# Patient Record
Sex: Female | Born: 1950
Health system: Southern US, Community
[De-identification: ages and names within clinical notes are randomized; demographics above are authoritative.]

## PROBLEM LIST (undated history)

## (undated) DIAGNOSIS — Z87891 Personal history of nicotine dependence: Secondary | ICD-10-CM

## (undated) DIAGNOSIS — H9193 Unspecified hearing loss, bilateral: Secondary | ICD-10-CM

## (undated) DIAGNOSIS — J432 Centrilobular emphysema: Secondary | ICD-10-CM

## (undated) DIAGNOSIS — M8580 Other specified disorders of bone density and structure, unspecified site: Secondary | ICD-10-CM

## (undated) DIAGNOSIS — F32A Depression, unspecified: Secondary | ICD-10-CM

## (undated) DIAGNOSIS — C44300 Unspecified malignant neoplasm of skin of unspecified part of face: Secondary | ICD-10-CM

## (undated) DIAGNOSIS — N182 Chronic kidney disease, stage 2 (mild): Principal | ICD-10-CM

## (undated) DIAGNOSIS — G4700 Insomnia, unspecified: Secondary | ICD-10-CM

## (undated) DIAGNOSIS — K5900 Constipation, unspecified: Secondary | ICD-10-CM

## (undated) DIAGNOSIS — N189 Chronic kidney disease, unspecified: Secondary | ICD-10-CM

## (undated) DIAGNOSIS — K219 Gastro-esophageal reflux disease without esophagitis: Secondary | ICD-10-CM

## (undated) DIAGNOSIS — I700 Atherosclerosis of aorta: Secondary | ICD-10-CM

## (undated) DIAGNOSIS — F3341 Major depressive disorder, recurrent, in partial remission: Secondary | ICD-10-CM

## (undated) DIAGNOSIS — E785 Hyperlipidemia, unspecified: Secondary | ICD-10-CM

## (undated) DIAGNOSIS — F329 Major depressive disorder, single episode, unspecified: Secondary | ICD-10-CM

## (undated) HISTORY — DX: Unspecified malignant neoplasm of skin of unspecified part of face: C44.300

## (undated) HISTORY — DX: Centrilobular emphysema: J43.2

## (undated) HISTORY — DX: Atherosclerosis of aorta: I70.0

## (undated) HISTORY — DX: Major depressive disorder, recurrent, in partial remission: F33.41

## (undated) HISTORY — DX: Other specified disorders of bone density and structure, unspecified site: M85.80

## (undated) HISTORY — DX: Chronic kidney disease, stage 2 (mild): N18.2

## (undated) HISTORY — DX: Personal history of nicotine dependence: Z87.891

## (undated) HISTORY — DX: Unspecified hearing loss, bilateral: H91.93

## (undated) HISTORY — DX: Insomnia, unspecified: G47.00

---

## 1976-08-07 HISTORY — PX: BACK SURGERY: SHX140

## 1978-08-07 HISTORY — PX: ABDOMINAL HYSTERECTOMY: SHX81

## 2011-08-08 DIAGNOSIS — C44300 Unspecified malignant neoplasm of skin of unspecified part of face: Secondary | ICD-10-CM

## 2011-08-08 HISTORY — DX: Unspecified malignant neoplasm of skin of unspecified part of face: C44.300

## 2011-08-08 HISTORY — PX: TONSILLECTOMY: SUR1361

## 2011-08-08 HISTORY — PX: INNER EAR SURGERY: SHX679

## 2012-06-12 ENCOUNTER — Ambulatory Visit: Payer: Self-pay | Admitting: Family

## 2012-06-17 ENCOUNTER — Ambulatory Visit: Payer: Self-pay | Admitting: Otolaryngology

## 2012-09-13 ENCOUNTER — Ambulatory Visit: Payer: Self-pay | Admitting: Family

## 2012-11-01 ENCOUNTER — Ambulatory Visit: Payer: Self-pay | Admitting: Otolaryngology

## 2012-11-14 ENCOUNTER — Ambulatory Visit: Payer: Self-pay | Admitting: Otolaryngology

## 2013-03-20 ENCOUNTER — Ambulatory Visit: Payer: Self-pay | Admitting: Otolaryngology

## 2013-04-02 ENCOUNTER — Ambulatory Visit: Payer: Self-pay | Admitting: Otolaryngology

## 2013-04-03 LAB — PATHOLOGY REPORT

## 2013-06-13 ENCOUNTER — Ambulatory Visit: Payer: Self-pay | Admitting: Internal Medicine

## 2013-07-07 ENCOUNTER — Ambulatory Visit: Payer: Self-pay | Admitting: Family

## 2013-10-16 ENCOUNTER — Ambulatory Visit: Payer: Self-pay | Admitting: Otolaryngology

## 2014-06-16 ENCOUNTER — Ambulatory Visit: Payer: Self-pay | Admitting: Internal Medicine

## 2014-07-21 ENCOUNTER — Ambulatory Visit: Payer: Self-pay | Admitting: Family Medicine

## 2014-10-19 ENCOUNTER — Ambulatory Visit: Payer: Self-pay | Admitting: Otolaryngology

## 2014-11-27 NOTE — Op Note (Signed)
PATIENT NAME:  Robin Jones, Robin Jones MR#:  [id number] DATE OF BIRTH:  [birth year]  DATE OF PROCEDURE:  04/02/2013  PREOPERATIVE DIAGNOSIS: Left cholesteatoma with chronic suppurative otitis.   POSTOPERATIVE DIAGNOSIS: Left cholesteatoma with chronic suppurative otitis.   PROCEDURE: Left tympanoplasty with ossicular chain reconstruction and removal of cholesteatoma.   SURGEON: Malon Kindle, MD.   ANESTHESIA: General endotracheal.   INDICATIONS: A 64 year old female with a history of left cholesteatoma and persistently draining ear.   FINDINGS: The patient had a small retraction pocket posteriorly and superiorly. There appeared to be a small atticotomy defect where she had previous surgery potentially. The malleus was either eroded or had been partially removed although the head of the malleus remained. All of the cholesteatoma was noted anteriorly in the middle ear anterior to the head of the malleus and just anterior to the stapes as well as in the region of the protympanum. This required removal of the incus and malleus for complete removal of the cholesteatoma itself. The ossicles were reconstructed with an incus transposition graft.   COMPLICATIONS: None.   DESCRIPTION OF PROCEDURE: After obtaining informed consent, the patient was taken to the operating room and placed in the supine position. After induction of general endotracheal anesthesia, the patient was turned 90 degrees. The facial nerve monitor electrodes were placed in the usual fashion and proper functioning confirmed. Lidocaine 1% with epinephrine 1:100,000 was injected into the canal skin as well as in the postauricular crease region. She was then prepped and draped in the usual sterile fashion. The left ear was evaluated under the operating microscope. Canal incisions were made at 12 and 6 o'clock vertically followed by a horizontal canal incision about 4 mm behind the annulus. The posterior canal flap was elevated down to the annulus. A 15  blade was used to incise the skin posteriorly carrying the incision down to the mastoid cortex. Temporalis fascia was harvested and set aside to be pressed and dried for later use as a graft. The periosteum was then elevated off the mastoid with a Lempert elevator and dissection proceeded down to the canal using a Soil scientist until the previously made canal cuts were encountered. The ear was then retracted forward using a Penrose drain and a self retainer. A Lempert  was then used to further elevate the posterior canal flap and the annulus. A Rosen needle was also used to aid in elevating the annulus posteriorly. A Whirlybird was then used to elevate the tympanic membrane remnant out of the middle ear carefully dissecting it away from the incus and stapes complex to which it was scarred as well as the chorda tympani nerve. Just anterior to the incus stapes complex there was cholesteatoma. The long process of the malleus was either eroded or had been previously removed. Dissection proceeded more anteriorly into the protympanum and mesotympanum where there was severe  retraction of the tympanic membrane which had settled into nooks and crannies in these regions. It was clear that there was disease anterior to the malleus and incus complex so the decision was made to remove the incus and the head of the malleus. The incudostapedial joint was divided with a joint knife and the incus separated from the head of the malleus with a right angle pick. The incus was then removed to be preserved for use as a graft. The malleus head was separated from the tensor tympani tendon and removed. Further cholesteatoma, was then dissected out of the anterior aspect of the epitympanum. Meticulous efforts were  made to remove all skin from the middle ear particularly anteriorly and in the region of the eustachian tube where the tympanic membrane was retracted severely. There was no usable tympanic membrane remnant anteriorly. The  decision was made to elevate the anterior canal skin making a horizontal incision anteriorly in the skin just before the curvature of the canal and elevating the skin down to the annulus. A canaloplasty was then performed to provide better visualization of the anterior canal angle using a #3 Diamond bur. Care was taken not to enter the temporomandibular joint. With the anterior canal angle better visualize, the annulus was raised anteriorly to allow a graft to be pulled up under the annulus. The temporalis fascia graft was then prepared and placed in position, sliding it up anterior to the annulus along the anterior canal wall so that the annulus would hold it in position anteriorly. A piece of concha cartilage was then harvested and used to bolster anteriorly so that there would not be further development of retraction pockets in this area. The incus was reshaped removing much of the long process of the incus and creating a small socket for the head of the stapes. The incus was then placed over the head of the stapes to provide good contact between the stapes and we reconstructed tympanic membrane. A small amount of cartilage was also placed in the attic defect to prevent further retraction and there. The middle ear was then carefully packed with Floxin moistened Gelfoam making sure that the incus transposition graft remained in good position. The tympanic membrane remnant and grafts were then placed into anatomic position and packing placed lateral to the tympanic membrane. A small piece of Gelfilm was placed first to try to reduce issues with blunting at the anterior canal angle. The anterior canal skin was placed back into anatomic position as was the posterior canal skin and the canal completely packed with Gelfoam followed by bacitracin ointment. The posterior auricular incision was closed with 4-0 Vicryl suture for the deep closure and 50 fast absorbing gut suture in a running lock stitch for the skin. Next,  a Glasscock dressing was placed. The patient was then returned to the anesthesiologist for awakening. She was awakened and taken to the recovery room in good condition postoperatively. Blood loss was minimal.    ____________________________ Sammuel Hines. Richardson Landry, MD psb:dp D: 04/02/2013 11:17:50 ET T: 04/02/2013 11:59:45 ET JOB#: [id number]  cc: Sammuel Hines. Richardson Landry, MD, <Dictator> Riley Nearing MD ELECTRONICALLY SIGNED 04/09/2013 17:35

## 2014-12-22 ENCOUNTER — Ambulatory Visit: Payer: Medicare Other | Admitting: Anesthesiology

## 2014-12-22 ENCOUNTER — Ambulatory Visit
Admission: RE | Admit: 2014-12-22 | Discharge: 2014-12-22 | Disposition: A | Discharge status: Home / Self Care | Payer: Medicare Other | Source: Ambulatory Visit | Attending: Gastroenterology | Admitting: Gastroenterology

## 2014-12-22 ENCOUNTER — Encounter
Admission: RE | Disposition: A | Discharge status: Home / Self Care | Payer: Self-pay | Source: Ambulatory Visit | Attending: Gastroenterology

## 2014-12-22 ENCOUNTER — Encounter: Payer: Self-pay | Admitting: *Deleted

## 2014-12-22 DIAGNOSIS — K5730 Diverticulosis of large intestine without perforation or abscess without bleeding: Secondary | ICD-10-CM | POA: Diagnosis not present

## 2014-12-22 DIAGNOSIS — I129 Hypertensive chronic kidney disease with stage 1 through stage 4 chronic kidney disease, or unspecified chronic kidney disease: Secondary | ICD-10-CM | POA: Diagnosis not present

## 2014-12-22 DIAGNOSIS — K640 First degree hemorrhoids: Secondary | ICD-10-CM | POA: Insufficient documentation

## 2014-12-22 DIAGNOSIS — F329 Major depressive disorder, single episode, unspecified: Secondary | ICD-10-CM | POA: Diagnosis not present

## 2014-12-22 DIAGNOSIS — D124 Benign neoplasm of descending colon: Secondary | ICD-10-CM | POA: Diagnosis not present

## 2014-12-22 DIAGNOSIS — Z1211 Encounter for screening for malignant neoplasm of colon: Secondary | ICD-10-CM | POA: Diagnosis present

## 2014-12-22 DIAGNOSIS — J449 Chronic obstructive pulmonary disease, unspecified: Secondary | ICD-10-CM | POA: Diagnosis not present

## 2014-12-22 DIAGNOSIS — K219 Gastro-esophageal reflux disease without esophagitis: Secondary | ICD-10-CM | POA: Insufficient documentation

## 2014-12-22 DIAGNOSIS — K5900 Constipation, unspecified: Secondary | ICD-10-CM | POA: Diagnosis not present

## 2014-12-22 DIAGNOSIS — F17210 Nicotine dependence, cigarettes, uncomplicated: Secondary | ICD-10-CM | POA: Diagnosis not present

## 2014-12-22 DIAGNOSIS — E785 Hyperlipidemia, unspecified: Secondary | ICD-10-CM | POA: Diagnosis not present

## 2014-12-22 DIAGNOSIS — D122 Benign neoplasm of ascending colon: Secondary | ICD-10-CM | POA: Diagnosis not present

## 2014-12-22 HISTORY — DX: Depression, unspecified: F32.A

## 2014-12-22 HISTORY — DX: Gastro-esophageal reflux disease without esophagitis: K21.9

## 2014-12-22 HISTORY — DX: Chronic kidney disease, unspecified: N18.9

## 2014-12-22 HISTORY — PX: COLONOSCOPY: SHX5424

## 2014-12-22 HISTORY — DX: Constipation, unspecified: K59.00

## 2014-12-22 HISTORY — DX: Hyperlipidemia, unspecified: E78.5

## 2014-12-22 HISTORY — DX: Major depressive disorder, single episode, unspecified: F32.9

## 2014-12-22 SURGERY — COLONOSCOPY
Anesthesia: General

## 2014-12-22 MED ORDER — SODIUM CHLORIDE 0.9 % IV SOLN
INTRAVENOUS | Status: DC
  Administered 2014-12-22: 09:00:00 via INTRAVENOUS

## 2014-12-22 MED ORDER — FENTANYL CITRATE (PF) 100 MCG/2ML IJ SOLN: 25.0000 ug | INTRAMUSCULAR | Status: DC | PRN

## 2014-12-22 MED ORDER — PROPOFOL 10 MG/ML IV BOLUS
INTRAVENOUS | Status: DC | PRN
  Administered 2014-12-22: 50 mg via INTRAVENOUS
  Administered 2014-12-22: 150 mg via INTRAVENOUS

## 2014-12-22 MED ORDER — ONDANSETRON HCL 4 MG/2ML IJ SOLN
4.0000 mg | Freq: Once | INTRAMUSCULAR | Status: AC
  Administered 2014-12-22: 4 mg via INTRAVENOUS

## 2014-12-22 MED ORDER — SUCCINYLCHOLINE CHLORIDE 20 MG/ML IJ SOLN
INTRAMUSCULAR | Status: DC | PRN
  Administered 2014-12-22: 80 mg via INTRAVENOUS

## 2014-12-22 MED ORDER — MIDAZOLAM HCL 2 MG/2ML IJ SOLN
INTRAMUSCULAR | Status: DC | PRN
  Administered 2014-12-22: 1 mg via INTRAVENOUS

## 2014-12-22 MED ORDER — FENTANYL CITRATE (PF) 100 MCG/2ML IJ SOLN
INTRAMUSCULAR | Status: DC | PRN
  Administered 2014-12-22: 100 ug via INTRAVENOUS

## 2014-12-22 MED ORDER — ONDANSETRON HCL 4 MG/2ML IJ SOLN: 4.0000 mg | Freq: Once | INTRAMUSCULAR | Status: DC | PRN

## 2014-12-22 MED ORDER — ONDANSETRON HCL 4 MG/2ML IJ SOLN
INTRAMUSCULAR | Status: DC | PRN
  Administered 2014-12-22: 4 mg via INTRAVENOUS

## 2014-12-22 MED ORDER — SODIUM CHLORIDE 0.9 % IV SOLN: INTRAVENOUS | Status: DC

## 2014-12-22 NOTE — Anesthesia Postprocedure Evaluation (Signed)
  Anesthesia Post-op Note  Patient: Robin Jones  Procedure(s) Performed: Procedure(s): COLONOSCOPY (N/A)  Anesthesia type:General  Patient location: PACU  Post pain: Pain level controlled  Post assessment: Post-op Vital signs reviewed, Patient's Cardiovascular Status Stable, Respiratory Function Stable, Patent Airway and No signs of Nausea or vomiting  Post vital signs: Reviewed and stable  Last Vitals:  Filed Vitals:   12/22/14 1048  BP: 116/61  Pulse: 75  Temp:   Resp: 27    Level of consciousness: awake, alert  and patient cooperative  Complications: No apparent anesthesia complications

## 2014-12-22 NOTE — Anesthesia Procedure Notes (Signed)
Procedure Name: Intubation Performed by: Jonna Clark Pre-anesthesia Checklist: Patient identified, Suction available, Emergency Drugs available, Patient being monitored and Timeout performed Patient Re-evaluated:Patient Re-evaluated prior to inductionOxygen Delivery Method: Circle system utilized Preoxygenation: Pre-oxygenation with 100% oxygen Intubation Type: IV induction Ventilation: Mask ventilation without difficulty Laryngoscope Size: Mac and 3 Grade View: Grade I Tube size: 7.0 mm Number of attempts: 1 Placement Confirmation: ETT inserted through vocal cords under direct vision,  positive ETCO2 and breath sounds checked- equal and bilateral Secured at: 21 cm Tube secured with: Tape Dental Injury: Teeth and Oropharynx as per pre-operative assessment

## 2014-12-22 NOTE — H&P (Deleted)
  [facility]  [address] [place], [place] [address] Phone: [phone] Fax : [phone]  Primary Care Physician:  Bobetta Lime, MD Primary Gastroenterologist:  Dr. Allen Norris  Pre-Procedure History & Physical: HPI:  Robin Jones is a 64 y.o. female is here for an endoscopy and colonoscopy.   Past Medical History  Diagnosis Date  . Hypertension   . Depression   . GERD (gastroesophageal reflux disease)   . Chronic kidney disease   . Constipation   . Hyperlipidemia     Past Surgical History  Procedure Laterality Date  . Abdominal hysterectomy    . Back surgery    . Tonsillectomy      Prior to Admission medications   Medication Sig Start Date End Date Taking? Authorizing Provider  acetaminophen (TYLENOL) 325 MG tablet Take 650 mg by mouth every 6 (six) hours as needed for moderate pain or headache.   Yes Historical Provider, MD  fexofenadine (ALLEGRA) 180 MG tablet Take 180 mg by mouth daily.   Yes Historical Provider, MD  fluticasone (FLONASE) 50 MCG/ACT nasal spray Place 2 sprays into both nostrils daily.   Yes Historical Provider, MD  LORazepam (ATIVAN) 0.5 MG tablet Take 0.5 mg by mouth daily.   Yes Historical Provider, MD  lovastatin (MEVACOR) 40 MG tablet Take 40 mg by mouth at bedtime.   Yes Historical Provider, MD  omeprazole (PRILOSEC) 40 MG capsule Take 40 mg by mouth daily.   Yes Historical Provider, MD  sertraline (ZOLOFT) 50 MG tablet Take 50 mg by mouth daily.   Yes Historical Provider, MD    Allergies as of 11/18/2014  . (Not on File)    History reviewed. No pertinent family history.  History   Social History  . Marital Status: Divorced    Spouse Name: N/A  . Number of Children: N/A  . Years of Education: N/A   Occupational History  . Not on file.   Social History Main Topics  . Smoking status: Current Every Day Smoker -- 1.00 packs/day  . Smokeless tobacco: Never Used  . Alcohol Use: No  . Drug Use: No  . Sexual  Activity: Not on file   Other Topics Concern  . Not on file   Social History Narrative  . No narrative on file    Review of Systems: See HPI, otherwise negative ROS  Physical Exam: BP 157/84 mmHg  Pulse 85  Temp(Src) 97.5 F (36.4 C) (Tympanic)  Resp 14  Ht 5\' 4"  (1.626 m)  Wt 122 lb (55.339 kg)  BMI 20.93 kg/m2  SpO2 99% General:   Alert,  pleasant and cooperative in NAD Head:  Normocephalic and atraumatic. Neck:  Supple; no masses or thyromegaly. Lungs:  Clear throughout to auscultation.    Heart:  Regular rate and rhythm. Abdomen:  Soft, nontender and nondistended. Normal bowel sounds, without guarding, and without rebound.   Neurologic:  Alert and  oriented x4;  grossly normal neurologically.  Impression/Plan: Robin Jones is here for an endoscopy and colonoscopy to be performed for Weight loss and nausea  Risks, benefits, limitations, and alternatives regarding  endoscopy and colonoscopy have been reviewed with the patient.  Questions have been answered.  All parties agreeable.   [facility], MD  12/22/2014, 9:24 AM

## 2014-12-22 NOTE — H&P (Signed)
  [facility]  [address] [place], [place] [address] Phone: [phone] Fax : [phone]  Primary Care Physician:  Bobetta Lime, MD Primary Gastroenterologist:  Dr. Allen Norris  Pre-Procedure History & Physical: HPI:  Robin Jones is a 64 y.o. female is here for a screening colonoscopy.   Past Medical History  Diagnosis Date  . Hypertension   . Depression   . GERD (gastroesophageal reflux disease)   . Chronic kidney disease   . Constipation   . Hyperlipidemia     Past Surgical History  Procedure Laterality Date  . Abdominal hysterectomy    . Back surgery    . Tonsillectomy      Prior to Admission medications   Medication Sig Start Date End Date Taking? Authorizing Provider  acetaminophen (TYLENOL) 325 MG tablet Take 650 mg by mouth every 6 (six) hours as needed for moderate pain or headache.   Yes Historical Provider, MD  fexofenadine (ALLEGRA) 180 MG tablet Take 180 mg by mouth daily.   Yes Historical Provider, MD  fluticasone (FLONASE) 50 MCG/ACT nasal spray Place 2 sprays into both nostrils daily.   Yes Historical Provider, MD  LORazepam (ATIVAN) 0.5 MG tablet Take 0.5 mg by mouth daily.   Yes Historical Provider, MD  lovastatin (MEVACOR) 40 MG tablet Take 40 mg by mouth at bedtime.   Yes Historical Provider, MD  omeprazole (PRILOSEC) 40 MG capsule Take 40 mg by mouth daily.   Yes Historical Provider, MD  sertraline (ZOLOFT) 50 MG tablet Take 50 mg by mouth daily.   Yes Historical Provider, MD    Allergies as of 11/18/2014  . (Not on File)    History reviewed. No pertinent family history.  History   Social History  . Marital Status: Divorced    Spouse Name: N/A  . Number of Children: N/A  . Years of Education: N/A   Occupational History  . Not on file.   Social History Main Topics  . Smoking status: Current Every Day Smoker -- 1.00 packs/day  . Smokeless tobacco: Never Used  . Alcohol Use: No  . Drug Use: No  . Sexual Activity:  Not on file   Other Topics Concern  . Not on file   Social History Narrative  . No narrative on file    Review of Systems: See HPI, otherwise negative ROS  Physical Exam: BP 157/84 mmHg  Pulse 85  Temp(Src) 97.5 F (36.4 C) (Tympanic)  Resp 14  Ht 5\' 4"  (1.626 m)  Wt 122 lb (55.339 kg)  BMI 20.93 kg/m2  SpO2 99% General:   Alert,  pleasant and cooperative in NAD Head:  Normocephalic and atraumatic. Neck:  Supple; no masses or thyromegaly. Lungs:  Clear throughout to auscultation.    Heart:  Regular rate and rhythm. Abdomen:  Soft, nontender and nondistended. Normal bowel sounds, without guarding, and without rebound.   Neurologic:  Alert and  oriented x4;  grossly normal neurologically.  Impression/Plan: Robin Jones is now here to undergo a screening colonoscopy.  Risks, benefits, and alternatives regarding colonoscopy have been reviewed with the patient.  Questions have been answered.  All parties agreeable.

## 2014-12-22 NOTE — Op Note (Signed)
[facility] Patient Name: Robin Jones Procedure Date: 12/22/2014 9:23 AM MRN: [id number] Account #: [id number] Date of Birth: 1950/08/30 Admit Type: Outpatient Age: 64 Room: [facility] ENDO ROOM 4 Gender: Female Note Status: Finalized Procedure:         Colonoscopy Indications:       Screening for colorectal malignant neoplasm Providers:         Lucilla Lame, MD Referring MD:      Bobetta Lime (Referring MD) Medicines:         General Anesthesia Complications:     No immediate complications. Procedure:         Pre-Anesthesia Assessment:                    - Prior to the procedure, a History and Physical was                     performed, and patient medications and allergies were                     reviewed. The patient's tolerance of previous anesthesia                     was also reviewed. The risks and benefits of the procedure                     and the sedation options and risks were discussed with the                     patient. All questions were answered, and informed consent                     was obtained. Prior Anticoagulants: The patient has taken                     no previous anticoagulant or antiplatelet agents. ASA                     Grade Assessment: III - A patient with severe systemic                     disease. After reviewing the risks and benefits, the                     patient was deemed in satisfactory condition to undergo                     the procedure.                    After obtaining informed consent, the colonoscope was                     passed under direct vision. Throughout the procedure, the                     patient's blood pressure, pulse, and oxygen saturations                     were monitored continuously. The Colonoscope was                     introduced through the anus and advanced to the the cecum,  identified by appendiceal orifice and ileocecal valve. The        colonoscopy was performed without difficulty. The patient                     tolerated the procedure well. The quality of the bowel                     preparation was excellent. Findings:      The perianal and digital rectal examinations were normal.      Multiple small-mouthed diverticula were found in the sigmoid colon.      Four sessile polyps were found in the ascending colon. The polyps were 3       to 6 mm in size. These polyps were removed with a cold biopsy forceps.       Resection and retrieval were complete.      A 8 mm polyp was found in the descending colon. The polyp was sessile.       The polyp was removed with a cold snare. Resection and retrieval were       complete.      Non-bleeding internal hemorrhoids were found during retroflexion. The       hemorrhoids were Grade I (internal hemorrhoids that do not prolapse). Impression:        - Diverticulosis in the sigmoid colon.                    - Four 3 to 6 mm polyps in the ascending colon. Resected                     and retrieved.                    - One 8 mm polyp in the descending colon. Resected and                     retrieved.                    - Non-bleeding internal hemorrhoids. Recommendation:    - Repeat colonoscopy in 5 years if polyp adenoma and 10                     years if hyperplastic Procedure Code(s): --- Professional ---                    [phone], Colonoscopy, flexible; with removal of tumor(s),                     polyp(s), or other lesion(s) by snare technique                    62130, 82, Colonoscopy, flexible; with biopsy, single or                     multiple Diagnosis Code(s): --- Professional ---                    Z12.11, Encounter for screening for malignant neoplasm of                     colon                    D12.2, Benign neoplasm of ascending colon  D12.4, Benign neoplasm of descending colon CPT copyright 2014 [employer]. All rights  reserved. The codes documented in this report are preliminary and upon coder review may  be revised to meet current compliance requirements. Lucilla Lame, MD 12/22/2014 10:06:57 AM This report has been signed electronically. Number of Addenda: 0 Note Initiated On: 12/22/2014 9:23 AM Scope Withdrawal Time: 0 hours 6 minutes 48 seconds  Total Procedure Duration: 0 hours 17 minutes 22 seconds       [facility]

## 2014-12-22 NOTE — Transfer of Care (Signed)
Immediate Anesthesia Transfer of Care Note  Patient: Robin Jones  Procedure(s) Performed: Procedure(s): COLONOSCOPY (N/A)  Patient Location: PACU  Anesthesia Type:General  Level of Consciousness: sedated and pateint uncooperative  Airway & Oxygen Therapy: Patient Spontanous Breathing and Patient connected to face mask oxygen  Post-op Assessment: Report given to RN and Post -op Vital signs reviewed and stable  Post vital signs: Reviewed and stable  Last Vitals:  Filed Vitals:   12/22/14 1018  BP: 117/63  Pulse:   Temp: 37 C  Resp: 12    Complications: No apparent anesthesia complications

## 2014-12-22 NOTE — Anesthesia Preprocedure Evaluation (Signed)
Anesthesia Evaluation  Patient identified by MRN, date of birth, ID band Patient awake    Reviewed: Allergy & Precautions, NPO status , Patient's Chart, lab work & pertinent test results  Airway Mallampati: II  TM Distance: >3 FB Neck ROM: Full    Dental  (+) Upper Dentures, Lower Dentures   Pulmonary COPD COPD inhaler, Current Smoker (1ppd),          Cardiovascular hypertension (off meds now),     Neuro/Psych Depression    GI/Hepatic GERD-  Medicated and Controlled,  Endo/Other    Renal/GU      Musculoskeletal   Abdominal   Peds  Hematology   Anesthesia Other Findings   Reproductive/Obstetrics                             Anesthesia Physical Anesthesia Plan  ASA: III  Anesthesia Plan: General   Post-op Pain Management:    Induction: Intravenous  Airway Management Planned: Oral ETT  Additional Equipment:   Intra-op Plan:   Post-operative Plan:   Informed Consent: I have reviewed the patients History and Physical, chart, labs and discussed the procedure including the risks, benefits and alternatives for the proposed anesthesia with the patient or authorized representative who has indicated his/her understanding and acceptance.     Plan Discussed with:   Anesthesia Plan Comments:         Anesthesia Quick Evaluation

## 2014-12-23 LAB — SURGICAL PATHOLOGY

## 2014-12-29 ENCOUNTER — Encounter: Payer: Self-pay | Admitting: Gastroenterology

## 2015-03-03 ENCOUNTER — Encounter: Payer: Self-pay | Admitting: Family Medicine

## 2015-03-03 ENCOUNTER — Ambulatory Visit (INDEPENDENT_AMBULATORY_CARE_PROVIDER_SITE_OTHER): Payer: Medicare Other | Admitting: Family Medicine

## 2015-03-03 VITALS — BP 118/70 | HR 77 | Temp 98.0°F | Resp 16 | Ht 65.0 in | Wt 116.9 lb

## 2015-03-03 DIAGNOSIS — N182 Chronic kidney disease, stage 2 (mild): Secondary | ICD-10-CM

## 2015-03-03 DIAGNOSIS — M159 Polyosteoarthritis, unspecified: Secondary | ICD-10-CM | POA: Insufficient documentation

## 2015-03-03 DIAGNOSIS — E0789 Other specified disorders of thyroid: Secondary | ICD-10-CM | POA: Insufficient documentation

## 2015-03-03 DIAGNOSIS — Z9181 History of falling: Secondary | ICD-10-CM

## 2015-03-03 DIAGNOSIS — Z0000 Encounter for general adult medical examination without abnormal findings: Secondary | ICD-10-CM | POA: Diagnosis not present

## 2015-03-03 DIAGNOSIS — Z720 Tobacco use: Secondary | ICD-10-CM | POA: Insufficient documentation

## 2015-03-03 DIAGNOSIS — L989 Disorder of the skin and subcutaneous tissue, unspecified: Secondary | ICD-10-CM | POA: Insufficient documentation

## 2015-03-03 DIAGNOSIS — D126 Benign neoplasm of colon, unspecified: Secondary | ICD-10-CM | POA: Diagnosis not present

## 2015-03-03 DIAGNOSIS — Z23 Encounter for immunization: Secondary | ICD-10-CM | POA: Insufficient documentation

## 2015-03-03 DIAGNOSIS — F17210 Nicotine dependence, cigarettes, uncomplicated: Secondary | ICD-10-CM

## 2015-03-03 DIAGNOSIS — F3341 Major depressive disorder, recurrent, in partial remission: Secondary | ICD-10-CM

## 2015-03-03 DIAGNOSIS — E441 Mild protein-calorie malnutrition: Secondary | ICD-10-CM | POA: Diagnosis not present

## 2015-03-03 DIAGNOSIS — Z13820 Encounter for screening for osteoporosis: Secondary | ICD-10-CM | POA: Insufficient documentation

## 2015-03-03 DIAGNOSIS — K219 Gastro-esophageal reflux disease without esophagitis: Secondary | ICD-10-CM | POA: Insufficient documentation

## 2015-03-03 DIAGNOSIS — E785 Hyperlipidemia, unspecified: Secondary | ICD-10-CM | POA: Insufficient documentation

## 2015-03-03 DIAGNOSIS — M150 Primary generalized (osteo)arthritis: Secondary | ICD-10-CM | POA: Diagnosis not present

## 2015-03-03 DIAGNOSIS — K5909 Other constipation: Secondary | ICD-10-CM | POA: Insufficient documentation

## 2015-03-03 DIAGNOSIS — J449 Chronic obstructive pulmonary disease, unspecified: Secondary | ICD-10-CM | POA: Insufficient documentation

## 2015-03-03 HISTORY — DX: Major depressive disorder, recurrent, in partial remission: F33.41

## 2015-03-03 HISTORY — DX: Chronic kidney disease, stage 2 (mild): N18.2

## 2015-03-03 NOTE — Progress Notes (Signed)
Name: Robin Jones   MRN: [id number]    DOB: [birth year]   Date:03/03/2015       Progress Note  Subjective  Chief Complaint  Chief Complaint  Patient presents with  . Annual Exam    HPI  Patient is here today for a Female Medicare Wellness Visit:  She has had an accidental fall recently and feels that her right leg is weak or gives way sometimes. Interested in PT and any other preventative measures recommended. The patient has been in otherwise good general health and voices no acute concerns today.   Past Medical History  Diagnosis Date  . Hypertension   . Depression   . GERD (gastroesophageal reflux disease)   . Chronic kidney disease   . Constipation   . Hyperlipidemia     Past Surgical History  Procedure Laterality Date  . Abdominal hysterectomy    . Back surgery    . Tonsillectomy    . Colonoscopy N/A 12/22/2014    Procedure: COLONOSCOPY;  Surgeon: Lucilla Lame, MD;  Location: [facility] ENDOSCOPY;  Service: Endoscopy;  Laterality: N/A;  . Inner ear surgery Left     Family History  Problem Relation Age of Onset  . Heart disease Mother   . Thyroid disease Father   . Hepatitis C Sister   . Thyroid disease Sister   . Cancer Brother   . Heart disease Brother     History   Social History  . Marital Status: Divorced    Spouse Name: N/A  . Number of Children: N/A  . Years of Education: N/A   Occupational History  . Not on file.   Social History Main Topics  . Smoking status: Current Every Day Smoker -- 1.00 packs/day  . Smokeless tobacco: Never Used  . Alcohol Use: No  . Drug Use: No  . Sexual Activity: No   Other Topics Concern  . Not on file   Social History Narrative     Current outpatient prescriptions:  .  acetaminophen (TYLENOL) 325 MG tablet, Take 650 mg by mouth every 6 (six) hours as needed for moderate pain or headache., Disp: , Rfl:  .  ADVAIR DISKUS 250-50 MCG/DOSE AEPB, , Disp: , Rfl: 0 .  fexofenadine (ALLEGRA) 180 MG tablet, Take 180 mg  by mouth daily., Disp: , Rfl:  .  fluticasone (FLONASE) 50 MCG/ACT nasal spray, Place 2 sprays into both nostrils daily., Disp: , Rfl:  .  GAVILYTE-N WITH FLAVOR PACK 420 G solution, , Disp: , Rfl: 0 .  LORazepam (ATIVAN) 0.5 MG tablet, Take 0.5 mg by mouth daily., Disp: , Rfl:  .  lovastatin (MEVACOR) 40 MG tablet, Take 40 mg by mouth at bedtime., Disp: , Rfl:  .  omeprazole (PRILOSEC) 40 MG capsule, Take 40 mg by mouth daily., Disp: , Rfl:  .  PROAIR HFA 108 (90 BASE) MCG/ACT inhaler, , Disp: , Rfl: 0 .  sertraline (ZOLOFT) 100 MG tablet, , Disp: , Rfl: 0 .  sertraline (ZOLOFT) 50 MG tablet, Take 50 mg by mouth daily., Disp: , Rfl:   No Known Allergies  Fall Risk: Fall Risk  03/03/2015  Falls in the past year? Yes  Number falls in past yr: 1  Injury with Fall? Yes    Depression screen PHQ 2/9 03/03/2015  Decreased Interest 0  Down, Depressed, Hopeless 0  PHQ - 2 Score 0   Functional Status Survey: Is the patient deaf or have difficulty hearing?: Yes Does the patient have difficulty seeing,  even when wearing glasses/contacts?: No Does the patient have difficulty concentrating, remembering, or making decisions?: No Does the patient have difficulty walking or climbing stairs?: No Does the patient have difficulty dressing or bathing?: No Does the patient have difficulty doing errands alone such as visiting a doctor's office or shopping?: No   ROS  CONSTITUTIONAL: No significant weight changes, fever, chills, weakness or fatigue.  HEENT:  - Eyes: No visual changes.  - Ears: No auditory changes. No pain.  - Nose: No sneezing, congestion, runny nose. - Throat: No sore throat. No changes in swallowing. SKIN: No rash or itching.  CARDIOVASCULAR: No chest pain, chest pressure or chest discomfort. No palpitations or edema.  RESPIRATORY: No shortness of breath, cough or sputum.  GASTROINTESTINAL: No anorexia, nausea, vomiting. No changes in bowel habits. No abdominal pain or blood.   GENITOURINARY: No dysuria. No frequency. No discharge.  NEUROLOGICAL: No headache, dizziness, syncope, paralysis, ataxia, numbness or tingling in the extremities. No memory changes. No change in bowel or bladder control.  MUSCULOSKELETAL: Yes joint pain. No muscle pain. HEMATOLOGIC: No anemia, bleeding or bruising.  LYMPHATICS: No enlarged lymph nodes.  PSYCHIATRIC: No change in mood. No change in sleep pattern.  ENDOCRINOLOGIC: No reports of sweating, cold or heat intolerance. No polyuria or polydipsia.   Objective  Filed Vitals:   03/03/15 1142  BP: 118/70  Pulse: 77  Temp: 98 F (36.7 C)  TempSrc: Oral  Resp: 16  Height: 5\' 5"  (1.651 m)  Weight: 116 lb 14.4 oz (53.025 kg)  SpO2: 97%   Body mass index is 19.45 kg/(m^2).  Physical Exam  Constitutional: Patient is thin. In no distress.  HEENT:  - Head: Normocephalic and atraumatic.  - Ears: Bilateral TMs gray, no erythema or effusion - Nose: Nasal mucosa moist - Mouth/Throat: Oropharynx is clear and moist. No tonsillar hypertrophy or erythema. No post nasal drainage.  - Eyes: Conjunctivae clear, EOM movements normal. PERRLA. No scleral icterus.  Neck: Normal range of motion. Neck supple. No JVD present. No thyromegaly present.  Cardiovascular: Normal rate, regular rhythm and normal heart sounds.  No murmur heard.  Pulmonary/Chest: Effort normal and breath sounds normal. No respiratory distress. Breast exam bilateral normal with no masses, skin dimpling or nipple discharge.  Musculoskeletal: Normal range of motion bilateral UE and LE, no joint effusions. Muscle wasting generalized with weak quadriceps.  Peripheral vascular: Bilateral LE no edema. Neurological: CN II-XII grossly intact with no focal deficits. Alert and oriented to person, place, and time. Coordination, balance, strength, speech and gait are normal.  Skin: Skin is warm and dry. No rash noted. No erythema.  Psychiatric: Patient has a normal mood and affect.  Behavior is normal in office today. Judgment and thought content normal in office today.   Assessment & Plan  1. Medicare annual wellness visit, subsequent Functional ability/safety issues: Addressed, will refer to PT for strength, gait, balance training. Hearing issues: Addressed  Activities of daily living: Discussed Home safety issues: No Issues  End Of Life Planning: Offered verbal information regarding advanced directives, healthcare power of attorney.  Preventative care, Health maintenance, Preventative health measures discussed.  Preventative screenings discussed today: lab work, colonoscopy, PAP, mammogram, DEXA.  Low Dose CT Chest recommended if Age 44-80 years, 30 pack-year currently smoking OR have quit w/in 15years.   Lifestyle risk factor issued reviewed: Diet, exercise, weight management, advised patient smoking is not healthy, nutrition/diet.  Preventative health measures discussed (5-10 year plan).  Reviewed and recommended vaccinations: - Pneumovax  -  Prevnar: Given today - Annual Influenza - Zostavax: Patient can not afford it - Tdap: Given today  Depression screening: Done Fall risk screening: Done Discuss ADLs/IADLs: Done  Current medical providers: See HPI  Other health risk factors identified this visit: No other issues Cognitive impairment issues: None identified  All above discussed with patient. Appropriate education, counseling and referral will be made based upon the above.     2. Primary osteoarthritis involving multiple joints  - DG Bone Density; Future - Ambulatory referral to Physical Therapy  3. At risk for falls  - Ambulatory referral to Physical Therapy  4. Need for immunization with diphtheria, tetanus, and poliovirus vaccine  - Tdap vaccine greater than or equal to 7yo IM  5. Need for pneumococcal vaccination  - Pneumococcal conjugate vaccine 13-valent  6. Heavy cigarette smoker (20-39 per day) The patient has been  counseled on smoking cessation benefits, goals, strategies and available over the counter and prescription medications that may help them in their efforts.  Options discussed include Nicoderm patches, Wellbutrin and Chantix.  The patient voices understanding their increased risk of cardiovascular and pulmonary diseases with continued use of tobacco products.  - CT CHEST LUNG CA SCREEN LOW DOSE W/O CM; Future  7. Screening for osteoporosis  - DG Bone Density; Future  8. Polyp of colon, adenomatous Needs repeat C-scope 5 years  12. Protein-calorie malnutrition, mild Instructed patient on increasing caloric intake, Boost or Ensure shakes.  - Ambulatory referral to Physical Therapy

## 2015-03-09 ENCOUNTER — Encounter: Payer: Self-pay | Admitting: Physical Therapy

## 2015-03-09 ENCOUNTER — Ambulatory Visit: Payer: Medicare Other | Attending: Family Medicine | Admitting: Physical Therapy

## 2015-03-09 DIAGNOSIS — R262 Difficulty in walking, not elsewhere classified: Secondary | ICD-10-CM | POA: Diagnosis present

## 2015-03-09 DIAGNOSIS — R29898 Other symptoms and signs involving the musculoskeletal system: Secondary | ICD-10-CM | POA: Insufficient documentation

## 2015-03-09 DIAGNOSIS — M25561 Pain in right knee: Secondary | ICD-10-CM | POA: Diagnosis not present

## 2015-03-09 NOTE — Patient Instructions (Signed)
FLEXION: Sitting - Exercise Ball: Resistance Band (Active)   Sit, both feet flat. Against yellow resistance band, lift right knee toward ceiling. Complete _2__ sets of _10__ repetitions. Perform __2_ sessions per day.  http://gtsc.exer.us/23   Copyright  VHI. All rights reserved.  Hip Flexion / Knee Extension: Straight-Leg Raise (Eccentric)   Lie on back. Lift leg with knee straight. Slowly lower leg for 3-5 seconds. _10__ reps per set, _2__ sets per day, __5_ days per week. Lower like elevator, stopping at each floor.  Copyright  VHI. All rights reserved.  Hip Flexor Stretch - Standing   Right leg behind, foot facing forward, left knee bent . Place hands on wall. Bend right knee directly under hip. Squeeze buttock muscles and push hips forward. Hold position for 20 seconds Repeat _3__ times. Repeat with other leg. Do __2_ times per day.  Copyright  VHI. All rights reserved.

## 2015-03-10 DIAGNOSIS — M25561 Pain in right knee: Secondary | ICD-10-CM | POA: Diagnosis not present
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
## 2015-03-11 ENCOUNTER — Encounter: Payer: Medicare Other | Admitting: Physical Therapy

## 2015-03-15 ENCOUNTER — Encounter: Payer: Medicare Other | Admitting: Physical Therapy

## 2015-03-17 ENCOUNTER — Ambulatory Visit: Payer: Medicare Other | Admitting: Physical Therapy

## 2015-03-18 ENCOUNTER — Telehealth: Payer: Self-pay | Admitting: Family Medicine

## 2015-03-18 ENCOUNTER — Encounter: Payer: Medicare Other | Admitting: Physical Therapy

## 2015-03-18 NOTE — Telephone Encounter (Signed)
Patient did schedule an appointment with you for tomorrow, however, she is having severe right side pain and is asking if you are able to work her in possibly today. The other doctors had nothing available.

## 2015-03-18 NOTE — Telephone Encounter (Signed)
Please disreguard the previous message. Patient has decided to keep appointment for tomorrow due to having another appointment this afternoon.

## 2015-03-19 ENCOUNTER — Ambulatory Visit
Admission: RE | Admit: 2015-03-19 | Discharge: 2015-03-19 | Disposition: A | Discharge status: Home / Self Care | Payer: Medicare Other | Source: Ambulatory Visit | Attending: Family Medicine | Admitting: Family Medicine

## 2015-03-19 ENCOUNTER — Encounter: Payer: Self-pay | Admitting: Family Medicine

## 2015-03-19 ENCOUNTER — Ambulatory Visit (INDEPENDENT_AMBULATORY_CARE_PROVIDER_SITE_OTHER): Payer: Medicare Other | Admitting: Family Medicine

## 2015-03-19 VITALS — BP 118/76 | HR 105 | Temp 98.1°F | Resp 18 | Wt 113.5 lb

## 2015-03-19 DIAGNOSIS — R109 Unspecified abdominal pain: Secondary | ICD-10-CM | POA: Diagnosis not present

## 2015-03-19 DIAGNOSIS — F3340 Major depressive disorder, recurrent, in remission, unspecified: Secondary | ICD-10-CM | POA: Diagnosis not present

## 2015-03-19 DIAGNOSIS — K5900 Constipation, unspecified: Secondary | ICD-10-CM | POA: Diagnosis not present

## 2015-03-19 DIAGNOSIS — N3090 Cystitis, unspecified without hematuria: Secondary | ICD-10-CM | POA: Diagnosis not present

## 2015-03-19 LAB — POCT URINALYSIS DIPSTICK
Bilirubin, UA: NEGATIVE
Ketones, UA: NEGATIVE
Leukocytes, UA: NEGATIVE
Nitrite, UA: NEGATIVE
SPEC GRAV UA: 1.015
UROBILINOGEN UA: 1
pH, UA: 6.5

## 2015-03-19 MED ORDER — TIZANIDINE HCL 4 MG PO TABS: 4.0000 mg | ORAL_TABLET | Freq: Three times a day (TID) | ORAL | Status: DC | PRN

## 2015-03-19 MED ORDER — CEPHALEXIN 500 MG PO CAPS: 500.0000 mg | ORAL_CAPSULE | Freq: Two times a day (BID) | ORAL | Status: DC

## 2015-03-19 NOTE — Progress Notes (Signed)
Name: Robin Jones   MRN: [id number]    DOB: [birth year]   Date:03/19/2015       Progress Note  Subjective  Chief Complaint  Chief Complaint  Patient presents with  . Flank Pain    patient stated that she has been having right sided pain since sunday. It hurts mostly at night and when she sits or moves. She has taken some OTC Tylenol but ipain persists.     HPI  Flank Pain: Patient is here today with concerns regarding the following symptoms right sided flank pain for almost 1 week now starting about Sunday. She notes that it is tight and worse with any truncal movements. Not associated with dysuria, frequency, change in mentation, change in urinary color, nausea, vomiting, fevers.  She has had kidney stones many years ago. She recently started working with PT but has cut back due to this pain. Has tried Tylenol and NSAID therapy with minimal relief.   Anxiety: Patient complains of anxiety disorder.  She has the following symptoms: difficulty concentrating, fatigue, feelings of losing control, insomnia, racing thoughts. Onset of symptoms was approximately several years ago, stable since that time. She denies current suicidal and homicidal ideation. Family history significant for anxiety and depression.Possible organic causes contributing are: none. Risk factors: previous episode of depression Previous treatment includes SSRI, Ativan and medication.  She complains of the following side effects from the treatment: none.  She also wants to state that she has been needing her lorazepam 1-2 times a day now but she is not running out early.    Past Medical History  Diagnosis Date  . Hypertension   . Depression   . GERD (gastroesophageal reflux disease)   . Chronic kidney disease   . Constipation   . Hyperlipidemia     Social History  Substance Use Topics  . Smoking status: Current Every Day Smoker -- 1.00 packs/day  . Smokeless tobacco: Never Used  . Alcohol Use: No     Current  outpatient prescriptions:  .  acetaminophen (TYLENOL) 325 MG tablet, Take 650 mg by mouth every 6 (six) hours as needed for moderate pain or headache., Disp: , Rfl:  .  ADVAIR DISKUS 250-50 MCG/DOSE AEPB, , Disp: , Rfl: 0 .  fexofenadine (ALLEGRA) 180 MG tablet, Take 180 mg by mouth daily., Disp: , Rfl:  .  fluticasone (FLONASE) 50 MCG/ACT nasal spray, Place 2 sprays into both nostrils daily., Disp: , Rfl:  .  GAVILYTE-N WITH FLAVOR PACK 420 G solution, , Disp: , Rfl: 0 .  LORazepam (ATIVAN) 0.5 MG tablet, Take 0.5 mg by mouth daily., Disp: , Rfl:  .  lovastatin (MEVACOR) 40 MG tablet, Take 40 mg by mouth at bedtime., Disp: , Rfl:  .  omeprazole (PRILOSEC) 40 MG capsule, Take 40 mg by mouth daily., Disp: , Rfl:  .  PROAIR HFA 108 (90 BASE) MCG/ACT inhaler, , Disp: , Rfl: 0 .  sertraline (ZOLOFT) 100 MG tablet, , Disp: , Rfl: 0 .  sertraline (ZOLOFT) 50 MG tablet, Take 50 mg by mouth daily., Disp: , Rfl:   No Known Allergies  ROS  CONSTITUTIONAL: No significant weight changes, fever, chills, weakness or fatigue.  HEENT:  - Eyes: No visual changes.  - Ears: No auditory changes. No pain.  - Nose: No sneezing, congestion, runny nose. - Throat: No sore throat. No changes in swallowing. SKIN: No rash or itching.  CARDIOVASCULAR: No chest pain, chest pressure or chest discomfort. No palpitations or edema.  RESPIRATORY: No shortness of breath, cough or sputum.  GASTROINTESTINAL: No anorexia, nausea, vomiting. No changes in bowel habits. GENITOURINARY: No dysuria. No frequency. No discharge. NEUROLOGICAL: No headache, dizziness, syncope, paralysis, ataxia, numbness or tingling in the extremities. No memory changes. No change in bowel or bladder control.  MUSCULOSKELETAL: No joint pain. No muscle pain. HEMATOLOGIC: No anemia, bleeding or bruising.  LYMPHATICS: No enlarged lymph nodes.  PSYCHIATRIC: No change in mood. No change in sleep pattern.  ENDOCRINOLOGIC: No reports of sweating, cold or  heat intolerance. No polyuria or polydipsia.      Objective  Filed Vitals:   03/19/15 1025  BP: 118/76  Pulse: 105  Temp: 98.1 F (36.7 C)  TempSrc: Oral  Resp: 18  Weight: 113 lb 8 oz (51.483 kg)  SpO2: 98%   Body mass index is 18.89 kg/(m^2).   Results for orders placed or performed in visit on 03/19/15 (from the past 24 hour(s))  POCT urinalysis dipstick     Status: Abnormal   Collection Time: 03/19/15 10:35 AM  Result Value Ref Range   Color, UA AMBER    Clarity, UA DARK    Glucose, UA MEGATIVE    Bilirubin, UA NEGATIVE    Ketones, UA NEGATIVE    Spec Grav, UA 1.015    Blood, UA LARGE    pH, UA 6.5    Protein, UA TRACE    Urobilinogen, UA 1.0    Nitrite, UA NEGATIVE    Leukocytes, UA Negative Negative     Physical Exam  Constitutional: Patient is thin. In no acute distress. Cardiovascular: Normal rate, regular rhythm and normal heart sounds.  No murmur heard.  Pulmonary/Chest: Effort normal and breath sounds normal. No respiratory distress. Abdomen: Soft with normal bowel sounds, no tenderness on deep palpation over suprapubic area, right reproducible flank tenderness over muscle wall.  Genitourinary: Exam deferred. Peripheral vascular: Bilateral LE no edema. Neurological: CN II-XII grossly intact with no focal deficits. Alert and oriented to person, place, and time. Coordination, balance, strength, speech and gait are normal.  Skin: Skin is warm and dry. No rash noted. No erythema.  Psychiatric: Patient has a stable mood and affect. Behavior is normal in office today. Judgment and thought content normal in office today.   Assessment & Plan 1. Right flank pain Rule out kidney stone. Likely muscle tension due to working with PT. Instructed on home care and exercises.  - POCT urinalysis dipstick - CULTURE, URINE COMPREHENSIVE - tiZANidine (ZANAFLEX) 4 MG tablet; Take 1 tablet (4 mg total) by mouth every 8 (eight) hours as needed for muscle spasms.   Dispense: 30 tablet; Refill: 2 - cephALEXin (KEFLEX) 500 MG capsule; Take 1 capsule (500 mg total) by mouth 2 (two) times daily.  Dispense: 14 capsule; Refill: 0 - DG Abd 2 Views; Future  2. Cystitis Prophylactic coverage until urine culture returns.   - cephALEXin (KEFLEX) 500 MG capsule; Take 1 capsule (500 mg total) by mouth 2 (two) times daily.  Dispense: 14 capsule; Refill: 0  3. Depression, major, recurrent, in remission Stable.

## 2015-03-19 NOTE — Patient Instructions (Signed)
Flank Pain °Flank pain refers to pain that is located on the side of the body between the upper abdomen and the back. The pain may occur over a short period of time (acute) or may be long-term or reoccurring (chronic). It may be mild or severe. Flank pain can be caused by many things. °CAUSES  °Some of the more common causes of flank pain include: °· Muscle strains.   °· Muscle spasms.   °· A disease of your spine (vertebral disk disease).   °· A lung infection (pneumonia).   °· Fluid around your lungs (pulmonary edema).   °· A kidney infection.   °· Kidney stones.   °· A very painful skin rash caused by the chickenpox virus (shingles).   °· Gallbladder disease.   °HOME CARE INSTRUCTIONS  °Home care will depend on the cause of your pain. In general, °· Rest as directed by your caregiver. °· Drink enough fluids to keep your urine clear or pale yellow. °· Only take over-the-counter or prescription medicines as directed by your caregiver. Some medicines may help relieve the pain. °· Tell your caregiver about any changes in your pain. °· Follow up with your caregiver as directed. °SEEK IMMEDIATE MEDICAL CARE IF:  °· Your pain is not controlled with medicine.   °· You have new or worsening symptoms. °· Your pain increases.   °· You have abdominal pain.   °· You have shortness of breath.   °· You have persistent nausea or vomiting.   °· You have swelling in your abdomen.   °· You feel faint or pass out.   °· You have blood in your urine. °· You have a fever or persistent symptoms for more than 2-3 days. °· You have a fever and your symptoms suddenly get worse. °MAKE SURE YOU:  °· Understand these instructions. °· Will watch your condition. °· Will get help right away if you are not doing well or get worse. °Document Released: 09/14/2005 Document Revised: 04/17/2012 Document Reviewed: 03/07/2012 °ExitCare® Patient Information ©2015 [employer]. This information is not intended to replace advice given to you by your  health care provider. Make sure you discuss any questions you have with your health care provider. ° °

## 2015-03-23 ENCOUNTER — Encounter: Payer: Self-pay | Admitting: Family Medicine

## 2015-03-23 ENCOUNTER — Ambulatory Visit: Payer: Medicare Other | Admitting: Physical Therapy

## 2015-03-23 ENCOUNTER — Other Ambulatory Visit: Payer: Self-pay | Admitting: Family Medicine

## 2015-03-23 DIAGNOSIS — Z87891 Personal history of nicotine dependence: Secondary | ICD-10-CM

## 2015-03-23 HISTORY — DX: Personal history of nicotine dependence: Z87.891

## 2015-03-23 LAB — CULTURE, URINE COMPREHENSIVE

## 2015-03-25 ENCOUNTER — Encounter: Payer: Medicare Other | Admitting: Physical Therapy

## 2015-03-26 ENCOUNTER — Ambulatory Visit
Admission: RE | Admit: 2015-03-26 | Discharge: 2015-03-26 | Disposition: A | Discharge status: Home / Self Care | Payer: Medicare Other | Source: Ambulatory Visit | Attending: Family Medicine | Admitting: Family Medicine

## 2015-03-26 ENCOUNTER — Encounter: Payer: Self-pay | Admitting: Family Medicine

## 2015-03-26 ENCOUNTER — Inpatient Hospital Stay: Payer: Medicare Other | Attending: Family Medicine | Admitting: Family Medicine

## 2015-03-26 DIAGNOSIS — Z122 Encounter for screening for malignant neoplasm of respiratory organs: Secondary | ICD-10-CM

## 2015-03-26 DIAGNOSIS — Z87891 Personal history of nicotine dependence: Secondary | ICD-10-CM | POA: Diagnosis not present

## 2015-03-26 DIAGNOSIS — I2510 Atherosclerotic heart disease of native coronary artery without angina pectoris: Secondary | ICD-10-CM | POA: Insufficient documentation

## 2015-03-26 DIAGNOSIS — R911 Solitary pulmonary nodule: Secondary | ICD-10-CM | POA: Diagnosis not present

## 2015-03-26 DIAGNOSIS — J439 Emphysema, unspecified: Secondary | ICD-10-CM | POA: Insufficient documentation

## 2015-03-26 DIAGNOSIS — R918 Other nonspecific abnormal finding of lung field: Secondary | ICD-10-CM | POA: Insufficient documentation

## 2015-03-26 NOTE — Progress Notes (Signed)
In accordance with CMS guidelines, patient has meet eligibility criteria including age, absence of signs or symptoms of lung cancer, the specific calculation of cigarette smoking pack-years was 45 years and is a current smoker.   A shared decision-making session was conducted prior to the performance of CT scan. This includes one or more decision aids, includes benefits and harms of screening, follow-up diagnostic testing, over-diagnosis, false positive rate, and total radiation exposure.  Counseling on the importance of adherence to annual lung cancer LDCT screening, impact of co-morbidities, and ability or willingness to undergo diagnosis and treatment is imperative for compliance of the program.  Counseling on the importance of continued smoking cessation for former smokers; the importance of smoking cessation for current smokers and information about tobacco cessation interventions have been given to patient including the [place] at [facility], [address] quit [place], as well as Cancer Center specific smoking cessation programs.  Written order for lung cancer screening with LDCT has been given to the patient and any and all questions have been answered to the best of my abilities.   Yearly follow up will be scheduled by Shawn Perkins, Thoracic Navigator.   

## 2015-03-29 ENCOUNTER — Other Ambulatory Visit: Payer: Self-pay | Admitting: Family Medicine

## 2015-03-29 DIAGNOSIS — R911 Solitary pulmonary nodule: Secondary | ICD-10-CM | POA: Insufficient documentation

## 2015-03-29 DIAGNOSIS — J449 Chronic obstructive pulmonary disease, unspecified: Secondary | ICD-10-CM

## 2015-03-30 ENCOUNTER — Telehealth: Payer: Self-pay | Admitting: *Deleted

## 2015-03-30 ENCOUNTER — Encounter: Payer: Medicare Other | Admitting: Physical Therapy

## 2015-03-30 ENCOUNTER — Telehealth: Payer: Self-pay | Admitting: Family Medicine

## 2015-03-30 NOTE — Telephone Encounter (Signed)
Please call Burgess Estelle at Radiology and let him know I am agreeable to having [facility] consult with Dr. Nestor Lewandowsky. Do I need to place a referral? She has been referred to [facility] however we can delay or discontinue this. Please advise.

## 2015-03-30 NOTE — Telephone Encounter (Signed)
  Oncology Nurse Navigator Documentation    Navigator Encounter Type: Telephone;Screening (03/30/15 1600)               Notified patient of LDCT lung cancer screening results of Lung Rads 4B finding with recommendations and incidental findings as described below.  Patient verbalizes understanding. Patient reports that she has been notified of findings from primary care office and is to expect an appointment from pulmonology.  1. Centrilobular and paraseptal emphysema. 1.6 cm spiculated right upper lobe pulmonary nodule. Given its location at the inferior aspect of pleural-parenchymal scarring, this could represent nodular scarring or a concurrent primary bronchogenic carcinoma. Lung-RADS Category 4B, suspicious. Additional imaging evaluation or consultation with pulmonary medicine or thoracic surgery recommended. 2. Age advanced coronary artery atherosclerosis. Recommend assessment of coronary risk factors and consideration of medical therapy. 3. Extensive old granulomatous disease. These results will be called to the ordering clinician or representative by the Radiologist Assistant, and communication documented in the PACS or zVision Dashboard.

## 2015-03-30 NOTE — Telephone Encounter (Signed)
-----   Message from Lieutenant Diego, RN sent at 03/30/2015  4:07 PM EDT ----- Regarding: CT screening finding Dr. Nadine Counts, Please see the Ct results for Ms. Medaglia. If you are agreeable I would like to obtain an appointment with Nestor Lewandowsky, thoracic surgery this Friday. Thanks, [employer]

## 2015-03-31 ENCOUNTER — Telehealth: Payer: Self-pay | Admitting: *Deleted

## 2015-03-31 NOTE — Telephone Encounter (Signed)
Robin Jones please have [name] cancel her [name] Pulmonology appointment for now, I got the following note from Shawn:  [name] don't have to do anything to get the appt. With Dr. Genevive Bi. I see the message from Dr. Nadine Counts and will arrange for consultation as well as notifying the patient. Please let me know if I can help in any other way. Thanks.

## 2015-03-31 NOTE — Telephone Encounter (Signed)
Notified of appointment with Dr. Genevive Bi at the cancer center this Friday. Appointment requested by Dr. Nadine Counts. Patient verbalizes understanding.

## 2015-03-31 NOTE — Telephone Encounter (Signed)
Robin Jones please have [name] cancel her [facility] Pulmonology appointment for now, I got the following note from Shawn:  [name] don't have to do anything to get the appt. With Dr. Genevive Bi. I see the message from Dr. Nadine Counts and will arrange for consultation as well as notifying the patient. Please let me know if I can help in any other way. Thanks.

## 2015-03-31 NOTE — Telephone Encounter (Signed)
Closing encounter, Dr. Nadine Counts has verbally explained information below to [facility] Surveyor, minerals).

## 2015-04-01 ENCOUNTER — Encounter: Payer: Medicare Other | Admitting: Physical Therapy

## 2015-04-02 ENCOUNTER — Inpatient Hospital Stay (HOSPITAL_BASED_OUTPATIENT_CLINIC_OR_DEPARTMENT_OTHER): Payer: Medicare Other | Admitting: Cardiothoracic Surgery

## 2015-04-02 ENCOUNTER — Encounter: Payer: Self-pay | Admitting: *Deleted

## 2015-04-02 ENCOUNTER — Encounter: Payer: Self-pay | Admitting: Cardiothoracic Surgery

## 2015-04-02 VITALS — BP 129/70 | HR 79 | Temp 98.3°F | Resp 18 | Ht 65.0 in | Wt 114.0 lb

## 2015-04-02 DIAGNOSIS — R918 Other nonspecific abnormal finding of lung field: Secondary | ICD-10-CM

## 2015-04-02 NOTE — Progress Notes (Signed)
Patient ID: Robin Jones, female   DOB: 10-31-1950, 64 y.o.   MRN: [id number]  Chief Complaint  Patient presents with  . New Evaluation    lung nodule seen on ct screening    HPI Robin Jones is a 64 y.o. female.  This patient is referred to me by Dr. Iva Lento for a right upper lobe nodule with other pulmonary nodules as well. She underwent a CT screening procedure and a right upper lobe 2 cm nodule is identified.  This patient is a 64 year old woman with a long-standing smoking history who continues to smoke at least one pack cigarettes per day who underwent a CT screening procedure. The patient states that she has not been short of breath. She is able to walk up and down a flight of stairs. She also has had a 10 pound weight loss. This has been over the last several months. Because of these symptoms she had a CT screening study done. That revealed bilateral pulmonary nodules as well as a left more dominant right upper lobe nodule which was consistent with either tumor or scarring. The patient is sent here for consideration of ongoing diagnostic and therapeutic procedures. She does use Advair and Flonase which help. She does not complain of any cough or hemoptysis. She has had a 10 pound weight loss which has been unexplained.   Past Medical History  Diagnosis Date  . Hypertension   . Depression   . GERD (gastroesophageal reflux disease)   . Chronic kidney disease   . Constipation   . Hyperlipidemia   . Personal history of tobacco use, presenting hazards to health 03/23/2015  . Hearing loss of both ears   . Skin cancer of face     Past Surgical History  Procedure Laterality Date  . Abdominal hysterectomy  1980    partial  . Back surgery  1978  . Tonsillectomy  11  . Colonoscopy N/A 12/22/2014    Procedure: COLONOSCOPY;  Surgeon: Lucilla Lame, MD;  Location: [facility] ENDOSCOPY;  Service: Endoscopy;  Laterality: N/A;  . Inner ear surgery Left 2013    Family History  Problem Relation  Age of Onset  . Heart disease Mother   . Thyroid disease Father   . Hepatitis C Sister   . Thyroid disease Sister   . Cancer Brother 55    in muscle of leg  . Heart disease Brother   . Liver cancer Sister 47  . COPD Sister   . Diabetes Maternal Grandmother     Social History Social History  Substance Use Topics  . Smoking status: Current Every Day Smoker -- 1.00 packs/day for 45 years    Types: Cigarettes  . Smokeless tobacco: Never Used  . Alcohol Use: No    No Known Allergies  Current Outpatient Prescriptions  Medication Sig Dispense Refill  . acetaminophen (TYLENOL) 325 MG tablet Take 650 mg by mouth every 6 (six) hours as needed for moderate pain or headache.    . ADVAIR DISKUS 250-50 MCG/DOSE AEPB Inhale 1 puff into the lungs daily as needed. For shortness of breath  0  . fexofenadine (ALLEGRA) 180 MG tablet Take 180 mg by mouth daily.    . fluticasone (FLONASE) 50 MCG/ACT nasal spray Place 2 sprays into both nostrils daily.    Marland Kitchen LORazepam (ATIVAN) 0.5 MG tablet Take 0.5 mg by mouth daily.    Marland Kitchen lovastatin (MEVACOR) 40 MG tablet Take 40 mg by mouth at bedtime.    Marland Kitchen omeprazole (PRILOSEC)  40 MG capsule Take 40 mg by mouth daily.    Marland Kitchen PROAIR HFA 108 (90 BASE) MCG/ACT inhaler Inhale 2 puffs into the lungs every 6 (six) hours as needed for wheezing or shortness of breath.   0  . sertraline (ZOLOFT) 100 MG tablet   0   No current facility-administered medications for this visit.    Location, Quality, Duration, Severity, Timing, Context, Modifying Factors, Associated Signs and Symptoms.  Review of Systems A complete review of systems was asked and was negative except for the following positive findings weight loss, loss of appetite, difficulty with her vision, difficulty with hearing, palpitations, wheezing, trouble swallowing, joint pain, depression, anxiety, easy bruising.  Blood pressure 129/70, pulse 79, temperature 98.3 F (36.8 C), temperature source Oral, resp. rate  18, height 5\' 5"  (1.651 m), weight 114 lb (51.71 kg), SpO2 99 %.  Physical Exam CONSTITUTIONAL:  Pleasant, well-developed, well-nourished, and in no acute distress. EYES: Pupils equal and reactive to light, Sclera non-icteric EARS, NOSE, MOUTH AND THROAT:  The oropharynx was clear.  Dentition is good repair.  Oral mucosa pink and moist. LYMPH NODES:  Lymph nodes in the neck and axillae were normal RESPIRATORY:  Lungs were clear.  Normal respiratory effort without pathologic use of accessory muscles of respiration CARDIOVASCULAR: Heart was regular without murmurs.  There were no carotid bruits. GI: The abdomen was soft, nontender, and nondistended. There were no palpable masses. There was no hepatosplenomegaly. There were normal bowel sounds in all quadrants. GU:   MUSCULOSKELETAL:  Normal muscle strength and tone.  No cyanosis.  Mild clubbing SKIN:  There were no pathologic skin lesions.  There were no nodules on palpation. NEUROLOGIC:  Sensation is normal.  Cranial nerves are grossly intact. PSYCH:  Oriented to person, place and time.  Mood and affect are normal.  Data Reviewed I have independently reviewed the patient's CT scan  I have personally reviewed the patient's imaging and medical records.    Assessment    I have independently reviewed the patient's CT scanning confirm the presence of a irregularly shaped right upper lobe mass. This is approximately 2 cm in size. Although it is in the location of some calcified scar this may represent a malignancy as well. There are other scattered calcified pulmonary joules as well as calcified lymph nodes within the mediastinum and hilum. I do not appreciate any obvious metastatic disease.    Plan    I would like to obtain a PET scan and complete pulmonary function tests. I spent a long time today counseling the patient regarding smoking cessation and its many benefits. She will also meet with our nurse coordinator to discuss this as well. I  will see her back again in one week after the CT PET and the pulmonary function studies are complete.       Nestor Lewandowsky, MD 04/02/2015, 11:42 AM

## 2015-04-02 NOTE — Progress Notes (Signed)
Patient states that our "office should only share information to my sisters: Micheline Maze or Antony Madura. If my son calls, I will talk to him personally. Do not share any information with anyone else."

## 2015-04-02 NOTE — Progress Notes (Signed)
  Oncology Nurse Navigator Documentation    Navigator Encounter Type: Clinic/MDC (04/02/15 1200) Patient Visit Type: Surgery;Initial (04/02/15 1200) Treatment Phase: Abnormal Scans (04/02/15 1200)     Interventions: Coordination of Care (04/02/15 1200)            Time Spent with Patient: 45 (04/02/15 1200)   Met with patient at initial thoracic surgery appointment. Introduced Programmer, multimedia and reviewed plan of care. Will follow and assist with coordination of care to include PET and Pft's prior to followup with Dr. Genevive Bi next week.

## 2015-04-05 ENCOUNTER — Other Ambulatory Visit: Payer: Self-pay | Admitting: *Deleted

## 2015-04-05 DIAGNOSIS — R918 Other nonspecific abnormal finding of lung field: Secondary | ICD-10-CM

## 2015-04-06 ENCOUNTER — Encounter: Payer: Medicare Other | Admitting: Physical Therapy

## 2015-04-06 ENCOUNTER — Other Ambulatory Visit: Payer: Self-pay | Admitting: Family Medicine

## 2015-04-06 ENCOUNTER — Ambulatory Visit: Payer: Medicare Other | Attending: Cardiothoracic Surgery

## 2015-04-06 DIAGNOSIS — R05 Cough: Secondary | ICD-10-CM | POA: Insufficient documentation

## 2015-04-06 DIAGNOSIS — R059 Cough, unspecified: Secondary | ICD-10-CM

## 2015-04-06 DIAGNOSIS — R918 Other nonspecific abnormal finding of lung field: Secondary | ICD-10-CM

## 2015-04-06 DIAGNOSIS — J441 Chronic obstructive pulmonary disease with (acute) exacerbation: Secondary | ICD-10-CM | POA: Insufficient documentation

## 2015-04-06 MED ORDER — ALBUTEROL SULFATE (2.5 MG/3ML) 0.083% IN NEBU
2.5000 mg | INHALATION_SOLUTION | Freq: Once | RESPIRATORY_TRACT | Status: AC
  Administered 2015-04-06: 2.5 mg via RESPIRATORY_TRACT
  Filled 2015-04-06: qty 3

## 2015-04-07 ENCOUNTER — Ambulatory Visit: Payer: Medicare Other

## 2015-04-08 ENCOUNTER — Inpatient Hospital Stay: Payer: Medicare Other | Admitting: Cardiothoracic Surgery

## 2015-04-08 ENCOUNTER — Encounter: Payer: Medicare Other | Admitting: Physical Therapy

## 2015-04-09 ENCOUNTER — Inpatient Hospital Stay: Payer: Medicare Other | Attending: Cardiothoracic Surgery | Admitting: Cardiothoracic Surgery

## 2015-04-09 ENCOUNTER — Ambulatory Visit
Admission: RE | Admit: 2015-04-09 | Discharge: 2015-04-09 | Disposition: A | Discharge status: Home / Self Care | Payer: Medicare Other | Source: Ambulatory Visit | Attending: Oncology | Admitting: Oncology

## 2015-04-09 VITALS — BP 127/68 | HR 60 | Temp 95.5°F | Resp 18 | Ht 64.0 in | Wt 115.0 lb

## 2015-04-09 DIAGNOSIS — K5900 Constipation, unspecified: Secondary | ICD-10-CM | POA: Insufficient documentation

## 2015-04-09 DIAGNOSIS — K219 Gastro-esophageal reflux disease without esophagitis: Secondary | ICD-10-CM | POA: Insufficient documentation

## 2015-04-09 DIAGNOSIS — Z79899 Other long term (current) drug therapy: Secondary | ICD-10-CM | POA: Insufficient documentation

## 2015-04-09 DIAGNOSIS — E785 Hyperlipidemia, unspecified: Secondary | ICD-10-CM | POA: Insufficient documentation

## 2015-04-09 DIAGNOSIS — F17210 Nicotine dependence, cigarettes, uncomplicated: Secondary | ICD-10-CM | POA: Insufficient documentation

## 2015-04-09 DIAGNOSIS — R590 Localized enlarged lymph nodes: Secondary | ICD-10-CM | POA: Insufficient documentation

## 2015-04-09 DIAGNOSIS — R942 Abnormal results of pulmonary function studies: Secondary | ICD-10-CM | POA: Diagnosis not present

## 2015-04-09 DIAGNOSIS — N189 Chronic kidney disease, unspecified: Secondary | ICD-10-CM | POA: Insufficient documentation

## 2015-04-09 DIAGNOSIS — E041 Nontoxic single thyroid nodule: Secondary | ICD-10-CM | POA: Insufficient documentation

## 2015-04-09 DIAGNOSIS — J449 Chronic obstructive pulmonary disease, unspecified: Secondary | ICD-10-CM | POA: Diagnosis not present

## 2015-04-09 DIAGNOSIS — Z9181 History of falling: Secondary | ICD-10-CM | POA: Insufficient documentation

## 2015-04-09 DIAGNOSIS — R911 Solitary pulmonary nodule: Secondary | ICD-10-CM | POA: Insufficient documentation

## 2015-04-09 DIAGNOSIS — R918 Other nonspecific abnormal finding of lung field: Secondary | ICD-10-CM

## 2015-04-09 DIAGNOSIS — N183 Chronic kidney disease, stage 3 (moderate): Secondary | ICD-10-CM | POA: Diagnosis not present

## 2015-04-09 LAB — GLUCOSE, CAPILLARY: Glucose-Capillary: 81 mg/dL (ref 65–99)

## 2015-04-09 MED ORDER — FLUDEOXYGLUCOSE F - 18 (FDG) INJECTION
12.8900 | Freq: Once | INTRAVENOUS | Status: DC | PRN
  Administered 2015-04-09: 12.89 via INTRAVENOUS
  Filled 2015-04-09: qty 12.89

## 2015-04-09 NOTE — Progress Notes (Signed)
[facility] Inpatient Post-Op Note  Patient ID: Robin Jones, female   DOB: [birth year], 64 y.o.   MRN: [id number]  HISTORY: This 64 year old female presents today after undergoing a PET scan and pulmonary function studies. Her pulmonary function studies are excellent. Her PET scan today reveals uptake in the left lobe of her thyroid as well as in the right upper lobe mass and a 9 mm pretracheal lymph node. Other borderline uptake present in the mediastinal lymph nodes is related to prior granulomatous disease. However I am concerned about the pretracheal lymph node and the thyroid as well. The patient has a history of a thyroid nodule has been followed by Dr. Richardson Landry in ENT. With this additional information we will get him to see the patient as well. She states that she has had no further cough. She's not short of breath. She is down to 4 cigarettes per day. She does not complain of any pain.   Filed Vitals:   04/09/15 0859  BP: 127/68  Pulse: 60  Temp: 95.5 F (35.3 C)  Resp: 18     EXAM: Resp: Lungs are clear bilaterally.  No respiratory distress, normal effort. Heart:  Regular without murmurs Abd:  Abdomen is soft, non distended and non tender. No masses are palpable.  There is no rebound and no guarding.  Neurological: Alert and oriented to person, place, and time. Coordination normal.  Skin: Skin is warm and dry. No rash noted. No diaphoretic. No erythema. No pallor.  Psychiatric: Normal mood and affect. Normal behavior. Judgment and thought content normal.   No palpable thyroid nodule ASSESSMENT: I believe this patient has a right upper lobe malignancy with possible and to mediastinal lymphadenopathy. In addition I'm concerned about her thyroid.   PLAN:   I explained to the patient the options at this point. I believe that a endobronchial ultrasound and navigational bronchoscopy for biopsy of the right upper lobe mass and staging the mediastinum would be helpful. I will ask our  pulmonary medicine colleagues to evaluate. I will also set patient up to see Dr. Richardson Landry. She will come back to see me once that's complete.    Nestor Lewandowsky, MD

## 2015-04-13 ENCOUNTER — Encounter: Payer: Medicare Other | Admitting: Physical Therapy

## 2015-04-13 ENCOUNTER — Telehealth: Payer: Self-pay | Admitting: *Deleted

## 2015-04-13 NOTE — Telephone Encounter (Signed)
  Oncology Nurse Navigator Documentation    Navigator Encounter Type: Telephone (04/13/15 1000)   Treatment Phase: Abnormal Scans (04/13/15 1000)     Interventions: Coordination of Care (04/13/15 1000)            Time Spent with Patient: 15 (04/13/15 1000)   Notified patient of pre-op appointment scheduled for 9/7 12 noon in [facility], 2nd floor, to bring medications. Also notified of procedure scheduled for 9/8 11am arrival [place] arrival. Reminded to be NPO after midnight, continue to avoid anticoagulant medications, and have a driver and support person to be with patient at home after the procedure. Patient verbalizes understanding and reads back appointments.

## 2015-04-14 ENCOUNTER — Encounter
Admission: RE | Admit: 2015-04-14 | Discharge: 2015-04-14 | Disposition: A | Discharge status: Home / Self Care | Payer: Medicare Other | Source: Ambulatory Visit | Attending: Internal Medicine | Admitting: Internal Medicine

## 2015-04-14 ENCOUNTER — Ambulatory Visit: Payer: Medicare Other

## 2015-04-14 DIAGNOSIS — R918 Other nonspecific abnormal finding of lung field: Secondary | ICD-10-CM | POA: Diagnosis present

## 2015-04-14 DIAGNOSIS — F17210 Nicotine dependence, cigarettes, uncomplicated: Secondary | ICD-10-CM | POA: Diagnosis not present

## 2015-04-14 DIAGNOSIS — E041 Nontoxic single thyroid nodule: Secondary | ICD-10-CM | POA: Diagnosis not present

## 2015-04-14 NOTE — Patient Instructions (Signed)
  Your procedure is scheduled on: 04/15/15 11 AM Report to Day Surgery. [facility]LO[facility] To find out your arrival time please call [phone] between 1PM - 3PM on   Remember: Instructions that are not followed completely may result in serious medical risk, up to and including death, or upon the discretion of your surgeon and anesthesiologist your surgery may need to be rescheduled.    __X__ 1. Do not eat food or drink liquids after midnight. No gum chewing or hard candies.     __X__ 2. No Alcohol for 24 hours before or after surgery.   ____ 3. Bring all medications with you on the day of surgery if instructed.    ___X_ 4. Notify your doctor if there is any change in your medical condition     (cold, fever, infections).     Do not wear jewelry, make-up, hairpins, clips or nail polish.  Do not wear lotions, powders, or perfumes. You may wear deodorant.  Do not shave 48 hours prior to surgery. Men may shave face and neck.  Do not bring valuables to the hospital.    [facility] is not responsible for any belongings or valuables.               Contacts, dentures or bridgework may not be worn into surgery.  Leave your suitcase in the car. After surgery it may be brought to your room.  For patients admitted to the hospital, discharge time is determined by your                treatment team.   Patients discharged the day of surgery will not be allowed to drive home.   Please read over the following fact sheets that you were given:   Surgical Site Infection Prevention   ____ Take these medicines the morning of surgery with A SIP OF WATER:    1. ATIVAN  2. OMEPRAZOLE  3.   4.  5.  6.  ____ Fleet Enema (as directed)   ____ Use CHG Soap as directed  _X___ Use inhalers on the day of surgery  ____ Stop metformin 2 days prior to surgery    ____ Take 1/2 of usual insulin dose the night before surgery and none on the morning of surgery.   ____ Stop Coumadin/Plavix/aspirin  on   ____ Stop Anti-inflammatories on    ____ Stop supplements until after surgery.    ____ Bring C-Pap to the hospital.

## 2015-04-15 ENCOUNTER — Ambulatory Visit
Admission: RE | Admit: 2015-04-15 | Discharge: 2015-04-15 | Disposition: A | Discharge status: Home / Self Care | Payer: Medicare Other | Source: Ambulatory Visit | Attending: Internal Medicine | Admitting: Internal Medicine

## 2015-04-15 ENCOUNTER — Ambulatory Visit: Payer: Medicare Other | Admitting: Anesthesiology

## 2015-04-15 ENCOUNTER — Ambulatory Visit: Payer: Medicare Other | Admitting: Cardiothoracic Surgery

## 2015-04-15 ENCOUNTER — Encounter
Admission: RE | Disposition: A | Discharge status: Home / Self Care | Payer: Self-pay | Source: Ambulatory Visit | Attending: Internal Medicine

## 2015-04-15 ENCOUNTER — Encounter: Payer: Medicare Other | Admitting: Physical Therapy

## 2015-04-15 ENCOUNTER — Encounter: Payer: Self-pay | Admitting: *Deleted

## 2015-04-15 ENCOUNTER — Ambulatory Visit: Payer: Medicare Other

## 2015-04-15 DIAGNOSIS — R918 Other nonspecific abnormal finding of lung field: Secondary | ICD-10-CM | POA: Diagnosis not present

## 2015-04-15 DIAGNOSIS — F17210 Nicotine dependence, cigarettes, uncomplicated: Secondary | ICD-10-CM | POA: Insufficient documentation

## 2015-04-15 DIAGNOSIS — E041 Nontoxic single thyroid nodule: Secondary | ICD-10-CM | POA: Insufficient documentation

## 2015-04-15 HISTORY — PX: ENDOBRONCHIAL ULTRASOUND: SHX5096

## 2015-04-15 HISTORY — PX: ELECTROMAGNETIC NAVIGATION BROCHOSCOPY: SHX5369

## 2015-04-15 LAB — GLUCOSE, CAPILLARY: Glucose-Capillary: 91 mg/dL (ref 65–99)

## 2015-04-15 SURGERY — ELECTROMAGNETIC NAVIGATION BRONCHOSCOPY
Anesthesia: General

## 2015-04-15 MED ORDER — PHENYLEPHRINE HCL 0.25 % NA SOLN
1.0000 | Freq: Four times a day (QID) | NASAL | Status: DC | PRN
  Filled 2015-04-15: qty 15

## 2015-04-15 MED ORDER — ONDANSETRON HCL 4 MG/2ML IJ SOLN: 4.0000 mg | Freq: Once | INTRAMUSCULAR | Status: DC | PRN

## 2015-04-15 MED ORDER — FENTANYL CITRATE (PF) 100 MCG/2ML IJ SOLN
INTRAMUSCULAR | Status: DC | PRN
Start: 2015-04-15 — End: 2015-04-15
  Administered 2015-04-15: 100 ug via INTRAVENOUS

## 2015-04-15 MED ORDER — EPHEDRINE SULFATE 50 MG/ML IJ SOLN
INTRAMUSCULAR | Status: DC | PRN
  Administered 2015-04-15: 10 mg via INTRAVENOUS

## 2015-04-15 MED ORDER — LIDOCAINE HCL (CARDIAC) 20 MG/ML IV SOLN
INTRAVENOUS | Status: DC | PRN
  Administered 2015-04-15: 60 mg via INTRAVENOUS

## 2015-04-15 MED ORDER — GLYCOPYRROLATE 0.2 MG/ML IJ SOLN
INTRAMUSCULAR | Status: DC | PRN
  Administered 2015-04-15: 0.2 mg via INTRAVENOUS

## 2015-04-15 MED ORDER — ONDANSETRON HCL 4 MG/2ML IJ SOLN
INTRAMUSCULAR | Status: DC | PRN
  Administered 2015-04-15: 4 mg via INTRAVENOUS

## 2015-04-15 MED ORDER — PROPOFOL 10 MG/ML IV BOLUS
INTRAVENOUS | Status: DC | PRN
  Administered 2015-04-15: 150 mg via INTRAVENOUS
  Administered 2015-04-15: 50 mg via INTRAVENOUS

## 2015-04-15 MED ORDER — NEOSTIGMINE METHYLSULFATE 10 MG/10ML IV SOLN
INTRAVENOUS | Status: DC | PRN
  Administered 2015-04-15: 1 mg via INTRAVENOUS

## 2015-04-15 MED ORDER — SUCCINYLCHOLINE CHLORIDE 20 MG/ML IJ SOLN
INTRAMUSCULAR | Status: DC | PRN
  Administered 2015-04-15: 100 mg via INTRAVENOUS

## 2015-04-15 MED ORDER — LIDOCAINE HCL 2 % EX GEL: 1.0000 "application " | Freq: Once | CUTANEOUS | Status: DC

## 2015-04-15 MED ORDER — MIDAZOLAM HCL 5 MG/5ML IJ SOLN
INTRAMUSCULAR | Status: DC | PRN
  Administered 2015-04-15: 2 mg via INTRAVENOUS

## 2015-04-15 MED ORDER — LACTATED RINGERS IV SOLN
INTRAVENOUS | Status: DC
  Administered 2015-04-15: 11:00:00 via INTRAVENOUS

## 2015-04-15 MED ORDER — FENTANYL CITRATE (PF) 100 MCG/2ML IJ SOLN: 25.0000 ug | INTRAMUSCULAR | Status: DC | PRN

## 2015-04-15 MED ORDER — ROCURONIUM BROMIDE 100 MG/10ML IV SOLN
INTRAVENOUS | Status: DC | PRN
  Administered 2015-04-15: 5 mg via INTRAVENOUS

## 2015-04-15 MED ORDER — DEXAMETHASONE SODIUM PHOSPHATE 10 MG/ML IJ SOLN
INTRAMUSCULAR | Status: DC | PRN
  Administered 2015-04-15: 10 mg via INTRAVENOUS

## 2015-04-15 MED ORDER — PHENYLEPHRINE HCL 10 MG/ML IJ SOLN
INTRAMUSCULAR | Status: DC | PRN
  Administered 2015-04-15 (×2): 50 ug via INTRAVENOUS
  Administered 2015-04-15 (×3): 100 ug via INTRAVENOUS

## 2015-04-15 NOTE — Transfer of Care (Signed)
Immediate Anesthesia Transfer of Care Note  Patient: Robin Jones  Procedure(s) Performed: Procedure(s): ELECTROMAGNETIC NAVIGATION BRONCHOSCOPY (N/A) ENDOBRONCHIAL ULTRASOUND (N/A)  Patient Location: PACU  Anesthesia Type:General  Level of Consciousness: awake and patient cooperative  Airway & Oxygen Therapy: Patient Spontanous Breathing and Patient connected to face mask oxygen  Post-op Assessment: Report given to RN  Post vital signs: Reviewed and stable  Last Vitals:  Filed Vitals:   04/15/15 1338  BP: 129/63  Pulse: 81  Temp: 36.8 C  Resp: 21    Complications: No apparent anesthesia complications

## 2015-04-15 NOTE — Discharge Instructions (Signed)
°

## 2015-04-15 NOTE — Interval H&P Note (Signed)
History and Physical Interval Note:  04/15/2015 11:19 AM  Robin Jones  has presented today for surgery, with the diagnosis of pet positive lung mass with pet positive nodes  The various methods of treatment have been discussed with the patient and family. After consideration of risks, benefits and other options for treatment, the patient has consented to  Procedure(s): ELECTROMAGNETIC NAVIGATION BRONCHOSCOPY (N/A) ENDOBRONCHIAL ULTRASOUND (N/A) as a surgical intervention .  The patient's history has been reviewed, patient examined, no change in status, stable for surgery.  I have reviewed the patient's chart and labs.  Questions were answered to the patient's satisfaction.     Flora Lipps

## 2015-04-15 NOTE — H&P (View-Only) (Signed)
Robin Jones Inpatient Post-Op Note  Patient ID: Robin Jones, female   DOB: May 07, 1951, 64 y.o.   MRN: [id number]  HISTORY: This 64 year old female presents today after undergoing a PET scan and pulmonary function studies. Her pulmonary function studies are excellent. Her PET scan today reveals uptake in the left lobe of her thyroid as well as in the right upper lobe mass and a 9 mm pretracheal lymph node. Other borderline uptake present in the mediastinal lymph nodes is related to prior granulomatous disease. However I am concerned about the pretracheal lymph node and the thyroid as well. The patient has a history of a thyroid nodule has been followed by Dr. Richardson Landry in ENT. With this additional information we will get him to see the patient as well. She states that she has had no further cough. She's not short of breath. She is down to 4 cigarettes per day. She does not complain of any pain.   Filed Vitals:   04/09/15 0859  BP: 127/68  Pulse: 60  Temp: 95.5 F (35.3 C)  Resp: 18     EXAM: Resp: Lungs are clear bilaterally.  No respiratory distress, normal effort. Heart:  Regular without murmurs Abd:  Abdomen is soft, non distended and non tender. No masses are palpable.  There is no rebound and no guarding.  Neurological: Alert and oriented to person, place, and time. Coordination normal.  Skin: Skin is warm and dry. No rash noted. No diaphoretic. No erythema. No pallor.  Psychiatric: Normal mood and affect. Normal behavior. Judgment and thought content normal.   No palpable thyroid nodule ASSESSMENT: I believe this patient has a right upper lobe malignancy with possible and to mediastinal lymphadenopathy. In addition I'm concerned about her thyroid.   PLAN:   I explained to the patient the options at this point. I believe that a endobronchial ultrasound and navigational bronchoscopy for biopsy of the right upper lobe mass and staging the mediastinum would be helpful. I will ask our  pulmonary medicine colleagues to evaluate. I will also set patient up to see Dr. Richardson Landry. She will come back to see me once that's complete.    Nestor Lewandowsky, MD

## 2015-04-15 NOTE — Op Note (Signed)
Electromagnetic Navigation Bronchoscopy: Indication: RUL lung mass  Preoperative Diagnosis:mass Post Procedure Diagnosis: lung mass Consent: written Risks and benefits explained in detail including risk of infection, bleeding, respiratory failure and death.   Hand washing performed prior to starting the procedure.   Type of Anesthesia: see Anesthesiology records .   Procedure Performed:  Virtual Bronchoscopy with Multi-planar Image analysis, 3-D reconstruction of coronal, sagittal and multi-planar images for the purposes of planning real-time bronchoscopy using the [employer] Electromagnetic Navigation Bronchoscopy System (superDimension)..   Description of Procedure: After obtaining informed consent from the patient, the above sedative and anesthetic measures were carried out, flexible fiberoptic bronchoscope was inserted via an oral bite block. Posterior pharynx was clear. The 2 vocal cords were easily traversed after application of local anesthetic.  The virtual camera was then placed into the central portion of the trachea. The trachea itself was inspected.  The main carina, right and left midstem bronchus and all the segmental and subsegmental airways by virtual bronchoscopy were brieftly inspected.  The camera was directed to standard registration points at the following centers: main carina, right upper lobe bronchus, right lower lobe bronchus, right middle lobe bronchus, left upper lobe bronchus, and the left lower lobe bronchus. This data was transferred to the i-[employer] ENB system for real-time bronchoscopy. The scope was navigated to RUL mass.    Specimans Obtained:FROM RUL MASS  TRANSBRONCHIAL Fine Needle Aspirations 21G times:7  ENDOBRONCHIAL Forceps Biopsy times:10  TRANSBRONCHIAL Triple Needle Brush:2    Fluoroscopy:  Fluoroscopy was utilized during the course of this procedure to assure that biopsies were taken in a safe manner under fluoroscopic guidance with no spot films  required.   Complications:none   Estimated Blood Loss: none  Monitoring:  The patient was monitored with continuous oximetry and received supplemental nasal cannula oxygen throughout the procedure. In addition, serial blood pressure measurements and continuous electrocardiography showed these physiologic parameters to remain tolerable throughout the procedure.   Assessment and Plan/Additional Comments:follow up pathology results    Corrin Parker, M.D.  [facility]  Medical Director [facility] Cardio-Pulmonary Department

## 2015-04-15 NOTE — Anesthesia Preprocedure Evaluation (Addendum)
Anesthesia Evaluation  Patient identified by MRN, date of birth, ID band Patient awake    Reviewed: Allergy & Precautions, NPO status , Patient's Chart, lab work & pertinent test results  Airway Mallampati: III  TM Distance: >3 FB     Dental  (+) Upper Dentures, Lower Dentures   Pulmonary COPD,  COPD inhaler, Current Smoker,    Pulmonary exam normal breath sounds clear to auscultation       Cardiovascular negative cardio ROS Normal cardiovascular exam     Neuro/Psych Depression    GI/Hepatic GERD  Medicated and Controlled,  Endo/Other  negative endocrine ROSThyroid mass  Renal/GU Renal InsufficiencyRenal disease  negative genitourinary   Musculoskeletal  (+) Arthritis , Osteoarthritis,    Abdominal Normal abdominal exam  (+)   Peds negative pediatric ROS (+)  Hematology negative hematology ROS (+)   Anesthesia Other Findings   Reproductive/Obstetrics                            Anesthesia Physical Anesthesia Plan  ASA: III  Anesthesia Plan: General   Post-op Pain Management:    Induction: Intravenous  Airway Management Planned: Oral ETT  Additional Equipment:   Intra-op Plan:   Post-operative Plan: Extubation in OR  Informed Consent: I have reviewed the patients History and Physical, chart, labs and discussed the procedure including the risks, benefits and alternatives for the proposed anesthesia with the patient or authorized representative who has indicated his/her understanding and acceptance.   Dental advisory given  Plan Discussed with: CRNA and Surgeon  Anesthesia Plan Comments:         Anesthesia Quick Evaluation

## 2015-04-15 NOTE — Anesthesia Postprocedure Evaluation (Signed)
  Anesthesia Post-op Note  Patient: Robin Jones  Procedure(s) Performed: Procedure(s): ELECTROMAGNETIC NAVIGATION BRONCHOSCOPY (N/A) ENDOBRONCHIAL ULTRASOUND (N/A)  Anesthesia type:General  Patient location: PACU  Post pain: Pain level controlled  Post assessment: Post-op Vital signs reviewed, Patient's Cardiovascular Status Stable, Respiratory Function Stable, Patent Airway and No signs of Nausea or vomiting  Post vital signs: Reviewed and stable  Last Vitals:  Filed Vitals:   04/15/15 1511  BP: 130/72  Pulse: 77  Temp: 36.1 C  Resp: 18    Level of consciousness: awake, alert  and patient cooperative  Complications: No apparent anesthesia complications

## 2015-04-15 NOTE — Anesthesia Procedure Notes (Signed)
Procedure Name: Intubation Date/Time: 04/15/2015 12:16 PM Performed by: Dionne Bucy Pre-anesthesia Checklist: Patient identified, Patient being monitored, Timeout performed, Emergency Drugs available and Suction available Patient Re-evaluated:Patient Re-evaluated prior to inductionOxygen Delivery Method: Circle system utilized Preoxygenation: Pre-oxygenation with 100% oxygen Intubation Type: IV induction Ventilation: Mask ventilation without difficulty Laryngoscope Size: Miller and 2 Grade View: Grade I Tube type: Oral Tube size: 8.5 mm Number of attempts: 1 Airway Equipment and Method: Stylet Placement Confirmation: ETT inserted through vocal cords under direct vision,  positive ETCO2 and breath sounds checked- equal and bilateral Secured at: 21 cm Tube secured with: Tape Dental Injury: Teeth and Oropharynx as per pre-operative assessment

## 2015-04-16 ENCOUNTER — Encounter: Payer: Self-pay | Admitting: Internal Medicine

## 2015-04-16 LAB — SURGICAL PATHOLOGY

## 2015-04-19 LAB — CYTOLOGY - NON PAP

## 2015-04-20 ENCOUNTER — Encounter: Payer: Medicare Other | Admitting: Physical Therapy

## 2015-04-20 ENCOUNTER — Other Ambulatory Visit: Payer: Self-pay | Admitting: Otolaryngology

## 2015-04-20 DIAGNOSIS — E041 Nontoxic single thyroid nodule: Secondary | ICD-10-CM

## 2015-04-22 ENCOUNTER — Encounter: Payer: Medicare Other | Admitting: Physical Therapy

## 2015-04-23 ENCOUNTER — Telehealth: Payer: Self-pay

## 2015-04-23 ENCOUNTER — Other Ambulatory Visit: Payer: Self-pay | Admitting: Family Medicine

## 2015-04-23 NOTE — Progress Notes (Signed)
  Oncology Nurse Navigator Documentation    Navigator Encounter Type: Other (04/23/15 1400)                      Time Spent with Patient: 15 (04/23/15 1400)   Pt called and wanted to get results of recent PET scan. Dr. Oliva Bustard and Genevive Bi are out of the office. Pt made an appt to see Dr Oliva Bustard for results Monday 9-19 per Magda Paganini NP

## 2015-04-26 ENCOUNTER — Encounter: Payer: Self-pay | Admitting: Oncology

## 2015-04-26 ENCOUNTER — Ambulatory Visit: Payer: Medicare Other

## 2015-04-26 ENCOUNTER — Inpatient Hospital Stay (HOSPITAL_BASED_OUTPATIENT_CLINIC_OR_DEPARTMENT_OTHER): Payer: Medicare Other | Admitting: Oncology

## 2015-04-26 VITALS — BP 139/77 | HR 67 | Temp 95.9°F | Wt 116.1 lb

## 2015-04-26 DIAGNOSIS — R942 Abnormal results of pulmonary function studies: Secondary | ICD-10-CM | POA: Diagnosis not present

## 2015-04-26 DIAGNOSIS — E0789 Other specified disorders of thyroid: Secondary | ICD-10-CM

## 2015-04-26 DIAGNOSIS — R590 Localized enlarged lymph nodes: Secondary | ICD-10-CM | POA: Diagnosis not present

## 2015-04-26 DIAGNOSIS — F17210 Nicotine dependence, cigarettes, uncomplicated: Secondary | ICD-10-CM

## 2015-04-26 DIAGNOSIS — Z79899 Other long term (current) drug therapy: Secondary | ICD-10-CM

## 2015-04-26 DIAGNOSIS — R911 Solitary pulmonary nodule: Secondary | ICD-10-CM

## 2015-04-26 LAB — TSH: TSH: 1.157 u[IU]/mL (ref 0.350–4.500)

## 2015-04-26 NOTE — Progress Notes (Signed)
[place] @ [facility] Telephone:(336) [phone]  Fax:(336) [place] OB: 1950/10/28  MR#: [id number]  XTG#:[id number]  Patient Care Team: Bobetta Lime, MD as PCP - General  CHIEF COMPLAINT:  Chief Complaint  Patient presents with  . Follow-up  1.  Patient had abnormal screening CT scan followed by abnormal PET scan with right upper lobe lung nodule and the right hilar and right paratracheal lymph node which were FDG avid  Patient underwent navigational bronchoscopy and multiple biopsies and cytology report when negative for any malignant cells (September, 2016) 3.  Patient had thyroid nodule which is being followed by ENT surgeon very closely with 2 negative biopsies  VISIT DIAGNOSIS:     ICD-9-CM ICD-10-CM   1. Thyroid mass of unclear etiology 239.7 D49.7 T4     TSH     Thyroglobulin Level      No history exists.    Oncology Flowsheet 12/22/2014 12/22/2014 04/15/2015  dexamethasone (DECADRON) IJ - - -  ondansetron (ZOFRAN) IV 4 mg - -    INTERVAL HISTORY: 64 year old lady was a chronic smoker trying to quit smoking.  No cough no hemoptysis no chest pain no weight loss.  Had a screening CT scan which was abnormal followed by a PET scan which was abnormal with right upper lobe nodule and right hilar lymphadenopathy which where hypermetabolic on a PET scan Patient underwent navigational bronchoscopy without any diagnosis.  Patient is here for further follow-up.  Extremely apprehensive but not any acute distress Patient is being seen at the request of nurse Navigator REVIEW OF SYSTEMS:   GENERAL:  Feels good.  Active.  No fevers, sweats or weight loss. PERFORMANCE STATUS (ECOG): 01 HEENT:  No visual changes, runny nose, sore throat, mouth sores or tenderness. Lungs: No shortness of breath or cough.  No hemoptysis. Cardiac:  No chest pain, palpitations, orthopnea, or PND. GI:  No nausea, vomiting, diarrhea, constipation, melena or hematochezia. GU:   No urgency, frequency, dysuria, or hematuria. Musculoskeletal:  No back pain.  No joint pain.  No muscle tenderness. Extremities:  No pain or swelling. Skin:  No rashes or skin changes. Neuro:  No headache, numbness or weakness, balance or coordination issues. Endocrine:  No diabetes, thyroid issues, hot flashes or night sweats. Psych:  No mood changes, depression or anxiety. Pain:  No focal pain. Review of systems:  All other systems reviewed and found to be negative.  As per HPI. Otherwise, a complete review of systems is negatve.  PAST MEDICAL HISTORY: Past Medical History  Diagnosis Date  . Depression   . GERD (gastroesophageal reflux disease)   . Chronic kidney disease   . Constipation   . Hyperlipidemia   . Personal history of tobacco use, presenting hazards to health 03/23/2015  . Hearing loss of both ears   . Skin cancer of face     PAST SURGICAL HISTORY: Past Surgical History  Procedure Laterality Date  . Abdominal hysterectomy  1980    partial  . Back surgery  1978  . Tonsillectomy  13  . Colonoscopy N/A 12/22/2014    Procedure: COLONOSCOPY;  Surgeon: Lucilla Lame, MD;  Location: [facility] END[facility]COPY;  Service: Endoscopy;  Laterality: N/A;  . Inner ear surgery Left 2013  . Electromagnetic navigation brochoscopy N/A 04/15/2015    Procedure: ELECTROMAGNETIC NAVIGATION BRONCHOSCOPY;  Surgeon: Flora Lipps, MD;  Location: [facility] ORS;  Service: Cardiopulmonary;  Laterality: N/A;  . Endobronchial ultrasound N/A 04/15/2015    Procedure: ENDOBRONCHIAL ULTRASOUND;  Surgeon: Flora Lipps, MD;  Location: [facility] ORS;  Service: Cardiopulmonary;  Laterality: N/A;    FAMILY HISTORY Family History  Problem Relation Age of Onset  . Heart disease Mother   . Thyroid disease Father   . Hepatitis C Sister   . Thyroid disease Sister   . Cancer Brother 55    in muscle of leg  . Heart disease Brother   . Liver cancer Sister 52  . COPD Sister   . Diabetes Maternal Grandmother           ADVANCED DIRECTIVES:   Patient does not have any living will or healthcare power of attorney.  Information was given .  Available resources had been discussed.  We will follow-up on subsequent appointments regarding this issue HEALTH MAINTENANCE: Social History  Substance Use Topics  . Smoking status: Current Every Day Smoker -- 1.00 packs/day for 45 years    Types: Cigarettes  . Smokeless tobacco: Never Used  . Alcohol Use: No      No Known Allergies  Current Outpatient Prescriptions  Medication Sig Dispense Refill  . acetaminophen (TYLENOL) 325 MG tablet Take 650 mg by mouth every 6 (six) hours as needed for moderate pain or headache.    . ADVAIR DISKUS 250-50 MCG/DOSE AEPB Inhale 1 puff into the lungs 2 (two) times daily. For shortness of breath  0  . docusate sodium (COLACE) 100 MG capsule Take 100 mg by mouth 2 (two) times daily.    . fexofenadine (ALLEGRA) 180 MG tablet Take 180 mg by mouth daily.    . fluticasone (FLONASE) 50 MCG/ACT nasal spray Place 2 sprays into both nostrils daily.    Marland Kitchen LORazepam (ATIVAN) 0.5 MG tablet Take 0.5 mg by mouth 2 (two) times daily.     Marland Kitchen lovastatin (MEVACOR) 40 MG tablet Take 40 mg by mouth at bedtime.    Marland Kitchen omeprazole (PRILOSEC) 40 MG capsule Take 40 mg by mouth daily.    Marland Kitchen PROAIR HFA 108 (90 BASE) MCG/ACT inhaler Inhale 2 puffs into the lungs every 6 (six) hours as needed for wheezing or shortness of breath.   0  . sertraline (ZOLOFT) 100 MG tablet   0   No current facility-administered medications for this visit.    OBJECTIVE: PHYSICAL EXAM: GENERAL:  Well developed, well nourished, sitting comfortably in the exam room in no acute distress. MENTAL STATUS:  Alert and oriented to person, place and time. . ENT:  Oropharynx clear without lesion.  Tongue normal. Mucous membranes moist.  RESPIRATORY:  Clear to auscultation without rales, wheezes or rhonchi. CARDIOVASCULAR:  Regular rate and rhythm without murmur, rub or  gallop. . ABDOMEN:  Soft, non-tender, with active bowel sounds, and no hepatosplenomegaly.  No masses. BACK:  No CVA tenderness.  No tenderness on percussion of the back or rib cage. SKIN:  No rashes, ulcers or lesions. EXTREMITIES: No edema, no skin discoloration or tenderness.  No palpable cords. LYMPH NODES: No palpable cervical, supraclavicular, axillary or inguinal adenopathy  NEUROLOGICAL: Unremarkable. PSYCH:  Appropriate.  Filed Vitals:   04/26/15 1548  BP: 139/77  Pulse: 67  Temp: 95.9 F (35.5 C)     Body mass index is 19.92 kg/(m^2).    ECOG FS:1 - Symptomatic but completely ambulatory  LAB RESULTS:  No visits with results within 5 Day(s) from this visit. Latest known visit with results is:  Admission on 04/15/2015, Discharged on 04/15/2015  Component Date Value Ref Range Status  . SURGICAL PATHOLOGY 04/15/2015  Final                   Value:Surgical Pathology CASE: [id number] PATIENT: Jerelene Redden Surgical Pathology Report     SPECIMEN SUBMITTED: A. Lung, right upper lobe, forceps biopsy  CLINICAL HISTORY: None provided  PRE-OPERATIVE DIAGNOSIS: Right upper lobe mass  POST-OPERATIVE DIAGNOSIS: None provided     DIAGNOSIS: A. LUNG, RIGHT UPPER LOBE; ENDOBRONCHIAL FORCEPS BIOPSY: - NON-DIAGNOSTIC SAMPLE CONSISTING OF A 1 MM STROMAL FRAGMENT, AND SCANT BRONCHIAL EPITHELIAL CELLS AND MACROPHAGES.  Comment: No atypical or malignant cells are identified, but the sample does not appear to be representative of the lesion. Please also see concurrent cytology reports [id number] and [id number].   GROSS DESCRIPTION: A. Labeled: patient's name and medical record number Tissue Fragment(s): multiple minute Measurement: aggregate, 0.1 x 0.1 by less than 0.1 cm Comment: pink to red tissue/material 2 Diff Quik stained touch imprints also prepared  Entirely submitted in cassette(s): 1 for cel                         l block preparation at  [employer]    Final Diagnosis performed by Bryan Lemma, MD.  Electronically signed 04/16/2015 5:02:28PM    The electronic signature indicates that the named Attending Pathologist has evaluated the specimen  Technical component performed at [facility], [address], [place], [place] [address] Lab: [phone] Dir: Darrick Penna. Evette Doffing, MD  Professional component performed at [facility], [facility], [place], [place], [place] [address] Lab: [phone] Dir: Dellia Nims. Reuel Derby, MD    . CYTOLOGY - NON GYN 04/15/2015    Final                   Value:Cytology - Non PAP CASE: [id number] PATIENT: Jerelene Redden Non-Gyn Cytology Report     SPECIMEN SUBMITTED: A. Lung, right upper lobe FNA  CLINICAL HISTORY: None provided  PRE-OPERATIVE DIAGNOSIS: Right upper lobe mass  POST-OPERATIVE DIAGNOSIS: None Provided     DIAGNOSIS: A. LUNG, RIGHT UPPER LOBE; ELECTROMAGNETIC NAVIGATION BRONCHOSCOPY FNA: - NEGATIVE FOR MALIGNANCY. - BENIGN SQUAMOUS CELLS, ALVEOLAR MACROPHAGES, RARE BRONCHIAL CELLS, AND A FEW NEUTROPHILS ARE PRESENT.  Comment: The FNA sample does not contain findings that would account for the present of a mass, so additional evaluation will be necessary. Slides reviewed: 4 Diff-Quik slides, 4 pap stained slides, 1 ThinPrep, and slides from 1 sparsely cellular cell block   GROSS DESCRIPTION: A. Site: right upper lobe Procedure: ENB FNA Cytotechnologist: Rivka Barbara and Ashlee Howze Specimen(s) collected: 4 Diff Quik stained slides 4 Pap stained slides Specimen labeled needle:      Des                         cription: pink CytoLyt solution with a small amount of wispy fragments      Submitted for:           ThinPrep           Cell block(s): 1   A forceps biopsy was obtained and will be reported in a separate ARS report.  Final Diagnosis performed by Bryan Lemma, MD.  Electronically signed 04/19/2015 3:55:56PM    The  electronic signature indicates that the named Attending Pathologist has evaluated the specimen  Technical component performed at [facility], [address], [place], [place] [address] Lab: [phone] Dir: Darrick Penna. Evette Doffing, MD  Professional component performed at [facility], [facility], [address]  [place], [place], [place] [address] Lab: [phone] Dir: Dellia Nims. Reuel Derby, MD    . CYTOLOGY - NON GYN 04/15/2015    Final                   Value:Cytology - Non PAP CASE: [id number] PATIENT: Jerelene Redden Non-Gyn Cytology Report     SPECIMEN SUBMITTED: A. Right upper lobe, lung, brushing  CLINICAL HISTORY: None provided  PRE-OPERATIVE DIAGNOSIS: None Provided  POST-OPERATIVE DIAGNOSIS: None Provided     DIAGNOSIS: A. LUNG, RIGHT UPPER LOBE; ELECTROMAGNETIC NAVIGATION BRONCHOSCOPY BRUSHING: - NEGATIVE FOR MALIGNANCY. - ABUNDANT BENIGN BRONCHIAL EPITHELIAL CELLS ARE PRESENT, ADMIXED WITH MACROPHAGES.  Comment: There are no features in this specimen to account for the presence of a mass, so additional evaluation will be required. Please also refer to the concurrent FNA cytology report ([id number]), and forceps biopsy surgical pathology report ([id number]).  Slides reviewed: 2 Diff-Quik slides, 2 pap stained slides, 1 ThinPrep slide, and slides from 1 cell block.   GROSS DESCRIPTION: A. Site: right upper lobe Procedure: ENB brushing Cytotechnologist: Rivka Barbara and Ashlee Howze S                         pecimen(s) collected: 2 Diff Quik stained slides 2 Pap stained slides Specimen labeled brushing:      Description: pink CytoLyt solution with a metallic brush      Submitted for:           ThinPrep           Cell block(s): 1  A forceps biopsy was obtained and will be reported in a separate ARS report. Final Diagnosis performed by Bryan Lemma, MD.  Electronically signed 04/19/2015 4:08:10PM    The electronic signature indicates that the  named Attending Pathologist has evaluated the specimen  Technical component performed at [facility], [address], [place], [place] [address] Lab: [phone] Dir: Darrick Penna. Evette Doffing, MD  Professional component performed at [facility], [facility], [place], [place], [place] [address] Lab: [phone] Dir: Dellia Nims. Rubinas, MD    . Glucose-Capillary 04/15/2015 91  65 - 99 mg/dL Final     STUDIES: Nm Pet Image Initial (pi) Skull Base To Thigh  04/09/2015   CLINICAL DATA:  Initial Treatment strategy for lung mass of bilateral pulmonary nodules.  EXAM: NUCLEAR MEDICINE PET SKULL BASE TO THIGH  TECHNIQUE: 12.9 mCi F-18 FDG was injected intravenously. Full-ring PET imaging was performed from the skull base to thigh after the radiotracer. CT data was obtained and used for attenuation correction and anatomic localization.  FASTING BLOOD GLUCOSE:  Value: 81 mg/dl  COMPARISON:  CT 03/26/2015  FINDINGS: NECK  Hypermetabolic nodule within the LEFT lobe of thyroid gland measures 16 mm.  CHEST  Hypermetabolic RIGHT apical nodule measures 17 mm (image 61, series 4) with hypermetabolic activity (SUV max equal 5.3).  There is a hypermetabolic RIGHT hilar lymph node difficult define on this noncontrast exam with SUV max equal 4.3.  Hypermetabolic RIGHT paratracheal lymph node measures 11 mm on image 75, series 4 with SUV max 5.3.  There are bilateral calcified hilar lymph nodes and subcarinal nodes.  ABDOMEN/PELVIS  No abnormal hypermetabolic activity within the liver, pancreas, adrenal glands, or spleen. No hypermetabolic lymph nodes in the abdomen or pelvis. There are calcified granulomata within the spleen. Calcified plaque in the aorta.  SKELETON  No focal hypermetabolic activity to suggest skeletal metastasis.  IMPRESSION: 1. Hypermetabolic RIGHT apical nodule is  most concerning for primary bronchogenic carcinoma. 2. Hypermetabolic RIGHT paratracheal lymph node and RIGHT hilar consistent  with nodal metastasis. 3. Staging by FDG PET imaging T1a N2 M0 4. Evidence of prior granulomatous disease with calcified hilar lymph nodes and splenic granulomata. 5. Hypermetabolic nodule in the LEFT lobe of thyroid gland. Recommend thyroid ultrasound and potential biopsy to exclude incidental thyroid carcinoma.   Electronically Signed   By: Suzy Bouchard M.D.   On: 04/09/2015 09:11   Dg C-arm 1-60 Min-no Report  04/15/2015   CLINICAL DATA: procedure   C-ARM 1-60 MINUTES  Fluoroscopy was utilized by the requesting physician.  No radiographic  interpretation.     ASSESSMENT:  Abnormal PET scan with right apical lung nodule and hilar adenopathy which are PET positive ENB is negative Patient is chronic smoker and continues to smoke  PLAN:   I independently reviewed PET scan and CT scan. Informed patient about ENB  Results  We will discuss this case in tumor conference on Thursday meeting our option includes Continuing observation and repeat CT scan or PET scan in 3 months Possibility of needle biopsy if radiologist agreed to perform this biopsy All cardiothoracic surgeon for possibility of [place] procedure to review the lymph node and get biopsy done.  I told patient and family that we will be in touch with them my Thursday afternoon after of her case conference and discussion.  2.  Patient is chronic smoker trying to quit smoking we offered all the counseling and help including Chantix and nicotine patch.  According to patient and her last cigarette was last night at 9:00 and she is going to quit smoking.  Patient expressed understanding and was in agreement with this plan. She also understands that She can call clinic at any time with any questions, concerns, or complaints.    No matching staging information was found for the patient.  Forest Gleason, MD   04/26/2015 4:18 PM

## 2015-04-26 NOTE — Progress Notes (Signed)
Patient does not have living will.  Current every day smoker.  Patient seen by Dr. Genevive Bi for lung mass.

## 2015-04-27 ENCOUNTER — Telehealth: Payer: Self-pay | Admitting: *Deleted

## 2015-04-27 NOTE — Telephone Encounter (Signed)
  Oncology Nurse Navigator Documentation    Navigator Encounter Type: Telephone (04/27/15 1100)                          Called to follow up from medical oncology visit. Spoke at length with sister as patient was sleeping. Reviewed plan of care and will follow up after conference on Thursday the 22nd.

## 2015-04-29 ENCOUNTER — Telehealth: Payer: Self-pay | Admitting: *Deleted

## 2015-04-29 NOTE — Telephone Encounter (Signed)
  Oncology Nurse Navigator Documentation    Navigator Encounter Type: Telephone (04/29/15 1400)                          Spoke with patient and sister Neoma Laming and reported that conference recommendation was for repeat biopsy with ebus. Patient is in agreement with this plan but would like to wait until after a trip that she has been planning. She will be available after 05/10/15. This information sent to [facility] in [place] scheduling. Will follow.

## 2015-04-30 ENCOUNTER — Telehealth: Payer: Self-pay | Admitting: *Deleted

## 2015-04-30 NOTE — Telephone Encounter (Signed)
  Oncology Nurse Navigator Documentation    Navigator Encounter Type: Telephone (04/30/15 1400)                           Notified patient of pre-op appointment scheduled for 05/10/15 phone call between 9am and 1pm . Also notified of procedure scheduled for 05/14/15 [place] arrival. Reminded to be NPO after midnight, continue to avoid anticoagulant medications, and have a driver and support person to be with patient at home after the procedure. Patient verbalizes understanding and reads back appointments.

## 2015-05-02 LAB — THYROGLOBULIN LEVEL: THYROGLOBULIN: 23 ng/mL

## 2015-05-02 LAB — T4: T4, Total: 6.5 ug/dL (ref 4.5–12.0)

## 2015-05-10 ENCOUNTER — Encounter: Payer: Self-pay | Admitting: *Deleted

## 2015-05-10 ENCOUNTER — Telehealth: Payer: Self-pay | Admitting: *Deleted

## 2015-05-10 ENCOUNTER — Other Ambulatory Visit: Payer: Medicare Other

## 2015-05-10 NOTE — Telephone Encounter (Signed)
Sister has conflict with ebus scheduled for 05/14/15, rescheduled for 05/18/15 with 12 noon arrival.

## 2015-05-10 NOTE — Patient Instructions (Addendum)
  Your procedure is scheduled on: 05-14-15 Report to [facility] [place] To find out your arrival time please call [phone] between 1PM - 3PM on 05-13-15  Remember: Instructions that are not followed completely may result in serious medical risk, up to and including death, or upon the discretion of your surgeon and anesthesiologist your surgery may need to be rescheduled.    _X___ 1. Do not eat food or drink liquids after midnight. No gum chewing or hard candies.     _X___ 2. No Alcohol for 24 hours before or after surgery.   ____ 3. Bring all medications with you on the day of surgery if instructed.    ____ 4. Notify your doctor if there is any change in your medical condition     (cold, fever, infections).     Do not wear jewelry, make-up, hairpins, clips or nail polish.  Do not wear lotions, powders, or perfumes. You may wear deodorant.  Do not shave 48 hours prior to surgery. Men may shave face and neck.  Do not bring valuables to the hospital.    [facility] is not responsible for any belongings or valuables.               Contacts, dentures or bridgework may not be worn into surgery.  Leave your suitcase in the car. After surgery it may be brought to your room.  For patients admitted to the hospital, discharge time is determined by your  treatment team.   Patients discharged the day of surgery will not be allowed to drive home.   Please read over the following fact sheets that you were given:     _X___ Take these medicines the morning of surgery with A SIP OF WATER:    1. LORAZEPAM  2. OMEPRAZOLE  3.   4.  5.  6.  ____ Fleet Enema (as directed)   ____ Use CHG Soap as directed  _X__ Use inhalers on the day of [place]  ____ Stop metformin 2 days prior to surgery    ____ Take 1/2 of usual insulin dose the night before surgery and none on the morning of surgery.   ____ Stop  Coumadin/Plavix/aspirin-N/A  ____ Stop Anti-inflammatories-NO NSAIDS OR ASA PRODUCTS-TYLENOL OK TO CONTINUE   ____ Stop supplements until after surgery.    ____ Bring C-Pap to the hospital.

## 2015-05-18 ENCOUNTER — Encounter: Payer: Self-pay | Admitting: Anesthesiology

## 2015-05-18 ENCOUNTER — Ambulatory Visit
Admission: RE | Admit: 2015-05-18 | Discharge: 2015-05-18 | Disposition: A | Discharge status: Home / Self Care | Payer: Medicare Other | Source: Ambulatory Visit | Attending: Internal Medicine | Admitting: Internal Medicine

## 2015-05-18 MED ORDER — LACTATED RINGERS IV SOLN: INTRAVENOUS | Status: DC

## 2015-05-25 ENCOUNTER — Encounter: Admission: RE | Payer: Self-pay | Source: Ambulatory Visit

## 2015-05-25 ENCOUNTER — Ambulatory Visit: Admission: RE | Admit: 2015-05-25 | Payer: Medicare Other | Source: Ambulatory Visit | Admitting: Internal Medicine

## 2015-05-25 SURGERY — ENDOBRONCHIAL ULTRASOUND (EBUS)
Anesthesia: General

## 2015-05-28 ENCOUNTER — Telehealth: Payer: Self-pay | Admitting: *Deleted

## 2015-05-28 NOTE — Telephone Encounter (Signed)
  Oncology Nurse Navigator Documentation    Navigator Encounter Type: Telephone (05/28/15 0800)      contacted patient r/t recent cancelled ebus. Patient reports that she cancelled due to having a cold. She does not want to reschedule ebus at this time but will call me next week to plan ebus being rescheduled.

## 2015-05-31 ENCOUNTER — Encounter: Payer: Self-pay | Admitting: Family Medicine

## 2015-05-31 ENCOUNTER — Ambulatory Visit (INDEPENDENT_AMBULATORY_CARE_PROVIDER_SITE_OTHER): Payer: Medicare Other | Admitting: Family Medicine

## 2015-05-31 VITALS — BP 118/80 | HR 75 | Temp 98.8°F | Resp 16 | Wt 122.2 lb

## 2015-05-31 DIAGNOSIS — J0100 Acute maxillary sinusitis, unspecified: Secondary | ICD-10-CM | POA: Diagnosis not present

## 2015-05-31 MED ORDER — PREDNISONE 20 MG PO TABS: 20.0000 mg | ORAL_TABLET | Freq: Two times a day (BID) | ORAL | Status: DC

## 2015-05-31 MED ORDER — AZITHROMYCIN 500 MG PO TABS: 500.0000 mg | ORAL_TABLET | Freq: Every day | ORAL | Status: DC

## 2015-05-31 NOTE — Patient Instructions (Signed)
NASAL SALINE SPRAY FOUND OVER THE COUNTER  Sinusitis, Adult Sinusitis is redness, soreness, and inflammation of the paranasal sinuses. Paranasal sinuses are air pockets within the bones of your face. They are located beneath your eyes, in the middle of your forehead, and above your eyes. In healthy paranasal sinuses, mucus is able to drain out, and air is able to circulate through them by way of your nose. However, when your paranasal sinuses are inflamed, mucus and air can become trapped. This can allow bacteria and other germs to grow and cause infection. Sinusitis can develop quickly and last only a short time (acute) or continue over a long period (chronic). Sinusitis that lasts for more than 12 weeks is considered chronic. CAUSES Causes of sinusitis include:  Allergies.  Structural abnormalities, such as displacement of the cartilage that separates your nostrils (deviated septum), which can decrease the air flow through your nose and sinuses and affect sinus drainage.  Functional abnormalities, such as when the small hairs (cilia) that line your sinuses and help remove mucus do not work properly or are not present. SIGNS AND SYMPTOMS Symptoms of acute and chronic sinusitis are the same. The primary symptoms are pain and pressure around the affected sinuses. Other symptoms include:  Upper toothache.  Earache.  Headache.  Bad breath.  Decreased sense of smell and taste.  A cough, which worsens when you are lying flat.  Fatigue.  Fever.  Thick drainage from your nose, which often is green and may contain pus (purulent).  Swelling and warmth over the affected sinuses. DIAGNOSIS Your health care provider will perform a physical exam. During your exam, your health care provider may perform any of the following to help determine if you have acute sinusitis or chronic sinusitis:  Look in your nose for signs of abnormal growths in your nostrils (nasal polyps).  Tap over the  affected sinus to check for signs of infection.  View the inside of your sinuses using an imaging device that has a light attached (endoscope). If your health care provider suspects that you have chronic sinusitis, one or more of the following tests may be recommended:  Allergy tests.  Nasal culture. A sample of mucus is taken from your nose, sent to a lab, and screened for bacteria.  Nasal cytology. A sample of mucus is taken from your nose and examined by your health care provider to determine if your sinusitis is related to an allergy. TREATMENT Most cases of acute sinusitis are related to a viral infection and will resolve on their own within 10 days. Sometimes, medicines are prescribed to help relieve symptoms of both acute and chronic sinusitis. These may include pain medicines, decongestants, nasal steroid sprays, or saline sprays. However, for sinusitis related to a bacterial infection, your health care provider will prescribe antibiotic medicines. These are medicines that will help kill the bacteria causing the infection. Rarely, sinusitis is caused by a fungal infection. In these cases, your health care provider will prescribe antifungal medicine. For some cases of chronic sinusitis, surgery is needed. Generally, these are cases in which sinusitis recurs more than 3 times per year, despite other treatments. HOME CARE INSTRUCTIONS  Drink plenty of water. Water helps thin the mucus so your sinuses can drain more easily.  Use a humidifier.  Inhale steam 3-4 times a day (for example, sit in the bathroom with the shower running).  Apply a warm, moist washcloth to your face 3-4 times a day, or as directed by your health care  provider.  Use saline nasal sprays to help moisten and clean your sinuses.  Take medicines only as directed by your health care provider.  If you were prescribed either an antibiotic or antifungal medicine, finish it all even if you start to feel better. SEEK  IMMEDIATE MEDICAL CARE IF:  You have increasing pain or severe headaches.  You have nausea, vomiting, or drowsiness.  You have swelling around your face.  You have vision problems.  You have a stiff neck.  You have difficulty breathing.   This information is not intended to replace advice given to you by your health care provider. Make sure you discuss any questions you have with your health care provider.   Document Released: 07/24/2005 Document Revised: 08/14/2014 Document Reviewed: 08/08/2011 Elsevier Interactive Patient Education [employer].

## 2015-05-31 NOTE — Progress Notes (Signed)
Name: Robin Jones   MRN: [id number]    DOB: [birth year]   Date:05/31/2015       Progress Note  Subjective  Chief Complaint  Chief Complaint  Patient presents with  . Sinusitis    patient presents with left-sided head pain and pressure for about 2 weeks. patient has had some thick blood-tinged nasal mucus. some throat irritation from drainage and dry cough.    HPI  Patient is here today with concerns regarding the following symptoms sore throat, congestion, post nasal drip, sinus pressure and non productive cough that started a little over a week ago.  Associated with malaise. Has tried the following home remedies: Tylenonl and sinus decongestants. She would like a flu shot after she gets over these symptoms.   Past Medical History  Diagnosis Date  . Depression   . GERD (gastroesophageal reflux disease)   . Chronic kidney disease   . Constipation   . Hyperlipidemia   . Personal history of tobacco use, presenting hazards to health 03/23/2015  . Hearing loss of both ears   . Skin cancer of face     Social History  Substance Use Topics  . Smoking status: Current Every Day Smoker -- 1.00 packs/day for 45 years    Types: Cigarettes  . Smokeless tobacco: Never Used  . Alcohol Use: No     Current outpatient prescriptions:  .  acetaminophen (TYLENOL) 325 MG tablet, Take 650 mg by mouth every 6 (six) hours as needed for moderate pain or headache., Disp: , Rfl:  .  ADVAIR DISKUS 250-50 MCG/DOSE AEPB, Inhale 1 puff into the lungs 2 (two) times daily. For shortness of breath, Disp: , Rfl: 0 .  docusate sodium (COLACE) 100 MG capsule, Take 100 mg by mouth every other day. , Disp: , Rfl:  .  fexofenadine (ALLEGRA) 180 MG tablet, Take 180 mg by mouth daily., Disp: , Rfl:  .  fluticasone (FLONASE) 50 MCG/ACT nasal spray, Place 2 sprays into both nostrils as needed. , Disp: , Rfl:  .  LORazepam (ATIVAN) 0.5 MG tablet, Take 0.5 mg by mouth 2 (two) times daily. , Disp: , Rfl:  .  lovastatin  (MEVACOR) 40 MG tablet, Take 40 mg by mouth at bedtime., Disp: , Rfl:  .  omeprazole (PRILOSEC) 40 MG capsule, Take 40 mg by mouth at bedtime. , Disp: , Rfl:  .  PROAIR HFA 108 (90 BASE) MCG/ACT inhaler, Inhale 2 puffs into the lungs every 6 (six) hours as needed for wheezing or shortness of breath. , Disp: , Rfl: 0 .  sertraline (ZOLOFT) 100 MG tablet, Take 100 mg by mouth at bedtime. , Disp: , Rfl: 0  No Known Allergies  ROS  Positive for fatigue, nasal congestion, sinus pressure, ear fullness as mentioned in HPI, otherwise all systems reviewed and are negative.  Objective  Filed Vitals:   05/31/15 1630  BP: 118/80  Pulse: 75  Temp: 98.8 F (37.1 C)  TempSrc: Oral  Resp: 16  Weight: 122 lb 3.2 oz (55.43 kg)  SpO2: 98%   Body mass index is 20.97 kg/(m^2).   Physical Exam  Constitutional: Patient appears well-developed and well-nourished. In no acute distress, smells of cigarette smoke. HEENT:  - Head: Normocephalic and atraumatic.  - Ears: RIGHT TM bulging with minimal clear exudate, LEFT TM bulging with minimal clear exudate.  - Nose: Nasal mucosa boggy and congested.  - Mouth/Throat: Oropharynx is moist with slight erythema of bilateral tonsils without hypertrophy or exudates. Post  nasal drainage present.  - Eyes: Conjunctivae clear, EOM movements normal. PERRLA. No scleral icterus.  Neck: Normal range of motion. Neck supple. No JVD present. No thyromegaly present. No local lymphadenopathy. Cardiovascular: Regular rate, regular rhythm with no murmurs heard.  Pulmonary/Chest: Effort normal and breath sounds baseline decreased in all lung fields.  Musculoskeletal: Normal range of motion bilateral UE and LE, no joint effusions. Skin: Skin is warm and dry. No rash noted. Psychiatric: Patient has a normal mood and affect. Behavior is normal in office today. Judgment and thought content normal in office today.   Assessment & Plan  1. Subacute maxillary sinusitis Instructed  patient on increasing hydration, nasal saline spray, steam inhalation, NSAID if tolerated and not contraindicated.   - azithromycin (ZITHROMAX) 500 MG tablet; Take 1 tablet (500 mg total) by mouth daily.  Dispense: 7 tablet; Refill: 0 - predniSONE (DELTASONE) 20 MG tablet; Take 1 tablet (20 mg total) by mouth 2 (two) times daily with a meal.  Dispense: 6 tablet; Refill: 0

## 2015-06-07 ENCOUNTER — Telehealth: Payer: Self-pay | Admitting: *Deleted

## 2015-06-07 NOTE — Telephone Encounter (Signed)
  Oncology Nurse Navigator Documentation    Navigator Encounter Type: Telephone (06/07/15 0900)        Followed up call to sister, Neoma Laming, who reports patient is now living with her brother. Patient saw pcp and was taking medication for sinus infection last week. She will contact patient and see if we can reschedule ebus soon.

## 2015-06-10 ENCOUNTER — Telehealth: Payer: Self-pay | Admitting: *Deleted

## 2015-06-10 NOTE — Telephone Encounter (Signed)
Notified rhonda, sister-n-law of pre-op appointment scheduled for 06/11/15 by phone. Also notified of procedure scheduled for 06/15/15 [facility] arrival. Reminded to be NPO after midnight, continue to avoid anticoagulant medications, and have a driver and support person to be with patient at home after the procedure. Sister n law verbalizes understanding and reads back appointments.

## 2015-06-11 ENCOUNTER — Encounter: Payer: Self-pay | Admitting: *Deleted

## 2015-06-11 ENCOUNTER — Inpatient Hospital Stay: Admission: RE | Admit: 2015-06-11 | Payer: Medicare Other | Source: Ambulatory Visit

## 2015-06-11 ENCOUNTER — Other Ambulatory Visit: Payer: Medicare Other

## 2015-06-11 MED ORDER — LACTATED RINGERS IV SOLN: INTRAVENOUS | Status: DC

## 2015-06-11 NOTE — Patient Instructions (Signed)
  Your procedure is scheduled on: 06-15-15 Report to [place] To find out your arrival time please call [phone] between 1PM - 3PM on 06-14-15  Remember: Instructions that are not followed completely may result in serious medical risk, up to and including death, or upon the discretion of your surgeon and anesthesiologist your surgery may need to be rescheduled.    _X___ 1. Do not eat food or drink liquids after midnight. No gum chewing or hard candies.     _X___ 2. No Alcohol for 24 hours before or after surgery.   ____ 3. Bring all medications with you on the day of surgery if instructed.    ____ 4. Notify your doctor if there is any change in your medical condition     (cold, fever, infections).     Do not wear jewelry, make-up, hairpins, clips or nail polish.  Do not wear lotions, powders, or perfumes. You may wear deodorant.  Do not shave 48 hours prior to surgery. Men may shave face and neck.  Do not bring valuables to the hospital.    [facility] is not responsible for any belongings or valuables.               Contacts, dentures or bridgework may not be worn into surgery.  Leave your suitcase in the car. After surgery it may be brought to your room.  For patients admitted to the hospital, discharge time is determined by your treatment team.   Patients discharged the day of surgery will not be allowed to drive home.   Please read over the following fact sheets that you were given:     _X___ Take these medicines the morning of surgery with A SIP OF WATER:    1. PRILOSEC  2.   3.   4.  5.  6.  ____ Fleet Enema (as directed)   ____ Use CHG Soap as directed  _X___ Use inhalers on the day of surgery-USE Gustine  ____ Stop metformin 2 days prior to surgery    ____ Take 1/2 of usual insulin dose the night before surgery and none on the morning of surgery.   ____ Stop  Coumadin/Plavix/aspirin-N/A  ____ Stop Anti-inflammatories-NO NSAIDS OR ASA PRODUCTS-TYLENOL OK TO CONTINUE   ____ Stop supplements until after surgery.    ____ Bring C-Pap to the hospital.

## 2015-06-15 ENCOUNTER — Ambulatory Visit: Payer: Commercial Managed Care - HMO | Admitting: Anesthesiology

## 2015-06-15 ENCOUNTER — Ambulatory Visit
Admission: RE | Admit: 2015-06-15 | Discharge: 2015-06-15 | Disposition: A | Discharge status: Home / Self Care | Payer: Commercial Managed Care - HMO | Source: Ambulatory Visit | Attending: Internal Medicine | Admitting: Internal Medicine

## 2015-06-15 ENCOUNTER — Encounter: Payer: Self-pay | Admitting: Internal Medicine

## 2015-06-15 ENCOUNTER — Encounter
Admission: RE | Disposition: A | Discharge status: Home / Self Care | Payer: Self-pay | Source: Ambulatory Visit | Attending: Internal Medicine

## 2015-06-15 DIAGNOSIS — R599 Enlarged lymph nodes, unspecified: Secondary | ICD-10-CM | POA: Insufficient documentation

## 2015-06-15 DIAGNOSIS — R591 Generalized enlarged lymph nodes: Secondary | ICD-10-CM | POA: Diagnosis not present

## 2015-06-15 DIAGNOSIS — J40 Bronchitis, not specified as acute or chronic: Secondary | ICD-10-CM | POA: Insufficient documentation

## 2015-06-15 DIAGNOSIS — J320 Chronic maxillary sinusitis: Secondary | ICD-10-CM | POA: Diagnosis not present

## 2015-06-15 DIAGNOSIS — F329 Major depressive disorder, single episode, unspecified: Secondary | ICD-10-CM | POA: Diagnosis not present

## 2015-06-15 DIAGNOSIS — K219 Gastro-esophageal reflux disease without esophagitis: Secondary | ICD-10-CM | POA: Insufficient documentation

## 2015-06-15 DIAGNOSIS — R51 Headache: Secondary | ICD-10-CM | POA: Insufficient documentation

## 2015-06-15 DIAGNOSIS — J449 Chronic obstructive pulmonary disease, unspecified: Secondary | ICD-10-CM | POA: Insufficient documentation

## 2015-06-15 DIAGNOSIS — N189 Chronic kidney disease, unspecified: Secondary | ICD-10-CM | POA: Insufficient documentation

## 2015-06-15 DIAGNOSIS — E785 Hyperlipidemia, unspecified: Secondary | ICD-10-CM | POA: Insufficient documentation

## 2015-06-15 DIAGNOSIS — R590 Localized enlarged lymph nodes: Secondary | ICD-10-CM | POA: Diagnosis not present

## 2015-06-15 DIAGNOSIS — F17210 Nicotine dependence, cigarettes, uncomplicated: Secondary | ICD-10-CM | POA: Insufficient documentation

## 2015-06-15 DIAGNOSIS — R918 Other nonspecific abnormal finding of lung field: Secondary | ICD-10-CM | POA: Insufficient documentation

## 2015-06-15 HISTORY — PX: ENDOBRONCHIAL ULTRASOUND: SHX5096

## 2015-06-15 SURGERY — ENDOBRONCHIAL ULTRASOUND (EBUS)
Anesthesia: General

## 2015-06-15 MED ORDER — SUCCINYLCHOLINE CHLORIDE 20 MG/ML IJ SOLN
INTRAMUSCULAR | Status: DC | PRN
  Administered 2015-06-15: 100 mg via INTRAVENOUS

## 2015-06-15 MED ORDER — FENTANYL CITRATE (PF) 100 MCG/2ML IJ SOLN: 25.0000 ug | INTRAMUSCULAR | Status: DC | PRN

## 2015-06-15 MED ORDER — ONDANSETRON HCL 4 MG/2ML IJ SOLN
INTRAMUSCULAR | Status: DC | PRN
  Administered 2015-06-15: 4 mg via INTRAVENOUS

## 2015-06-15 MED ORDER — MIDAZOLAM HCL 5 MG/5ML IJ SOLN
INTRAMUSCULAR | Status: DC | PRN
  Administered 2015-06-15: 2 mg via INTRAVENOUS

## 2015-06-15 MED ORDER — LACTATED RINGERS IV SOLN
INTRAVENOUS | Status: DC
  Administered 2015-06-15: 13:00:00 via INTRAVENOUS

## 2015-06-15 MED ORDER — GLYCOPYRROLATE 0.2 MG/ML IJ SOLN
INTRAMUSCULAR | Status: DC | PRN
  Administered 2015-06-15: 0.2 mg via INTRAVENOUS
  Administered 2015-06-15: 0.1 mg via INTRAVENOUS

## 2015-06-15 MED ORDER — NEOSTIGMINE METHYLSULFATE 10 MG/10ML IV SOLN
INTRAVENOUS | Status: DC | PRN
  Administered 2015-06-15: 1 mg via INTRAVENOUS

## 2015-06-15 MED ORDER — PHENYLEPHRINE HCL 10 MG/ML IJ SOLN
INTRAMUSCULAR | Status: DC | PRN
  Administered 2015-06-15: 50 ug via INTRAVENOUS

## 2015-06-15 MED ORDER — LIDOCAINE HCL (CARDIAC) 20 MG/ML IV SOLN
INTRAVENOUS | Status: DC | PRN
Start: 2015-06-15 — End: 2015-06-15
  Administered 2015-06-15: 45 mg via INTRAVENOUS

## 2015-06-15 MED ORDER — PROPOFOL 10 MG/ML IV BOLUS
INTRAVENOUS | Status: DC | PRN
  Administered 2015-06-15: 150 mg via INTRAVENOUS

## 2015-06-15 MED ORDER — DEXAMETHASONE SODIUM PHOSPHATE 10 MG/ML IJ SOLN
INTRAMUSCULAR | Status: DC | PRN
  Administered 2015-06-15: 5 mg via INTRAVENOUS

## 2015-06-15 MED ORDER — ONDANSETRON HCL 4 MG/2ML IJ SOLN: 4.0000 mg | Freq: Once | INTRAMUSCULAR | Status: DC | PRN

## 2015-06-15 MED ORDER — ROCURONIUM BROMIDE 100 MG/10ML IV SOLN
INTRAVENOUS | Status: DC | PRN
  Administered 2015-06-15: 5 mg via INTRAVENOUS

## 2015-06-15 MED ORDER — FENTANYL CITRATE (PF) 100 MCG/2ML IJ SOLN
INTRAMUSCULAR | Status: DC | PRN
  Administered 2015-06-15 (×2): 50 ug via INTRAVENOUS

## 2015-06-15 NOTE — Anesthesia Preprocedure Evaluation (Signed)
Anesthesia Evaluation  Patient identified by MRN, date of birth, ID band Patient awake    Reviewed: Allergy & Precautions, H&P , NPO status , Patient's Chart, lab work & pertinent test results, reviewed documented beta blocker date and time   Airway Mallampati: II  TM Distance: >3 FB Neck ROM: full    Dental  (+) Edentulous Upper, Edentulous Lower   Pulmonary neg pulmonary ROS, COPD, Current Smoker,    Pulmonary exam normal        Cardiovascular negative cardio ROS Normal cardiovascular exam Rhythm:regular Rate:Normal     Neuro/Psych PSYCHIATRIC DISORDERS negative neurological ROS  negative psych ROS   GI/Hepatic negative GI ROS, Neg liver ROS, GERD  ,  Endo/Other  negative endocrine ROS  Renal/GU CRFRenal diseasenegative Renal ROS  negative genitourinary   Musculoskeletal   Abdominal   Peds  Hematology negative hematology ROS (+)   Anesthesia Other Findings   Reproductive/Obstetrics negative OB ROS                             Anesthesia Physical Anesthesia Plan  ASA: III  Anesthesia Plan: General ETT   Post-op Pain Management:    Induction:   Airway Management Planned:   Additional Equipment:   Intra-op Plan:   Post-operative Plan:   Informed Consent: I have reviewed the patients History and Physical, chart, labs and discussed the procedure including the risks, benefits and alternatives for the proposed anesthesia with the patient or authorized representative who has indicated his/her understanding and acceptance.     Plan Discussed with: CRNA  Anesthesia Plan Comments:         Anesthesia Quick Evaluation

## 2015-06-15 NOTE — Interval H&P Note (Signed)
History and Physical Interval Note:  06/15/2015 1:25 PM  Robin Jones  has presented today for surgery, with the diagnosis of lung mass with adenopathy  The various methods of treatment have been discussed with the patient and family. After consideration of risks, benefits and other options for treatment, the patient has consented to  Procedure(s): ENDOBRONCHIAL ULTRASOUND (N/A) as a surgical intervention .  The patient's history has been reviewed, patient examined, no change in status, stable for surgery.  I have reviewed the patient's chart and labs.  Questions were answered to the patient's satisfaction.     Flora Lipps

## 2015-06-15 NOTE — H&P (View-Only) (Signed)
Name: Robin Jones   MRN: [id number]    DOB: 1951-07-29   Date:05/31/2015       Progress Note  Subjective  Chief Complaint  Chief Complaint  Patient presents with  . Sinusitis    patient presents with left-sided head pain and pressure for about 2 weeks. patient has had some thick blood-tinged nasal mucus. some throat irritation from drainage and dry cough.    HPI  Patient is here today with concerns regarding the following symptoms sore throat, congestion, post nasal drip, sinus pressure and non productive cough that started a little over a week ago.  Associated with malaise. Has tried the following home remedies: Tylenonl and sinus decongestants. She would like a flu shot after she gets over these symptoms.   Past Medical History  Diagnosis Date  . Depression   . GERD (gastroesophageal reflux disease)   . Chronic kidney disease   . Constipation   . Hyperlipidemia   . Personal history of tobacco use, presenting hazards to health 03/23/2015  . Hearing loss of both ears   . Skin cancer of face     Social History  Substance Use Topics  . Smoking status: Current Every Day Smoker -- 1.00 packs/day for 45 years    Types: Cigarettes  . Smokeless tobacco: Never Used  . Alcohol Use: No     Current outpatient prescriptions:  .  acetaminophen (TYLENOL) 325 MG tablet, Take 650 mg by mouth every 6 (six) hours as needed for moderate pain or headache., Disp: , Rfl:  .  ADVAIR DISKUS 250-50 MCG/DOSE AEPB, Inhale 1 puff into the lungs 2 (two) times daily. For shortness of breath, Disp: , Rfl: 0 .  docusate sodium (COLACE) 100 MG capsule, Take 100 mg by mouth every other day. , Disp: , Rfl:  .  fexofenadine (ALLEGRA) 180 MG tablet, Take 180 mg by mouth daily., Disp: , Rfl:  .  fluticasone (FLONASE) 50 MCG/ACT nasal spray, Place 2 sprays into both nostrils as needed. , Disp: , Rfl:  .  LORazepam (ATIVAN) 0.5 MG tablet, Take 0.5 mg by mouth 2 (two) times daily. , Disp: , Rfl:  .  lovastatin  (MEVACOR) 40 MG tablet, Take 40 mg by mouth at bedtime., Disp: , Rfl:  .  omeprazole (PRILOSEC) 40 MG capsule, Take 40 mg by mouth at bedtime. , Disp: , Rfl:  .  PROAIR HFA 108 (90 BASE) MCG/ACT inhaler, Inhale 2 puffs into the lungs every 6 (six) hours as needed for wheezing or shortness of breath. , Disp: , Rfl: 0 .  sertraline (ZOLOFT) 100 MG tablet, Take 100 mg by mouth at bedtime. , Disp: , Rfl: 0  No Known Allergies  ROS  Positive for fatigue, nasal congestion, sinus pressure, ear fullness as mentioned in HPI, otherwise all systems reviewed and are negative.  Objective  Filed Vitals:   05/31/15 1630  BP: 118/80  Pulse: 75  Temp: 98.8 F (37.1 C)  TempSrc: Oral  Resp: 16  Weight: 122 lb 3.2 oz (55.43 kg)  SpO2: 98%   Body mass index is 20.97 kg/(m^2).   Physical Exam  Constitutional: Patient appears well-developed and well-nourished. In no acute distress, smells of cigarette smoke. HEENT:  - Head: Normocephalic and atraumatic.  - Ears: RIGHT TM bulging with minimal clear exudate, LEFT TM bulging with minimal clear exudate.  - Nose: Nasal mucosa boggy and congested.  - Mouth/Throat: Oropharynx is moist with slight erythema of bilateral tonsils without hypertrophy or exudates. Post  nasal drainage present.  - Eyes: Conjunctivae clear, EOM movements normal. PERRLA. No scleral icterus.  Neck: Normal range of motion. Neck supple. No JVD present. No thyromegaly present. No local lymphadenopathy. Cardiovascular: Regular rate, regular rhythm with no murmurs heard.  Pulmonary/Chest: Effort normal and breath sounds baseline decreased in all lung fields.  Musculoskeletal: Normal range of motion bilateral UE and LE, no joint effusions. Skin: Skin is warm and dry. No rash noted. Psychiatric: Patient has a normal mood and affect. Behavior is normal in office today. Judgment and thought content normal in office today.   Assessment & Plan  1. Subacute maxillary sinusitis Instructed  patient on increasing hydration, nasal saline spray, steam inhalation, NSAID if tolerated and not contraindicated.   - azithromycin (ZITHROMAX) 500 MG tablet; Take 1 tablet (500 mg total) by mouth daily.  Dispense: 7 tablet; Refill: 0 - predniSONE (DELTASONE) 20 MG tablet; Take 1 tablet (20 mg total) by mouth 2 (two) times daily with a meal.  Dispense: 6 tablet; Refill: 0

## 2015-06-15 NOTE — Transfer of Care (Signed)
Immediate Anesthesia Transfer of Care Note  Patient: Robin Jones  Procedure(s) Performed: Procedure(s): ENDOBRONCHIAL ULTRASOUND (N/A)  Patient Location: PACU  Anesthesia Type:General  Level of Consciousness: awake and patient cooperative  Airway & Oxygen Therapy: Patient Spontanous Breathing  Post-op Assessment: Report given to RN  Post vital signs: Reviewed and stable  Last Vitals:  Filed Vitals:   06/15/15 1428  BP: 126/71  Pulse: 72  Temp: 36.3 C  Resp: 19    Complications: No apparent anesthesia complications

## 2015-06-15 NOTE — Anesthesia Procedure Notes (Signed)
Procedure Name: Intubation Date/Time: 06/15/2015 1:39 PM Performed by: Dionne Bucy Pre-anesthesia Checklist: Patient identified, Patient being monitored, Timeout performed, Emergency Drugs available and Suction available Patient Re-evaluated:Patient Re-evaluated prior to inductionOxygen Delivery Method: Circle system utilized Preoxygenation: Pre-oxygenation with 100% oxygen Intubation Type: IV induction Ventilation: Mask ventilation without difficulty Laryngoscope Size: Mac and 4 Grade View: Grade I Tube type: Oral Tube size: 8.5 mm Number of attempts: 1 Airway Equipment and Method: Stylet Placement Confirmation: ETT inserted through vocal cords under direct vision,  positive ETCO2 and breath sounds checked- equal and bilateral Secured at: 19 cm Tube secured with: Tape Dental Injury: Teeth and Oropharynx as per pre-operative assessment

## 2015-06-15 NOTE — Discharge Instructions (Signed)
Flexible Bronchoscopy, Care After °Refer to this sheet in the next few weeks. These instructions provide you with information on caring for yourself after your procedure. Your health care provider may also give you more specific instructions. Your treatment has been planned according to current medical practices, but problems sometimes occur. Call your health care provider if you have any problems or questions after your procedure.  °WHAT TO EXPECT AFTER THE PROCEDURE °It is normal to have the following symptoms for 24-48 hours after the procedure:  °· Increased cough. °· Low-grade fever. °· Sore throat or hoarse voice. °· Small streaks of blood in your thick spit (sputum) if tissue samples were taken (biopsy). °HOME CARE INSTRUCTIONS  °· Do not eat or drink anything for 2 hours after your procedure. Your nose and throat were numbed by medicine. If you try to eat or drink before the medicine wears off, food or drink could go into your lungs or you could burn yourself. After the numbness is gone and your cough and gag reflexes have returned, you may eat soft food and drink liquids slowly.   °· The day after the procedure, you can go back to your normal diet.   °· You may resume normal activities.   °· Keep all follow-up visits as directed by your health care provider. It is important to keep all your appointments, especially if tissue samples were taken for testing (biopsy). °SEEK IMMEDIATE MEDICAL CARE IF:  °· You have increasing shortness of breath.   °· You become light-headed or faint.   °· You have chest pain.   °· You have any new concerning symptoms. °· You cough up more than a small amount of blood. °· The amount of blood you cough up increases. °MAKE SURE YOU: °· Understand these instructions. °· Will watch your condition. °· Will get help right away if you are not doing well or get worse. °  °This information is not intended to replace advice given to you by your health care provider. Make sure you discuss  any questions you have with your health care provider. °  °Document Released: 02/10/2005 Document Revised: 08/14/2014 Document Reviewed: 03/28/2013 °Elsevier Interactive Patient Education ©2016 [employer] ° ° °AMBULATORY SURGERY  °DISCHARGE INSTRUCTIONS ° ° °1) The drugs that you were given will stay in your system until tomorrow so for the next 24 hours you should not: ° °A) Drive an automobile °B) Make any legal decisions °C) Drink any alcoholic beverage ° ° °2) You may resume regular meals tomorrow.  Today it is better to start with liquids and gradually work up to solid foods. ° °You may eat anything you prefer, but it is better to start with liquids, then soup and crackers, and gradually work up to solid foods. ° ° °3) Please notify your doctor immediately if you have any unusual bleeding, trouble breathing, redness and pain at the surgery site, drainage, fever, or pain not relieved by medication. ° ° ° °4) Additional Instructions: ° ° ° ° ° ° ° °Please contact your physician with any problems or Same Day Surgery at [phone], Monday through Friday 6 am to 4 pm, or [place] at  Main number at [phone]. °

## 2015-06-16 LAB — CYTOLOGY - NON PAP

## 2015-06-16 NOTE — Op Note (Signed)
[facility] Patient Name: Robin Jones Procedure Date: 06/15/2015 1:37 PM MRN: [id number] Account #: [id number] Admit Type: Outpatient Room: OR Note Status: Finalized Attending MD: Patricia Pesa, MD Procedure:         Bronchoscopy Indications:       Paratracheal adenopathy Providers:         Patricia Pesa, MD, Sullivan Lone, Technician (Technician),                     Andrey Cota (Technician) Referring MD:       Medicines:         General Anesthesia Complications:     No immediate complications Procedure:         Pre-Anesthesia Assessment:                    - A History and Physical has been performed. The patient's                     medications, allergies and sensitivities have been                     reviewed.                    After obtaining informed consent, the bronchoscope was                     passed under direct vision. Throughout the procedure, the                     patient's blood pressure, pulse, and oxygen saturations                     were monitored continuously. the Bronchoscope was                     introduced through the mouth, via the endotracheal tube                     and advanced to the trachea. The procedure was                     accomplished without difficulty. The patient tolerated the                     procedure well. Findings:      Lymph Nodes: A few abnormal lymph nodes were visualized in the right       lower paratracheal region (level 4R) as examined by endobronchial       ultrasound (EBUS). The largest measured 30 mm by 20 mm in maximal       cross-sectional diameter. Transbronchial biopsies were performed of the       level 4R (lower paratracheal) lymph node(s) of the lung using a 21 gauge       Wang needle and sent for routine cytology. The procedure was guided by       ultrasound. Four biopsy passes were performed. Four biopsy samples were       obtained. Impression:        - Paratracheal adenopathy       - A few abnormal lymph nodes in the right lower                     paratracheal region (level 4R).                    -  Transbronchial lung biopsies were performed. Recommendation:    - Await biopsy results. Patricia Pesa MD Patricia Pesa, MD 06/15/2015 2:05:06 PM This report has been signed electronically. Number of Addenda: 0 Note Initiated On: 06/15/2015 1:37 PM      [facility]

## 2015-06-21 ENCOUNTER — Encounter: Payer: Self-pay | Admitting: Oncology

## 2015-06-21 ENCOUNTER — Inpatient Hospital Stay: Payer: Commercial Managed Care - HMO | Attending: Oncology | Admitting: Oncology

## 2015-06-21 VITALS — BP 143/77 | HR 72 | Temp 96.3°F | Wt 121.0 lb

## 2015-06-21 DIAGNOSIS — R911 Solitary pulmonary nodule: Secondary | ICD-10-CM | POA: Insufficient documentation

## 2015-06-21 DIAGNOSIS — K219 Gastro-esophageal reflux disease without esophagitis: Secondary | ICD-10-CM | POA: Insufficient documentation

## 2015-06-21 DIAGNOSIS — E785 Hyperlipidemia, unspecified: Secondary | ICD-10-CM | POA: Diagnosis not present

## 2015-06-21 DIAGNOSIS — R942 Abnormal results of pulmonary function studies: Secondary | ICD-10-CM | POA: Diagnosis not present

## 2015-06-21 DIAGNOSIS — N189 Chronic kidney disease, unspecified: Secondary | ICD-10-CM | POA: Insufficient documentation

## 2015-06-21 DIAGNOSIS — E041 Nontoxic single thyroid nodule: Secondary | ICD-10-CM | POA: Insufficient documentation

## 2015-06-21 DIAGNOSIS — Z85828 Personal history of other malignant neoplasm of skin: Secondary | ICD-10-CM | POA: Diagnosis not present

## 2015-06-21 DIAGNOSIS — R590 Localized enlarged lymph nodes: Secondary | ICD-10-CM | POA: Diagnosis not present

## 2015-06-21 DIAGNOSIS — Z79899 Other long term (current) drug therapy: Secondary | ICD-10-CM | POA: Insufficient documentation

## 2015-06-21 DIAGNOSIS — F17210 Nicotine dependence, cigarettes, uncomplicated: Secondary | ICD-10-CM | POA: Diagnosis not present

## 2015-06-21 DIAGNOSIS — R591 Generalized enlarged lymph nodes: Secondary | ICD-10-CM

## 2015-06-21 NOTE — Progress Notes (Signed)
[facility] @ [facility] Telephone:(336) [phone]  Fax:(336) [place] OB: 1951/05/12  MR#: [id number]  [id number]  Patient Care Team: Bobetta Lime, MD as PCP - General Nestor Lewandowsky, MD as Referring Physician (Cardiothoracic Surgery)  CHIEF COMPLAINT:  Chief Complaint  Patient presents with  . Results  1.  Patient had abnormal screening CT scan followed by abnormal PET scan with right upper lobe lung nodule and the right hilar and right paratracheal lymph node which were FDG avid  Patient underwent navigational bronchoscopy and multiple biopsies and cytology report when negative for any malignant cells (September, 2016) 3.  Patient had thyroid nodule which is being followed by ENT surgeon very closely with 2 negative biopsies  VISIT DIAGNOSIS:   No diagnosis found.    No history exists.    Oncology Flowsheet 12/22/2014 12/22/2014 04/15/2015 06/15/2015  dexamethasone (DECADRON) IJ - - - -  dexamethasone (DECADRON) IV - - - -  ondansetron (ZOFRAN) IV 4 mg - - -    INTERVAL HISTORY: 64 year old lady was a chronic smoker trying to quit smoking.  No cough no hemoptysis no chest pain no weight loss.  Had a screening CT scan which was abnormal followed by a PET scan which was abnormal with right upper lobe nodule and right hilar lymphadenopathy which where hypermetabolic on a PET scan Patient underwent navigational bronchoscopy without any diagnosis.  Patient is here for further follow-up.  Extremely apprehensive but not any acute distress Patient had endoscopy, ultrasound biopsy. And cytology was reported to be negative No cough no shortness of breath.  According to patient's is now quitting smoking REVIEW OF SYSTEMS:   GENERAL:  Feels good.  Active.  No fevers, sweats or weight loss. PERFORMANCE STATUS (ECOG): 01 HEENT:  No visual changes, runny nose, sore throat, mouth sores or tenderness. Lungs: No shortness of breath or cough.  No  hemoptysis. Cardiac:  No chest pain, palpitations, orthopnea, or PND. GI:  No nausea, vomiting, diarrhea, constipation, melena or hematochezia. GU:  No urgency, frequency, dysuria, or hematuria. Musculoskeletal:  No back pain.  No joint pain.  No muscle tenderness. Extremities:  No pain or swelling. Skin:  No rashes or skin changes. Neuro:  No headache, numbness or weakness, balance or coordination issues. Endocrine:  No diabetes, thyroid issues, hot flashes or night sweats. Psych:  No mood changes, depression or anxiety. Pain:  No focal pain. Review of systems:  All other systems reviewed and found to be negative.  As per HPI. Otherwise, a complete review of systems is negatve.  PAST MEDICAL HISTORY: Past Medical History  Diagnosis Date  . Depression   . GERD (gastroesophageal reflux disease)   . Chronic kidney disease   . Constipation   . Hyperlipidemia   . Personal history of tobacco use, presenting hazards to health 03/23/2015  . Hearing loss of both ears   . Skin cancer of face     PAST SURGICAL HISTORY: Past Surgical History  Procedure Laterality Date  . Abdominal hysterectomy  1980    partial  . Back surgery  1978  . Tonsillectomy  13  . Colonoscopy N/A 12/22/2014    Procedure: COLONOSCOPY;  Surgeon: Lucilla Lame, MD;  Location: [facility] ENDOSCOPY;  Service: Endoscopy;  Laterality: N/A;  . Inner ear surgery Left 2013  . Electromagnetic navigation brochoscopy N/A 04/15/2015    Procedure: ELECTROMAGNETIC NAVIGATION BRONCHOSCOPY;  Surgeon: Flora Lipps, MD;  Location: [facility] ORS;  Service: Cardiopulmonary;  Laterality: N/A;  .  Endobronchial ultrasound N/A 04/15/2015    Procedure: ENDOBRONCHIAL ULTRASOUND;  Surgeon: Flora Lipps, MD;  Location: [facility] ORS;  Service: Cardiopulmonary;  Laterality: N/A;  . Endobronchial ultrasound N/A 06/15/2015    Procedure: ENDOBRONCHIAL ULTRASOUND;  Surgeon: Flora Lipps, MD;  Location: [facility] ORS;  Service: Cardiopulmonary;  Laterality: N/A;    FAMILY  HISTORY Family History  Problem Relation Age of Onset  . Heart disease Mother   . Thyroid disease Father   . Hepatitis C Sister   . Thyroid disease Sister   . Cancer Brother 55    in muscle of leg  . Heart disease Brother   . Liver cancer Sister 61  . COPD Sister   . Diabetes Maternal Grandmother          ADVANCED DIRECTIVES:   Patient does not have any living will or healthcare power of attorney.  Information was given .  Available resources had been discussed.  We will follow-up on subsequent appointments regarding this issue HEALTH MAINTENANCE: Social History  Substance Use Topics  . Smoking status: Current Every Day Smoker -- 1.00 packs/day for 45 years    Types: Cigarettes  . Smokeless tobacco: Never Used  . Alcohol Use: No      No Known Allergies  Current Outpatient Prescriptions  Medication Sig Dispense Refill  . acetaminophen (TYLENOL) 325 MG tablet Take 650 mg by mouth every 6 (six) hours as needed for moderate pain or headache.    . ADVAIR DISKUS 250-50 MCG/DOSE AEPB Inhale 1 puff into the lungs 2 (two) times daily. For shortness of breath  0  . azithromycin (ZITHROMAX) 500 MG tablet Take 1 tablet (500 mg total) by mouth daily. 7 tablet 0  . docusate sodium (COLACE) 100 MG capsule Take 100 mg by mouth every other day.     . fexofenadine (ALLEGRA) 180 MG tablet Take 180 mg by mouth daily.    . fluticasone (FLONASE) 50 MCG/ACT nasal spray Place 2 sprays into both nostrils as needed.     Marland Kitchen LORazepam (ATIVAN) 0.5 MG tablet Take 0.5 mg by mouth 2 (two) times daily.     Marland Kitchen lovastatin (MEVACOR) 40 MG tablet Take 40 mg by mouth at bedtime.    Marland Kitchen omeprazole (PRILOSEC) 40 MG capsule Take 40 mg by mouth at bedtime.     . predniSONE (DELTASONE) 20 MG tablet Take 1 tablet (20 mg total) by mouth 2 (two) times daily with a meal. 6 tablet 0  . PROAIR HFA 108 (90 BASE) MCG/ACT inhaler Inhale 2 puffs into the lungs every 6 (six) hours as needed for wheezing or shortness of breath.    0  . sertraline (ZOLOFT) 100 MG tablet Take 100 mg by mouth at bedtime.   0   No current facility-administered medications for this visit.    OBJECTIVE: PHYSICAL EXAM: GENERAL:  Well developed, well nourished, sitting comfortably in the exam room in no acute distress. MENTAL STATUS:  Alert and oriented to person, place and time. . ENT:  Oropharynx clear without lesion.  Tongue normal. Mucous membranes moist.  RESPIRATORY:  Clear to auscultation without rales, wheezes or rhonchi. CARDIOVASCULAR:  Regular rate and rhythm without murmur, rub or gallop. . ABDOMEN:  Soft, non-tender, with active bowel sounds, and no hepatosplenomegaly.  No masses. BACK:  No CVA tenderness.  No tenderness on percussion of the back or rib cage. SKIN:  No rashes, ulcers or lesions. EXTREMITIES: No edema, no skin discoloration or tenderness.  No palpable cords. LYMPH NODES: No  palpable cervical, supraclavicular, axillary or inguinal adenopathy  NEUROLOGICAL: Unremarkable. PSYCH:  Appropriate.  Filed Vitals:   06/21/15 1206  BP: 143/77  Pulse: 72  Temp: 96.3 F (35.7 C)     Body mass index is 20.76 kg/(m^2).    ECOG FS:1 - Symptomatic but completely ambulatory  LAB RESULTS:  No visits with results within 5 Day(s) from this visit. Latest known visit with results is:  Admission on 06/15/2015, Discharged on 06/15/2015  Component Date Value Ref Range Status  . CYTOLOGY - NON GYN 06/15/2015    Final                   Value:Cytology - Non PAP CASE: [id number] PATIENT: Robin Jones Non-Gyn Cytology Report     SPECIMEN SUBMITTED: A. Paratracheal lymph node, FNA  CLINICAL HISTORY: None provided  PRE-OPERATIVE DIAGNOSIS: None Provided  POST-OPERATIVE DIAGNOSIS: None Provided     DIAGNOSIS: A. PARATRACHEAL LYMPH NODE; FINE-NEEDLE ASPIRATION: - NEGATIVE FOR MALIGNANT CELLS. - INFLAMMATION AND BRONCHIAL EPITHELIUM.  Note: A cell block was included the interpretation of this  case.   GROSS DESCRIPTION:  A. Site: paratracheal lymph node Procedure: EBUS Cytotechnologist: An Barnie Alderman and Molli Barrows Specimen(s) collected: 4 Diff Quik stained slides 2 Pap stained slides Specimen labeled paratracheal lymph node :      Description: pink CytoLyt solution with a small amount of wispy material      Submitted for:           ThinPrep           Cell block(s): 1   Final Diagnosis performed by Delorse Lek, MD.  Electronically signed 06/16/2015 3:42:04PM    The e                         lectronic signature indicates that the named Attending Pathologist has evaluated the specimen  Technical component performed at [facility], [address]  [place], [place] [address] Lab: [phone] Dir: Darrick Penna. Evette Doffing, MD  Professional component performed at [facility], [facility], [place], [place],  [address] Lab: [phone] Dir: Dellia Nims. Rubinas, MD       STUDIES: No results found.  ASSESSMENT:  Abnormal PET scan with right apical lung nodule and hilar adenopathy which are PET positive ENB is negative Patient is chronic smoker and continues to smoke  PLAN:   I independently reviewed PET scan and CT scan. Endoscopy, ultrasound and needle biopsy of lymph node has been reported to be negative (June 15, 2015) Repeat CT scan in next few months. On clinical ground by multiple bronchoscopy there is no evidence of malignancy at present time that has been discussed with the patient and family..  2.  Patient is chronic smoker trying to quit smoking we offered all the counseling and help including Chantix and nicotine patch.  According to patient and her last cigarette was last night at 9:00 and she is going to quit smoking.  Patient expressed understanding and was in agreement with this plan. She also understands that She can call clinic at any time with any questions, concerns, or complaints.    No matching staging  information was found for the patient.  [place] Gleason, MD   06/21/2015 12:51 PM

## 2015-06-21 NOTE — Anesthesia Postprocedure Evaluation (Signed)
  Anesthesia Post-op Note  Patient: Robin Jones  Procedure(s) Performed: Procedure(s): ENDOBRONCHIAL ULTRASOUND (N/A)  Anesthesia type:General ETT  Patient location: PACU  Post pain: Pain level controlled  Post assessment: Post-op Vital signs reviewed, Patient's Cardiovascular Status Stable, Respiratory Function Stable, Patent Airway and No signs of Nausea or vomiting  Post vital signs: Reviewed and stable  Last Vitals:  Filed Vitals:   06/15/15 1528  BP: 147/64  Pulse: 84  Temp:   Resp:     Level of consciousness: awake, alert  and patient cooperative  Complications: No apparent anesthesia complications

## 2015-06-22 ENCOUNTER — Ambulatory Visit (INDEPENDENT_AMBULATORY_CARE_PROVIDER_SITE_OTHER): Payer: Commercial Managed Care - HMO

## 2015-06-22 DIAGNOSIS — Z23 Encounter for immunization: Secondary | ICD-10-CM

## 2015-07-19 ENCOUNTER — Other Ambulatory Visit: Payer: Self-pay

## 2015-07-19 MED ORDER — LORAZEPAM 0.5 MG PO TABS: 0.5000 mg | ORAL_TABLET | Freq: Two times a day (BID) | ORAL | Status: DC

## 2015-07-19 NOTE — Telephone Encounter (Signed)
Got a fax from [place] requesting a refill of this patient's Ativan.  Refill request was sent to Dr. Bobetta Lime for approval and submission.

## 2015-08-10 ENCOUNTER — Other Ambulatory Visit: Payer: Self-pay

## 2015-08-10 MED ORDER — ADVAIR DISKUS 250-50 MCG/DOSE IN AEPB: 1.0000 | INHALATION_SPRAY | Freq: Two times a day (BID) | RESPIRATORY_TRACT | Status: DC

## 2015-08-10 MED ORDER — SERTRALINE HCL 100 MG PO TABS: 100.0000 mg | ORAL_TABLET | Freq: Every day | ORAL | Status: DC

## 2015-08-10 MED ORDER — LOVASTATIN 40 MG PO TABS: 40.0000 mg | ORAL_TABLET | Freq: Every day | ORAL | Status: DC

## 2015-08-10 MED ORDER — OMEPRAZOLE 40 MG PO CPDR
40.0000 mg | DELAYED_RELEASE_CAPSULE | Freq: Every day | ORAL | Status: DC
Start: 2015-08-10 — End: 2015-08-12

## 2015-08-12 ENCOUNTER — Other Ambulatory Visit: Payer: Self-pay

## 2015-08-12 MED ORDER — SERTRALINE HCL 100 MG PO TABS: 100.0000 mg | ORAL_TABLET | Freq: Every day | ORAL | Status: DC

## 2015-08-12 MED ORDER — OMEPRAZOLE 40 MG PO CPDR: 40.0000 mg | DELAYED_RELEASE_CAPSULE | Freq: Every day | ORAL | Status: DC

## 2015-08-12 MED ORDER — LOVASTATIN 40 MG PO TABS: 40.0000 mg | ORAL_TABLET | Freq: Every day | ORAL | Status: DC

## 2015-08-12 MED ORDER — ADVAIR DISKUS 250-50 MCG/DOSE IN AEPB: 1.0000 | INHALATION_SPRAY | Freq: Two times a day (BID) | RESPIRATORY_TRACT | Status: DC

## 2015-08-24 ENCOUNTER — Other Ambulatory Visit: Payer: Self-pay

## 2015-08-24 MED ORDER — OMEPRAZOLE 40 MG PO CPDR: 40.0000 mg | DELAYED_RELEASE_CAPSULE | Freq: Every day | ORAL | Status: DC

## 2015-08-24 MED ORDER — ADVAIR DISKUS 250-50 MCG/DOSE IN AEPB: 1.0000 | INHALATION_SPRAY | Freq: Two times a day (BID) | RESPIRATORY_TRACT | Status: DC

## 2015-08-24 MED ORDER — LOVASTATIN 40 MG PO TABS: 40.0000 mg | ORAL_TABLET | Freq: Every day | ORAL | Status: DC

## 2015-08-24 MED ORDER — SERTRALINE HCL 100 MG PO TABS: 100.0000 mg | ORAL_TABLET | Freq: Every day | ORAL | Status: DC

## 2015-10-18 DIAGNOSIS — E042 Nontoxic multinodular goiter: Secondary | ICD-10-CM | POA: Diagnosis not present

## 2015-10-19 ENCOUNTER — Ambulatory Visit
Admission: RE | Admit: 2015-10-19 | Discharge: 2015-10-19 | Disposition: A | Discharge status: Home / Self Care | Payer: Commercial Managed Care - HMO | Source: Ambulatory Visit | Attending: Oncology | Admitting: Oncology

## 2015-10-19 ENCOUNTER — Encounter: Payer: Self-pay | Admitting: Oncology

## 2015-10-19 ENCOUNTER — Inpatient Hospital Stay: Payer: Commercial Managed Care - HMO | Attending: Oncology | Admitting: Oncology

## 2015-10-19 VITALS — BP 129/77 | HR 88 | Temp 97.2°F | Resp 18 | Wt 117.3 lb

## 2015-10-19 DIAGNOSIS — R911 Solitary pulmonary nodule: Secondary | ICD-10-CM | POA: Insufficient documentation

## 2015-10-19 DIAGNOSIS — R591 Generalized enlarged lymph nodes: Secondary | ICD-10-CM | POA: Diagnosis not present

## 2015-10-19 DIAGNOSIS — R0602 Shortness of breath: Secondary | ICD-10-CM | POA: Diagnosis not present

## 2015-10-19 DIAGNOSIS — F329 Major depressive disorder, single episode, unspecified: Secondary | ICD-10-CM | POA: Insufficient documentation

## 2015-10-19 DIAGNOSIS — R590 Localized enlarged lymph nodes: Secondary | ICD-10-CM | POA: Insufficient documentation

## 2015-10-19 DIAGNOSIS — E042 Nontoxic multinodular goiter: Secondary | ICD-10-CM | POA: Diagnosis not present

## 2015-10-19 DIAGNOSIS — F17210 Nicotine dependence, cigarettes, uncomplicated: Secondary | ICD-10-CM | POA: Insufficient documentation

## 2015-10-19 DIAGNOSIS — I2510 Atherosclerotic heart disease of native coronary artery without angina pectoris: Secondary | ICD-10-CM | POA: Diagnosis not present

## 2015-10-19 DIAGNOSIS — N189 Chronic kidney disease, unspecified: Secondary | ICD-10-CM | POA: Insufficient documentation

## 2015-10-19 DIAGNOSIS — K219 Gastro-esophageal reflux disease without esophagitis: Secondary | ICD-10-CM | POA: Diagnosis not present

## 2015-10-19 DIAGNOSIS — Z79899 Other long term (current) drug therapy: Secondary | ICD-10-CM | POA: Insufficient documentation

## 2015-10-19 DIAGNOSIS — E785 Hyperlipidemia, unspecified: Secondary | ICD-10-CM | POA: Insufficient documentation

## 2015-10-19 DIAGNOSIS — E0789 Other specified disorders of thyroid: Secondary | ICD-10-CM

## 2015-10-19 LAB — POCT I-STAT CREATININE: Creatinine, Ser: 0.9 mg/dL (ref 0.44–1.00)

## 2015-10-19 MED ORDER — IOPAMIDOL (ISOVUE-300) INJECTION 61%
75.0000 mL | Freq: Once | INTRAVENOUS | Status: AC | PRN
  Administered 2015-10-19: 75 mL via INTRAVENOUS

## 2015-10-19 NOTE — Progress Notes (Signed)
Patient here today for CT results. 

## 2015-10-19 NOTE — Progress Notes (Signed)
Robin Jones @ [facility] Telephone:(336) [phone]  Fax:(336) [place] OB: 1950-09-14  MR#: [id number]  [id number]  Patient Care Team: Bobetta Lime, MD as PCP - General Nestor Lewandowsky, MD as Referring Physician (Cardiothoracic Surgery)  CHIEF COMPLAINT:  Chief Complaint  Patient presents with  . Lung Cancer  1.  Patient had abnormal screening CT scan followed by abnormal PET scan with right upper lobe lung nodule and the right hilar and right paratracheal lymph node which were FDG avid  Patient underwent navigational bronchoscopy and multiple biopsies and cytology report when negative for any malignant cells (September, 2016) 3.  Patient had thyroid nodule which is being followed by ENT surgeon very closely with 2 negative biopsies  VISIT DIAGNOSIS:   No diagnosis found.    No history exists.    Oncology Flowsheet 12/22/2014 12/22/2014 04/15/2015 06/15/2015  dexamethasone (DECADRON) IJ - - - -  dexamethasone (DECADRON) IV - - - -  ondansetron (ZOFRAN) IV 4 mg - - -    INTERVAL HISTORY: 65 year old lady was a chronic smoker trying to quit smoking.  No cough no hemoptysis no chest pain no weight loss.  Had a screening CT scan which was abnormal followed by a PET scan which was abnormal with right upper lobe nodule and right hilar lymphadenopathy which where hypermetabolic on a PET scan Patient underwent navigational bronchoscopy without any diagnosis.  Patient is here for further follow-up.   Patient had a repeat CT scan here to discuss the results and further planning of treatment  Continues to smoke.   Denies any chest pain.  Denies any hemoptysis.   Shortness of breath on exertion.   REVIEW OF SYSTEMS:   GENERAL:  Feels good.  Active.  No fevers, sweats or weight loss. PERFORMANCE STATUS (ECOG): 01 HEENT:  No visual changes, runny nose, sore throat, mouth sores or tenderness. Lungs: No shortness of breath or cough.  No hemoptysis. Cardiac:  No  chest pain, palpitations, orthopnea, or PND. GI:  No nausea, vomiting, diarrhea, constipation, melena or hematochezia. GU:  No urgency, frequency, dysuria, or hematuria. Musculoskeletal:  No back pain.  No joint pain.  No muscle tenderness. Extremities:  No pain or swelling. Skin:  No rashes or skin changes. Neuro:  No headache, numbness or weakness, balance or coordination issues. Endocrine:  No diabetes, thyroid issues, hot flashes or night sweats. Psych:  No mood changes, depression or anxiety. Pain:  No focal pain. Review of systems:  All other systems reviewed and found to be negative.  As per HPI. Otherwise, a complete review of systems is negatve.  PAST MEDICAL HISTORY: Past Medical History  Diagnosis Date  . Depression   . GERD (gastroesophageal reflux disease)   . Chronic kidney disease   . Constipation   . Hyperlipidemia   . Personal history of tobacco use, presenting hazards to health 03/23/2015  . Hearing loss of both ears   . Skin cancer of face 2013    Left side of face resected.     PAST SURGICAL HISTORY: Past Surgical History  Procedure Laterality Date  . Abdominal hysterectomy  1980    partial  . Back surgery  1978  . Tonsillectomy  13  . Colonoscopy N/A 12/22/2014    Procedure: COLONOSCOPY;  Surgeon: Lucilla Lame, MD;  Location: [facility] ENDOSCOPY;  Service: Endoscopy;  Laterality: N/A;  . Inner ear surgery Left 2013  . Electromagnetic navigation brochoscopy N/A 04/15/2015    Procedure: ELECTROMAGNETIC NAVIGATION BRONCHOSCOPY;  Surgeon: Flora Lipps, MD;  Location: [facility] ORS;  Service: Cardiopulmonary;  Laterality: N/A;  . Endobronchial ultrasound N/A 04/15/2015    Procedure: ENDOBRONCHIAL ULTRASOUND;  Surgeon: Flora Lipps, MD;  Location: [facility] ORS;  Service: Cardiopulmonary;  Laterality: N/A;  . Endobronchial ultrasound N/A 06/15/2015    Procedure: ENDOBRONCHIAL ULTRASOUND;  Surgeon: Flora Lipps, MD;  Location: [facility] ORS;  Service: Cardiopulmonary;  Laterality: N/A;     FAMILY HISTORY Family History  Problem Relation Age of Onset  . Heart disease Mother   . Thyroid disease Father   . Hepatitis C Sister   . Thyroid disease Sister   . Cancer Brother 55    in muscle of leg  . Heart disease Brother   . Liver cancer Sister 53  . COPD Sister   . Diabetes Maternal Grandmother          ADVANCED DIRECTIVES:   Patient does not have any living will or healthcare power of attorney.  Information was given .  Available resources had been discussed.  We will follow-up on subsequent appointments regarding this issue HEALTH MAINTENANCE: Social History  Substance Use Topics  . Smoking status: Current Every Day Smoker -- 1.00 packs/day for 45 years    Types: Cigarettes  . Smokeless tobacco: Never Used  . Alcohol Use: No      No Known Allergies  Current Outpatient Prescriptions  Medication Sig Dispense Refill  . acetaminophen (TYLENOL) 325 MG tablet Take 650 mg by mouth every 6 (six) hours as needed for moderate pain or headache.    . ADVAIR DISKUS 250-50 MCG/DOSE AEPB Inhale 1 puff into the lungs 2 (two) times daily. For shortness of breath 180 each 1  . azithromycin (ZITHROMAX) 500 MG tablet Take 1 tablet (500 mg total) by mouth daily. 7 tablet 0  . docusate sodium (COLACE) 100 MG capsule Take 100 mg by mouth every other day.     . fexofenadine (ALLEGRA) 180 MG tablet Take 180 mg by mouth daily.    . fluticasone (FLONASE) 50 MCG/ACT nasal spray Place 2 sprays into both nostrils as needed.     Marland Kitchen LORazepam (ATIVAN) 0.5 MG tablet Take 1 tablet (0.5 mg total) by mouth 2 (two) times daily. 60 tablet 5  . lovastatin (MEVACOR) 40 MG tablet Take 1 tablet (40 mg total) by mouth at bedtime. 90 tablet 1  . omeprazole (PRILOSEC) 40 MG capsule Take 1 capsule (40 mg total) by mouth at bedtime. 90 capsule 1  . predniSONE (DELTASONE) 20 MG tablet Take 1 tablet (20 mg total) by mouth 2 (two) times daily with a meal. 6 tablet 0  . PROAIR HFA 108 (90 BASE) MCG/ACT  inhaler Inhale 2 puffs into the lungs every 6 (six) hours as needed for wheezing or shortness of breath.   0  . sertraline (ZOLOFT) 100 MG tablet Take 1 tablet (100 mg total) by mouth at bedtime. 90 tablet 1   No current facility-administered medications for this visit.    OBJECTIVE: PHYSICAL EXAM: GENERAL:  Well developed, well nourished, sitting comfortably in the exam room in no acute distress. MENTAL STATUS:  Alert and oriented to person, place and time. . ENT:  Oropharynx clear without lesion.  Tongue normal. Mucous membranes moist.  RESPIRATORY:  Clear to auscultation without rales, wheezes or rhonchi. CARDIOVASCULAR:  Regular rate and rhythm without murmur, rub or gallop. . ABDOMEN:  Soft, non-tender, with active bowel sounds, and no hepatosplenomegaly.  No masses. BACK:  No CVA tenderness.  No tenderness  on percussion of the back or rib cage. SKIN:  No rashes, ulcers or lesions. EXTREMITIES: No edema, no skin discoloration or tenderness.  No palpable cords. LYMPH NODES: No palpable cervical, supraclavicular, axillary or inguinal adenopathy  NEUROLOGICAL: Unremarkable. PSYCH:  Appropriate.  Filed Vitals:   10/19/15 1514  BP: 129/77  Pulse: 88  Temp: 97.2 F (36.2 C)  Resp: 18     Body mass index is 20.12 kg/(m^2).    ECOG FS:1 - Symptomatic but completely ambulatory  LAB RESULTS:  Hospital Outpatient Visit on 10/19/2015  Component Date Value Ref Range Status  . Creatinine, Ser 10/19/2015 0.90  0.44 - 1.00 mg/dL Final     STUDIES: Ct Chest W Contrast  10/19/2015  CLINICAL DATA:  Hypermetabolic right lung lesion. EXAM: CT CHEST WITH CONTRAST TECHNIQUE: Multidetector CT imaging of the chest was performed during intravenous contrast administration. CONTRAST:  70mL ISOVUE-300 IOPAMIDOL (ISOVUE-300) INJECTION 61% COMPARISON:  PET-CT 04/09/2015.  CT chest 03/26/2015. FINDINGS: Mediastinum / Lymph Nodes: Bilateral small thyroid nodules are unchanged. There is no axillary  lymphadenopathy. 11 mm short axis right paratracheal lymph node measured previously, is now 8 mm short axis (image 19 series 2). Calcified nodal tissue is seen in the right hilum and subcarinal station. Calcified lymph nodes are also visible in the left hilum. The esophagus has normal imaging features. The heart size is normal. No pericardial effusion. Coronary artery calcification is noted. Lungs / Pleura: 17 mm spiculated right apical nodule has decreased in size to 15 mm on the current study. There is left apical pleural-parenchymal scarring. Changes of emphysema noted bilaterally. Tiny calcified granuloma noted right lower lobe. 3 mm posterior right lower lobe nodule (image 44 series 3) is unchanged. Stable 3 mm posterior left lower lobe nodule image 43 series 3. No focal airspace consolidation. No pulmonary edema or pleural effusion. Upper Abdomen:  Unremarkable. MSK / Soft Tissues: Bone windows reveal no worrisome lytic or sclerotic osseous lesions. IMPRESSION: 1. Slight interval decrease in size of the spiculated right apical nodule, now measuring 15 mm. 2. 11 mm short axis right paratracheal lymph node which was hypermetabolic on previous PET-CT has decreased 8 mm. Calcified nodal tissue is seen in the right hilum compatible with old granulomatous disease, but no discrete enlarged right hilar lymph node is evident. 3. Bilateral thyroid nodules, 1 of which was seen to be hypermetabolic on previous PET imaging. Electronically Signed   By: Misty Stanley M.D.   On: 10/19/2015 10:22    ASSESSMENT:  Abnormal PET scan with right apical lung nodule and hilar adenopathy which are PET positive ENB is negative Patient is chronic smoker and continues to smoke  PLAN:   I independently reviewed PET scan and CT scan. Endoscopy, ultrasound and needle biopsy of lymph node has been reported to be negative (June 15, 2015) Repeat PET scan of October 19, 2015 has been reviewed independently Right upper lobe nodule  persisted and there is hardly any change in the size of the nodule  Patient continues to smoke Nodule does have a spiculated appearance and was PET positive Possibility of slow-growing primary bronchogenic carcinoma cannot be ruled out I would like to get a thoracic surgeon to look at this CT scan and evaluate patient one more time . I   Will discuss that with Dr. Faith Rogue. CT scan has been reviewed independently and compared with the previous CT scan of last May of 2016 as well as PET scan in between. She is agreeable to see thoracic  surgeon at present time I will make next appointment after discussing case with  Patient expressed understanding and was in agreement with this plan. She also understands that She can call clinic at any time with any questions, concerns, or complaints.    No matching staging information was found for the patient.  Robin Gleason, MD   10/19/2015 3:23 PM

## 2015-10-21 ENCOUNTER — Ambulatory Visit: Payer: Commercial Managed Care - HMO | Admitting: Oncology

## 2015-10-22 ENCOUNTER — Telehealth: Payer: Self-pay | Admitting: Family Medicine

## 2015-10-22 NOTE — Telephone Encounter (Signed)
Robin Jones from the [facility] states that patient has referral appointment with Dr Oliva Bustard, however, she has appointment with thoracic doctor Robin Jones ON 10-28-15 @ 3:30P. Diagnostic code used is R91.1

## 2015-10-22 NOTE — Telephone Encounter (Signed)
[employer] Auth obtained ICD-10: R91.1 Auth # [id number] Start - 10/22/15 Expires - 04/19/16 Dr. Nestor Lewandowsky

## 2015-10-28 ENCOUNTER — Inpatient Hospital Stay (HOSPITAL_BASED_OUTPATIENT_CLINIC_OR_DEPARTMENT_OTHER): Payer: Commercial Managed Care - HMO | Admitting: Cardiothoracic Surgery

## 2015-10-28 VITALS — BP 124/77 | HR 83 | Temp 97.0°F | Wt 116.4 lb

## 2015-10-28 DIAGNOSIS — R590 Localized enlarged lymph nodes: Secondary | ICD-10-CM | POA: Diagnosis not present

## 2015-10-28 DIAGNOSIS — E785 Hyperlipidemia, unspecified: Secondary | ICD-10-CM | POA: Diagnosis not present

## 2015-10-28 DIAGNOSIS — Z79899 Other long term (current) drug therapy: Secondary | ICD-10-CM | POA: Diagnosis not present

## 2015-10-28 DIAGNOSIS — R911 Solitary pulmonary nodule: Secondary | ICD-10-CM

## 2015-10-28 DIAGNOSIS — K219 Gastro-esophageal reflux disease without esophagitis: Secondary | ICD-10-CM | POA: Diagnosis not present

## 2015-10-28 DIAGNOSIS — F17210 Nicotine dependence, cigarettes, uncomplicated: Secondary | ICD-10-CM | POA: Diagnosis not present

## 2015-10-28 DIAGNOSIS — I2510 Atherosclerotic heart disease of native coronary artery without angina pectoris: Secondary | ICD-10-CM | POA: Diagnosis not present

## 2015-10-28 DIAGNOSIS — F329 Major depressive disorder, single episode, unspecified: Secondary | ICD-10-CM | POA: Diagnosis not present

## 2015-10-28 DIAGNOSIS — R0602 Shortness of breath: Secondary | ICD-10-CM | POA: Diagnosis not present

## 2015-10-28 NOTE — Progress Notes (Signed)
[facility] Post-[facility] Note  Patient ID: Robin Jones, female   DOB: [birth year], 65 y.o.   MRN: [id number]  HISTORY: This patient returns today in follow-up. She had a right upper lobe mass with paratracheal adenopathy. This was intensely positive on PET scanning however bronchoscopy and endobronchial ultrasound biopsy was negative. Patient does have evidence of a prior history of granulomatous disease. She continues to smoke. She did have a recent CT scan which showed a slight decrease in the size of the right upper lobe mass and right mediastinal adenopathy. She denied any recent fevers or chills. He states that she is able to get along quite well without any significant shortness of breath. She did have some pulmonary function studies done which revealed an FEV1 and a DLCO both in the 90s.   Filed Vitals:   10/28/15 1520  BP: 124/77  Pulse: 83  Temp: 97 F (36.1 C)     EXAM: Resp: Lungs are clear bilaterally.  No respiratory distress, normal effort. Heart:  Regular without murmurs Abd:  Abdomen is soft, non distended and non tender. No masses are palpable.  There is no rebound and no guarding.  Neurological: Alert and oriented to person, place, and time. Coordination normal.  Skin: Skin is warm and dry. No rash noted. No diaphoretic. No erythema. No pallor.  Psychiatric: Normal mood and affect. Normal behavior. Judgment and thought content normal.    ASSESSMENT: I believe that the lesion in the right upper lobe is most likely related to prior granulomatous disease. I'm encouraged that the lesion is slightly smaller. However I did offer her a right thoracoscopy with wedge resection of this lesion with frozen section and subsequent lobectomy if needed. An alternative would be to continue to follow this.   PLAN:   Because the lesion is slightly smaller the patient does not desire surgical intervention. She wish to pursue a repeat CT scan. This will be arranged in 6  months.    Nestor Lewandowsky, MD

## 2015-11-08 ENCOUNTER — Ambulatory Visit: Payer: Medicare Other

## 2015-11-11 ENCOUNTER — Encounter: Payer: Self-pay | Admitting: Family Medicine

## 2015-11-11 ENCOUNTER — Ambulatory Visit (INDEPENDENT_AMBULATORY_CARE_PROVIDER_SITE_OTHER): Payer: Commercial Managed Care - HMO | Admitting: Family Medicine

## 2015-11-11 VITALS — BP 140/78 | HR 85 | Temp 98.5°F | Resp 16 | Wt 117.0 lb

## 2015-11-11 DIAGNOSIS — R911 Solitary pulmonary nodule: Secondary | ICD-10-CM | POA: Diagnosis not present

## 2015-11-11 DIAGNOSIS — G4700 Insomnia, unspecified: Secondary | ICD-10-CM

## 2015-11-11 DIAGNOSIS — M159 Polyosteoarthritis, unspecified: Secondary | ICD-10-CM

## 2015-11-11 DIAGNOSIS — E785 Hyperlipidemia, unspecified: Secondary | ICD-10-CM

## 2015-11-11 DIAGNOSIS — F3341 Major depressive disorder, recurrent, in partial remission: Secondary | ICD-10-CM | POA: Diagnosis not present

## 2015-11-11 DIAGNOSIS — D497 Neoplasm of unspecified behavior of endocrine glands and other parts of nervous system: Secondary | ICD-10-CM | POA: Diagnosis not present

## 2015-11-11 DIAGNOSIS — Z5181 Encounter for therapeutic drug level monitoring: Secondary | ICD-10-CM | POA: Diagnosis not present

## 2015-11-11 DIAGNOSIS — R748 Abnormal levels of other serum enzymes: Secondary | ICD-10-CM | POA: Diagnosis not present

## 2015-11-11 DIAGNOSIS — E0789 Other specified disorders of thyroid: Secondary | ICD-10-CM

## 2015-11-11 DIAGNOSIS — K219 Gastro-esophageal reflux disease without esophagitis: Secondary | ICD-10-CM

## 2015-11-11 DIAGNOSIS — M150 Primary generalized (osteo)arthritis: Secondary | ICD-10-CM

## 2015-11-11 DIAGNOSIS — J449 Chronic obstructive pulmonary disease, unspecified: Secondary | ICD-10-CM | POA: Diagnosis not present

## 2015-11-11 MED ORDER — RANITIDINE HCL 300 MG PO TABS: 300.0000 mg | ORAL_TABLET | Freq: Every day | ORAL | Status: DC

## 2015-11-11 MED ORDER — OMEPRAZOLE 40 MG PO CPDR: DELAYED_RELEASE_CAPSULE | ORAL | Status: DC

## 2015-11-11 MED ORDER — LORAZEPAM 0.5 MG PO TABS: ORAL_TABLET | ORAL | Status: DC

## 2015-11-11 MED ORDER — SERTRALINE HCL 100 MG PO TABS: 150.0000 mg | ORAL_TABLET | Freq: Every day | ORAL | Status: DC

## 2015-11-11 NOTE — Assessment & Plan Note (Signed)
Continue advair and SABA

## 2015-11-11 NOTE — Assessment & Plan Note (Signed)
Tolerating statin; check fasting lipids; avoid saturated fats

## 2015-11-11 NOTE — Assessment & Plan Note (Addendum)
Risks of prolonged PPI use discussed with patient, including osteoporosis, anemia; will have her wean off PPI; avoid triggers; start H2 blocker

## 2015-11-11 NOTE — Assessment & Plan Note (Signed)
Try turmeric and tylenol 

## 2015-11-11 NOTE — Progress Notes (Signed)
BP 140/78 mmHg  Pulse 85  Temp(Src) 98.5 F (36.9 C) (Oral)  Resp 16  Wt 117 lb (53.071 kg)  SpO2 97%   Subjective:    Patient ID: Robin Jones, female    DOB: 1950-10-15, 65 y.o.   MRN: [id number]  HPI: Robin Jones is a 65 y.o. female  Chief Complaint  Patient presents with  . Medication Refill  . Anxiety    currently on lorazepam, states it's not helping her sleep.  Marland Kitchen Hyperlipidemia  . Gastroesophageal Reflux  . COPD    see's pulmonologist   Patient is new to me today; her previous provider left the practice  Chronic insomnia She isn't sleeping well at night She doesn't think that her medicine is strong enough She lost her sister and father in 2010, same year She does not see a counselor Trouble sleeping; plays games; turns TV off at 11 pm; might be up until 3 am  COPD; using advair and proair; does NOT see pulmonologist; not using proair very often  Smokes 1/2 ppd; she used to smoke 1 ppd; keeps trying to quit  High cholesterol; not much bacon or sausage; not real hungry  GERD; no hx of ulcers; on PPI; heartburn not bad  Back pain; surgery in 1978; still has back pain; 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 Allergies and medications reviewed and updated.  Review of Systems Per HPI unless specifically indicated above     Objective:    BP 140/78 mmHg  Pulse 85  Temp(Src) 98.5 F (36.9 C) (Oral)  Resp 16  Wt 117 lb (53.071 kg)  SpO2 97%  Wt Readings from Last 3 Encounters:  11/11/15 117 lb (53.071 kg)  10/28/15 116 lb 6.5 oz (52.8 kg)  10/19/15 117 lb 4.6 oz (53.2 kg)    Physical Exam  Constitutional: She appears well-developed and well-nourished. No distress.  Thin, elderly; appears older than stated age  HENT:  Head:  Normocephalic and atraumatic.  Eyes: EOM are normal. No scleral icterus.  Neck: No thyromegaly present.  Cardiovascular: Normal rate, regular rhythm and normal heart sounds.   No murmur heard. Pulmonary/Chest: Effort normal and breath sounds normal. No respiratory distress. She has no wheezes.  Abdominal: Soft. She exhibits no distension.  Musculoskeletal: Normal range of motion. She exhibits no edema.  Neurological: She is alert. She exhibits normal muscle tone.  Skin: Skin is warm and dry. She is not diaphoretic. No pallor.  Psychiatric: She has a normal mood and affect. Her behavior is normal. Judgment and thought content normal.      Assessment & Plan:   Problem List Items Addressed This Visit      Respiratory   COPD, mild ([place]) - Primary    Continue advair and SABA        Digestive   GERD without esophagitis    Risks of prolonged PPI use discussed with patient, including osteoporosis, anemia; will have her wean off PPI; avoid triggers; start H2 blocker      Relevant Medications   omeprazole (PRILOSEC) 40 MG capsule   ranitidine (ZANTAC) 300 MG tablet     Musculoskeletal and Integument   Osteoarthritis of multiple joints    Try turmeric and tylenol        Other   Depression,  major, recurrent, in partial remission ([place])    Increase SSRI; encouraged her to start working with grief counselor, as it sounds as though the deaths from 2010 are significantly affecting her; close f/u      Relevant Medications   LORazepam (ATIVAN) 0.5 MG tablet   sertraline (ZOLOFT) 100 MG tablet   Thyroid mass of unclear etiology    Dr. Richardson Landry (ENT) follows that      Hyperlipidemia LDL goal <100    Tolerating statin; check fasting lipids; avoid saturated fats      Relevant Orders   Lipid Panel w/o Chol/HDL Ratio (Completed)   Lung nodule seen on imaging study    Followed by Dr. Genevive Bi      Medication monitoring encounter   Relevant Orders   Comprehensive metabolic panel  (Completed)   Insomnia    suggsted melatonin 3 mg for 3 weeks at exact same time of night; good sleep hygiene encouraged         Follow up plan: Return in about 4 weeks (around 12/09/2015) for medication follow-up.  An after-visit summary was printed and given to the patient at [facility].  Please see the patient instructions which may contain other information and recommendations beyond what is mentioned above in the assessment and plan.

## 2015-11-11 NOTE — Assessment & Plan Note (Signed)
Dr. Richardson Landry (ENT) follows that

## 2015-11-11 NOTE — Assessment & Plan Note (Signed)
Followed by Dr. Oaks 

## 2015-11-11 NOTE — Patient Instructions (Addendum)
Please do consider calling Hospice Address: [address], [place], [place] [address]  Phone: [phone] They can help you with grief counseling Increase your sertraline from 100 mg daily to 150 mg daily Return to see me in 4 weeks, but call sooner or seek medical help for any dark thoughts of problems  Try turmeric as a natural anti-inflammatory (for pain and arthritis). It comes in capsules where you buy aspirin and fish oil, but also as a spice where you buy pepper and garlic powder.  Start melatonin 3 mg at the same time of night every night for 3 weeks Try to fall asleep at the same time every night  I do encourage you to quit smoking Call [phone] to sign up for smoking cessation classes You can call 1-800-QUIT-NOW to talk with a smoking cessation coach  Let's decrease your lorazepam as follows: Decrease to just HALF of a pill once a day for 3 weeks, then just stop it   Steps to Quit Smoking  Smoking tobacco can be harmful to your health and can affect almost every organ in your body. Smoking puts you, and those around you, at risk for developing many serious chronic diseases. Quitting smoking is difficult, but it is one of the best things that you can do for your health. It is never too late to quit. WHAT ARE THE BENEFITS OF QUITTING SMOKING? When you quit smoking, you lower your risk of developing serious diseases and conditions, such as:  Lung cancer or lung disease, such as COPD.  Heart disease.  Stroke.  Heart attack.  Infertility.  Osteoporosis and bone fractures. Additionally, symptoms such as coughing, wheezing, and shortness of breath may get better when you quit. You may also find that you get sick less often because your body is stronger at fighting off colds and infections. If you are pregnant, quitting smoking can help to reduce your chances of having a baby of low birth weight. HOW DO I GET READY TO QUIT? When you decide to quit smoking, create a plan to  make sure that you are successful. Before you quit:  Pick a date to quit. Set a date within the next two weeks to give you time to prepare.  Write down the reasons why you are quitting. Keep this list in places where you will see it often, such as on your bathroom mirror or in your car or wallet.  Identify the people, places, things, and activities that make you want to smoke (triggers) and avoid them. Make sure to take these actions:  Throw away all cigarettes at home, at work, and in your car.  Throw away smoking accessories, such as Scientist, research (medical).  Clean your car and make sure to empty the ashtray.  Clean your home, including curtains and carpets.  Tell your family, friends, and coworkers that you are quitting. Support from your loved ones can make quitting easier.  Talk with your health care provider about your options for quitting smoking.  Find out what treatment options are covered by your health insurance. WHAT STRATEGIES CAN I USE TO QUIT SMOKING?  Talk with your healthcare provider about different strategies to quit smoking. Some strategies include:  Quitting smoking altogether instead of gradually lessening how much you smoke over a period of time. Research shows that quitting "cold [place]" is more successful than gradually quitting.  Attending in-person counseling to help you build problem-solving skills. You are more likely to have success in quitting if you attend  several counseling sessions. Even short sessions of 10 minutes can be effective.  Finding resources and support systems that can help you to quit smoking and remain smoke-free after you quit. These resources are most helpful when you use them often. They can include:  Online chats with a Social worker.  Telephone quitlines.  Printed Furniture conservator/restorer.  Support groups or group counseling.  Text messaging programs.  Mobile phone applications.  Taking medicines to help you quit smoking. (If you are  pregnant or breastfeeding, talk with your health care provider first.) Some medicines contain nicotine and some do not. Both types of medicines help with cravings, but the medicines that include nicotine help to relieve withdrawal symptoms. Your health care provider may recommend:  Nicotine patches, gum, or lozenges.  Nicotine inhalers or sprays.  Non-nicotine medicine that is taken by mouth. Talk with your health care provider about combining strategies, such as taking medicines while you are also receiving in-person counseling. Using these two strategies together makes you more likely to succeed in quitting than if you used either strategy on its own. If you are pregnant or breastfeeding, talk with your health care provider about finding counseling or other support strategies to quit smoking. Do not take medicine to help you quit smoking unless told to do so by your health care provider. WHAT THINGS CAN I DO TO MAKE IT EASIER TO QUIT? Quitting smoking might feel overwhelming at first, but there is a lot that you can do to make it easier. Take these important actions:  Reach out to your family and friends and ask that they support and encourage you during this time. Call telephone quitlines, reach out to support groups, or work with a counselor for support.  Ask people who smoke to avoid smoking around you.  Avoid places that trigger you to smoke, such as bars, parties, or smoke-break areas at work.  Spend time around people who do not smoke.  Lessen stress in your life, because stress can be a smoking trigger for some people. To lessen stress, try:  Exercising regularly.  Deep-breathing exercises.  Yoga.  Meditating.  Performing a body scan. This involves closing your eyes, scanning your body from head to toe, and noticing which parts of your body are particularly tense. Purposefully relax the muscles in those areas.  Download or purchase mobile phone or tablet apps (applications)  that can help you stick to your quit plan by providing reminders, tips, and encouragement. There are many free apps, such as QuitGuide from the [employer] Office manager for Disease Control and Prevention). You can find other support for quitting smoking (smoking cessation) through smokefree.gov and other websites. HOW WILL I FEEL WHEN I QUIT SMOKING? Within the first 24 hours of quitting smoking, you may start to feel some withdrawal symptoms. These symptoms are usually most noticeable 2-3 days after quitting, but they usually do not last beyond 2-3 weeks. Changes or symptoms that you might experience include:  Mood swings.  Restlessness, anxiety, or irritation.  Difficulty concentrating.  Dizziness.  Strong cravings for sugary foods in addition to nicotine.  Mild weight gain.  Constipation.  Nausea.  Coughing or a sore throat.  Changes in how your medicines work in your body.  A depressed mood.  Difficulty sleeping (insomnia). After the first 2-3 weeks of quitting, you may start to notice more positive results, such as:  Improved sense of smell and taste.  Decreased coughing and sore throat.  Slower heart rate.  Lower blood pressure.  Clearer  skin.  The ability to breathe more easily.  Fewer sick days. Quitting smoking is very challenging for most people. Do not get discouraged if you are not successful the first time. Some people need to make many attempts to quit before they achieve long-term success. Do your best to stick to your quit plan, and talk with your health care provider if you have any questions or concerns.   This information is not intended to replace advice given to you by your health care provider. Make sure you discuss any questions you have with your health care provider.   Document Released: 07/18/2001 Document Revised: 12/08/2014 Document Reviewed: 12/08/2014 Elsevier Interactive Patient Education [employer].

## 2015-11-12 ENCOUNTER — Ambulatory Visit
Admission: RE | Admit: 2015-11-12 | Discharge: 2015-11-12 | Disposition: A | Discharge status: Home / Self Care | Payer: Commercial Managed Care - HMO | Source: Ambulatory Visit | Attending: Otolaryngology | Admitting: Otolaryngology

## 2015-11-12 ENCOUNTER — Other Ambulatory Visit: Payer: Self-pay | Admitting: Family Medicine

## 2015-11-12 DIAGNOSIS — E042 Nontoxic multinodular goiter: Secondary | ICD-10-CM | POA: Diagnosis not present

## 2015-11-12 DIAGNOSIS — E041 Nontoxic single thyroid nodule: Secondary | ICD-10-CM

## 2015-11-12 DIAGNOSIS — R748 Abnormal levels of other serum enzymes: Secondary | ICD-10-CM | POA: Insufficient documentation

## 2015-11-12 LAB — LIPID PANEL W/O CHOL/HDL RATIO
CHOLESTEROL TOTAL: 167 mg/dL (ref 100–199)
HDL: 52 mg/dL (ref >39)
LDL CALC: 85 mg/dL (ref 0–99)
Triglycerides: 148 mg/dL (ref 0–149)
VLDL CHOLESTEROL CAL: 30 mg/dL (ref 5–40)

## 2015-11-12 LAB — COMPREHENSIVE METABOLIC PANEL
ALBUMIN: 4.5 g/dL (ref 3.6–4.8)
ALK PHOS: 132 IU/L — AB (ref 39–117)
ALT: 5 IU/L (ref 0–32)
AST: 11 IU/L (ref 0–40)
Albumin/Globulin Ratio: 2 (ref 1.2–2.2)
BILIRUBIN TOTAL: 0.4 mg/dL (ref 0.0–1.2)
BUN / CREAT RATIO: 11 — AB (ref 12–28)
BUN: 9 mg/dL (ref 8–27)
CALCIUM: 9.5 mg/dL (ref 8.7–10.3)
CHLORIDE: 99 mmol/L (ref 96–106)
CO2: 26 mmol/L (ref 18–29)
Creatinine, Ser: 0.83 mg/dL (ref 0.57–1.00)
GFR calc Af Amer: 84 mL/min/{1.73_m2} (ref >59)
GFR calc non Af Amer: 73 mL/min/{1.73_m2} (ref >59)
Globulin, Total: 2.2 g/dL (ref 1.5–4.5)
Glucose: 87 mg/dL (ref 65–99)
Potassium: 3.7 mmol/L (ref 3.5–5.2)
SODIUM: 142 mmol/L (ref 134–144)
TOTAL PROTEIN: 6.7 g/dL (ref 6.0–8.5)

## 2015-11-16 ENCOUNTER — Telehealth: Payer: Self-pay | Admitting: Family Medicine

## 2015-11-16 DIAGNOSIS — Z13820 Encounter for screening for osteoporosis: Secondary | ICD-10-CM

## 2015-11-16 DIAGNOSIS — R748 Abnormal levels of other serum enzymes: Secondary | ICD-10-CM

## 2015-11-16 LAB — ALKALINE PHOSPHATASE, ISOENZYMES
Alkaline Phosphatase: 129 IU/L — ABNORMAL HIGH (ref 39–117)
BONE FRACTION: 60 % (ref 14–68)
INTESTINAL FRAC.: 0 % (ref 0–18)
LIVER FRACTION: 40 % (ref 18–85)

## 2015-11-16 LAB — SPECIMEN STATUS REPORT

## 2015-11-16 NOTE — Telephone Encounter (Signed)
I called about labs; left msg, will try another time

## 2015-11-17 MED ORDER — VITAMIN D (ERGOCALCIFEROL) 1.25 MG (50000 UNIT) PO CAPS: 50000.0000 [IU] | ORAL_CAPSULE | ORAL | Status: DC

## 2015-11-17 NOTE — Telephone Encounter (Signed)
I called pt, discussed lab results Will get DEXA scan; last order from July she never had done; ordering new one; patient to call and schedule Alk phos mildly elevated; will start vit D Rx once a week for one month, then 2000 iu daily F/u May and we'll recheck

## 2015-11-17 NOTE — Assessment & Plan Note (Signed)
Order dexa.  

## 2015-12-05 ENCOUNTER — Encounter: Payer: Self-pay | Admitting: Family Medicine

## 2015-12-05 DIAGNOSIS — G4700 Insomnia, unspecified: Secondary | ICD-10-CM

## 2015-12-05 HISTORY — DX: Insomnia, unspecified: G47.00

## 2015-12-05 NOTE — Assessment & Plan Note (Signed)
suggsted melatonin 3 mg for 3 weeks at exact same time of night; good sleep hygiene encouraged

## 2015-12-05 NOTE — Assessment & Plan Note (Signed)
Increase SSRI; encouraged her to start working with grief counselor, as it sounds as though the deaths from 2010 are significantly affecting her; close f/u

## 2015-12-08 ENCOUNTER — Ambulatory Visit
Admission: RE | Admit: 2015-12-08 | Discharge: 2015-12-08 | Disposition: A | Discharge status: Home / Self Care | Payer: Commercial Managed Care - HMO | Source: Ambulatory Visit | Attending: Family Medicine | Admitting: Family Medicine

## 2015-12-08 DIAGNOSIS — M8580 Other specified disorders of bone density and structure, unspecified site: Secondary | ICD-10-CM | POA: Diagnosis not present

## 2015-12-08 DIAGNOSIS — Z13820 Encounter for screening for osteoporosis: Secondary | ICD-10-CM | POA: Insufficient documentation

## 2015-12-08 DIAGNOSIS — M8588 Other specified disorders of bone density and structure, other site: Secondary | ICD-10-CM | POA: Diagnosis not present

## 2015-12-09 ENCOUNTER — Other Ambulatory Visit: Payer: Self-pay | Admitting: Family Medicine

## 2015-12-09 ENCOUNTER — Ambulatory Visit (INDEPENDENT_AMBULATORY_CARE_PROVIDER_SITE_OTHER): Payer: Commercial Managed Care - HMO | Admitting: Family Medicine

## 2015-12-09 ENCOUNTER — Encounter: Payer: Self-pay | Admitting: Family Medicine

## 2015-12-09 VITALS — BP 118/78 | HR 95 | Temp 98.2°F | Resp 16 | Wt 113.0 lb

## 2015-12-09 DIAGNOSIS — R911 Solitary pulmonary nodule: Secondary | ICD-10-CM | POA: Diagnosis not present

## 2015-12-09 DIAGNOSIS — D497 Neoplasm of unspecified behavior of endocrine glands and other parts of nervous system: Secondary | ICD-10-CM | POA: Diagnosis not present

## 2015-12-09 DIAGNOSIS — Z1159 Encounter for screening for other viral diseases: Secondary | ICD-10-CM | POA: Diagnosis not present

## 2015-12-09 DIAGNOSIS — M8580 Other specified disorders of bone density and structure, unspecified site: Secondary | ICD-10-CM | POA: Diagnosis not present

## 2015-12-09 DIAGNOSIS — K219 Gastro-esophageal reflux disease without esophagitis: Secondary | ICD-10-CM

## 2015-12-09 DIAGNOSIS — Z720 Tobacco use: Secondary | ICD-10-CM

## 2015-12-09 DIAGNOSIS — F3341 Major depressive disorder, recurrent, in partial remission: Secondary | ICD-10-CM

## 2015-12-09 DIAGNOSIS — N182 Chronic kidney disease, stage 2 (mild): Secondary | ICD-10-CM | POA: Diagnosis not present

## 2015-12-09 DIAGNOSIS — E0789 Other specified disorders of thyroid: Secondary | ICD-10-CM

## 2015-12-09 DIAGNOSIS — R748 Abnormal levels of other serum enzymes: Secondary | ICD-10-CM | POA: Diagnosis not present

## 2015-12-09 DIAGNOSIS — H9113 Presbycusis, bilateral: Secondary | ICD-10-CM

## 2015-12-09 DIAGNOSIS — E785 Hyperlipidemia, unspecified: Secondary | ICD-10-CM | POA: Diagnosis not present

## 2015-12-09 DIAGNOSIS — Z9181 History of falling: Secondary | ICD-10-CM | POA: Diagnosis not present

## 2015-12-09 HISTORY — DX: Other specified disorders of bone density and structure, unspecified site: M85.80

## 2015-12-09 MED ORDER — ALENDRONATE SODIUM 70 MG PO TABS: 70.0000 mg | ORAL_TABLET | ORAL | Status: DC

## 2015-12-09 MED ORDER — ALENDRONATE SODIUM 70 MG PO TABS
70.0000 mg | ORAL_TABLET | ORAL | Status: DC
Start: 2015-12-09 — End: 2016-04-14

## 2015-12-09 NOTE — Assessment & Plan Note (Signed)
thre servings of calcium daily; fall precuations; start fosamax; next bone density in two years

## 2015-12-09 NOTE — Assessment & Plan Note (Signed)
Recheck lab today, suspect bone turnover

## 2015-12-09 NOTE — Assessment & Plan Note (Signed)
Avoid NSAIDs; stay hydrated; try to quit smoking

## 2015-12-09 NOTE — Assessment & Plan Note (Addendum)
Fall precautions discussed. 

## 2015-12-09 NOTE — Progress Notes (Signed)
BP 118/78 mmHg  Pulse 95  Temp(Src) 98.2 F (36.8 C) (Oral)  Resp 16  Wt 113 lb (51.256 kg)  SpO2 97%   Subjective:    Patient ID: Robin Jones, female    DOB: Dec 12, 1950, 65 y.o.   MRN: [id number]  HPI: Robin Jones is a 65 y.o. female  Chief Complaint  Patient presents with  . Follow-up  . paperwork   Patient is here with her sister-in-law to go over [place] note that was received Depression d/o, major recurrent; doing well on SSRI She does not have dysthymic disorder; she does not have depressive d/o not elsewhere classified Hyperlipidemia; reviewed lipids COPD; worsened by pollen; SABA no more than 2x weekly Lung nodules; followed by Dr. Genevive Bi We reviewed her med list on the form, which was incorrect She has weaned off of lorazepam; she does not take omeprazole or meloxicam She had DEXA yesterday; osteopenia noted in hips; has never taken medicine; active smoker  The family hx is incorrect; she says there is no breast cancer or lung cancer in her family  Her ADL section neglected to mention that she is very hard of hearing and needs help, one of the big reasons her sister-in-law is here with her today in fact  Juluis Mire, NP billed a [phone] for the above SOAP note  Depression screen [facility] 2/9 12/09/2015 03/03/2015  Decreased Interest 0 0  Down, Depressed, Hopeless 1 0  PHQ - 2 Score 1 0   Relevant past medical, surgical, family and social history reviewed Past Medical History  Diagnosis Date  . Depression   . GERD (gastroesophageal reflux disease)   . Chronic kidney disease   . Constipation   . Hyperlipidemia   . Personal history of tobacco use, presenting hazards to health 03/23/2015  . Hearing loss of both ears   . Skin cancer of face 2013    Left side of face resected.   . Insomnia 12/05/2015  . Depression, major, recurrent, in partial remission ([place]) 03/03/2015  . CKD (chronic kidney disease) stage 2, GFR 60-89 ml/min 03/03/2015  . Osteopenia  12/09/2015   Past Surgical History  Procedure Laterality Date  . Abdominal hysterectomy  1980    partial  . Back surgery  1978  . Tonsillectomy  13  . Colonoscopy N/A 12/22/2014    Procedure: COLONOSCOPY;  Surgeon: Lucilla Lame, MD;  Location: [facility] ENDOSCOPY;  Service: Endoscopy;  Laterality: N/A;  . Inner ear surgery Left 2013  . Electromagnetic navigation brochoscopy N/A 04/15/2015    Procedure: ELECTROMAGNETIC NAVIGATION BRONCHOSCOPY;  Surgeon: Flora Lipps, MD;  Location: [facility] ORS;  Service: Cardiopulmonary;  Laterality: N/A;  . Endobronchial ultrasound N/A 04/15/2015    Procedure: ENDOBRONCHIAL ULTRASOUND;  Surgeon: Flora Lipps, MD;  Location: [facility] ORS;  Service: Cardiopulmonary;  Laterality: N/A;  . Endobronchial ultrasound N/A 06/15/2015    Procedure: ENDOBRONCHIAL ULTRASOUND;  Surgeon: Flora Lipps, MD;  Location: [facility] ORS;  Service: Cardiopulmonary;  Laterality: N/A;   Family History  Problem Relation Age of Onset  . Heart disease Mother   . Thyroid disease Father   . Hepatitis C Sister   . Thyroid disease Sister   . Cancer Brother 55    in muscle of leg  . Heart disease Brother   . Liver cancer Sister 54  . COPD Sister   . Diabetes Maternal Grandmother    Social History  Substance Use Topics  . Smoking status: Current Every Day Smoker -- 1.00 packs/day for  45 years    Types: Cigarettes  . Smokeless tobacco: Never Used  . Alcohol Use: No   Interim medical history since last visit reviewed. Allergies and medications reviewed  Review of Systems Per HPI unless specifically indicated above     Objective:    BP 118/78 mmHg  Pulse 95  Temp(Src) 98.2 F (36.8 C) (Oral)  Resp 16  Wt 113 lb (51.256 kg)  SpO2 97%  Wt Readings from Last 3 Encounters:  12/09/15 113 lb (51.256 kg)  11/11/15 117 lb (53.071 kg)  10/28/15 116 lb 6.5 oz (52.8 kg)   body mass index is 19.39 kg/(m^2).  Physical Exam  Constitutional: She appears well-developed.  Thin, appears older than stated  age  HENT:  Mouth/Throat: Mucous membranes are normal.  Very hard of hearing  Eyes: EOM are normal. No scleral icterus.  Cardiovascular: Normal rate and regular rhythm.   Pulmonary/Chest: Effort normal and breath sounds normal.  Psychiatric: She has a normal mood and affect. Her behavior is normal. Her mood appears not anxious. She does not exhibit a depressed mood.      Assessment & Plan:   Problem List Items Addressed This Visit      Digestive   GERD without esophagitis    Discussed risk of long-term PPI use including osteoporosis; will switch to H2 blocker; reasons to call reviewed; avoid triggers        Nervous and Auditory   Presbycusis of both ears    ENT Dr. Richardson Landry        Musculoskeletal and Integument   Osteopenia    thre servings of calcium daily; fall precuations; start fosamax; next bone density in two years        Genitourinary   CKD (chronic kidney disease) stage 2, GFR 60-89 ml/min - Primary    Avoid NSAIDs; stay hydrated; try to quit smoking        Other   Tobacco abuse    Encouraged cessation; smoking increases risk of bone loss, fractures, lung cancer, so many other health problems      Thyroid mass of unclear etiology    Solid nodule, less than 2 cm on [place] from April 2017, followed by ENT Dr. Richardson Landry      Need for hepatitis C screening test    Discussed one-time hep C screening recommendation for individuals born between 1945-1965 per USPSTF guidelines; patient agrees with testing; Hep C Ab ordered      Relevant Orders   Hepatitis C antibody   Lung nodule seen on imaging study    Followed by Dr. Nestor Lewandowsky, [place] surgeon; encouraged smoking cessation      Hyperlipidemia LDL goal <100    Encouraged healthier eating; limit saturated fats; continue statin      Depression, major, recurrent, in partial remission ([place])    Continue SSRI; patient reports doing well on medicine; she is off of benzo now (which can increase risk of falls and worsen  depressive symptoms)      At risk for falls    Fall precautions discussed      Alkaline phosphatase elevation    Recheck lab today, suspect bone turnover         Follow up plan: Return in about 3 months (around 03/10/2016) for visit and fasting labs.  An after-visit summary was printed and given to the patient at [place].  Please see the patient instructions which may contain other information and recommendations beyond what is mentioned above in the assessment and  plan.  Meds ordered this encounter  Medications  . Melatonin 5 MG CAPS    Sig:   . DISCONTD: alendronate (FOSAMAX) 70 MG tablet    Sig: Take 1 tablet (70 mg total) by mouth every 7 (seven) days. Take with a full glass of water on an empty stomach.    Dispense:  4 tablet    Refill:  5  . alendronate (FOSAMAX) 70 MG tablet    Sig: Take 1 tablet (70 mg total) by mouth every 7 (seven) days. Take with a full glass of water on an empty stomach.    Dispense:  13 tablet    Refill:  1    Orders Placed This Encounter  Procedures  . Hepatitis C antibody   Face-to-face time with patient was more than 25 minutes, >50% time spent counseling and coordination of care

## 2015-12-09 NOTE — Patient Instructions (Addendum)
I'll suggest Boost or Ensure daily Try to limit saturated fats in your diet (bologna, hot dogs, barbeque, cheeseburgers, hamburgers, steak, bacon, sausage, cheese, etc.) and get more fresh fruits, vegetables, and whole grains Three servings of calcium daily Start the new bone density medicine and take it once a week If you develop stomach upset on this medicine, stop it and call me right away Next bone scan will be in two years, May 2019 Have labs done today  Fall Prevention in the Home  Falls can cause injuries and can affect people from all age groups. There are many simple things that you can do to make your home safe and to help prevent falls. WHAT CAN I DO ON THE OUTSIDE OF MY HOME?  Regularly repair the edges of walkways and driveways and fix any cracks.  Remove high doorway thresholds.  Trim any shrubbery on the main path into your home.  Use bright outdoor lighting.  Clear walkways of debris and clutter, including tools and rocks.  Regularly check that handrails are securely fastened and in good repair. Both sides of any steps should have handrails.  Install guardrails along the edges of any raised decks or porches.  Have leaves, snow, and ice cleared regularly.  Use sand or salt on walkways during winter months.  In the garage, clean up any spills right away, including grease or oil spills. WHAT CAN I DO IN THE BATHROOM?  Use night lights.  Install grab bars by the toilet and in the tub and shower. Do not use towel bars as grab bars.  Use non-skid mats or decals on the floor of the tub or shower.  If you need to sit down while you are in the shower, use a plastic, non-slip stool.[place]land Kitchen  Keep the floor dry. Immediately clean up any water that spills on the floor.  Remove soap buildup in the tub or shower on a regular basis.  Attach bath mats securely with double-sided non-slip rug tape.  Remove throw rugs and other tripping hazards from the floor. WHAT CAN I DO IN  THE BEDROOM?  Use night lights.  Make sure that a bedside light is easy to reach.  Do not use oversized bedding that drapes onto the floor.  Have a firm chair that has side arms to use for getting dressed.  Remove throw rugs and other tripping hazards from the floor. WHAT CAN I DO IN THE KITCHEN?   Clean up any spills right away.  Avoid walking on wet floors.  Place frequently used items in easy-to-reach places.  If you need to reach for something above you, use a sturdy step stool that has a grab bar.  Keep electrical cables out of the way.  Do not use floor polish or wax that makes floors slippery. If you have to use wax, make sure that it is non-skid floor wax.  Remove throw rugs and other tripping hazards from the floor. WHAT CAN I DO IN THE STAIRWAYS?  Do not leave any items on the stairs.  Make sure that there are handrails on both sides of the stairs. Fix handrails that are broken or loose. Make sure that handrails are as long as the stairways.  Check any carpeting to make sure that it is firmly attached to the stairs. Fix any carpet that is loose or worn.  Avoid having throw rugs at the top or bottom of stairways, or secure the rugs with carpet tape to prevent them from moving.  Make sure  that you have a light switch at the top of the stairs and the bottom of the stairs. If you do not have them, have them installed. WHAT ARE SOME OTHER FALL PREVENTION TIPS?  Wear closed-toe shoes that fit well and support your feet. Wear shoes that have rubber soles or low heels.  When you use a stepladder, make sure that it is completely opened and that the sides are firmly locked. Have someone hold the ladder while you are using it. Do not climb a closed stepladder.  Add color or contrast paint or tape to grab bars and handrails in your home. Place contrasting color strips on the first and last steps.  Use mobility aids as needed, such as canes, walkers, scooters, and  crutches.  Turn on lights if it is dark. Replace any light bulbs that burn out.  Set up furniture so that there are clear paths. Keep the furniture in the same spot.  Fix any uneven floor surfaces.  Choose a carpet design that does not hide the edge of steps of a stairway.  Be aware of any and all pets.  Review your medicines with your healthcare provider. Some medicines can cause dizziness or changes in blood pressure, which increase your risk of falling. Talk with your health care provider about other ways that you can decrease your risk of falls. This may include working with a physical therapist or trainer to improve your strength, balance, and endurance.   This information is not intended to replace advice given to you by your health care provider. Make sure you discuss any questions you have with your health care provider.   Document Released: 07/14/2002 Document Revised: 12/08/2014 Document Reviewed: 08/28/2014 Elsevier Interactive Patient Education 2016 [employer]. Smoking Hazards Smoking cigarettes is extremely bad for your health. Tobacco smoke has over 200 known poisons in it. It contains the poisonous gases nitrogen oxide and carbon monoxide. There are over 60 chemicals in tobacco smoke that cause cancer. Some of the chemicals found in cigarette smoke include:   Cyanide.   Benzene.   Formaldehyde.   Methanol (wood alcohol).   Acetylene (fuel used in welding torches).   Ammonia.  Even smoking lightly shortens your life expectancy by several years. You can greatly reduce the risk of medical problems for you and your family by stopping now. Smoking is the most preventable cause of death and disease in our society. Within days of quitting smoking, your circulation improves, you decrease the risk of having a heart attack, and your lung capacity improves. There may be some increased phlegm in the first few days after quitting, and it may take months for your lungs to  clear up completely. Quitting for 10 years reduces your risk of developing lung cancer to almost that of a nonsmoker.  WHAT ARE THE RISKS OF SMOKING? Cigarette smokers have an increased risk of many serious medical problems, including:  Lung cancer.   Lung disease (such as pneumonia, bronchitis, and emphysema).   Heart attack and chest pain due to the heart not getting enough oxygen (angina).   Heart disease and peripheral blood vessel disease.   Hypertension.   Stroke.   Oral cancer (cancer of the lip, mouth, or voice box).   Bladder cancer.   Pancreatic cancer.   Cervical cancer.   Pregnancy complications, including premature birth.   Stillbirths and smaller newborn babies, birth defects, and genetic damage to sperm.   Early menopause.   Lower estrogen level for women.  Infertility.   Facial wrinkles.   Blindness.   Increased risk of broken bones (fractures).   Senile dementia.   Stomach ulcers and internal bleeding.   Delayed wound healing and increased risk of complications during surgery. Because of secondhand smoke exposure, children of smokers have an increased risk of the following:   Sudden infant death syndrome (SIDS).   Respiratory infections.   Lung cancer.   Heart disease.   Ear infections.  WHY IS SMOKING ADDICTIVE? Nicotine is the chemical agent in tobacco that is capable of causing addiction or dependence. When you smoke and inhale, nicotine is absorbed rapidly into the bloodstream through your lungs. Both inhaled and noninhaled nicotine may be addictive.  WHAT ARE THE BENEFITS OF QUITTING?  There are many health benefits to quitting smoking. Some are:   The likelihood of developing cancer and heart disease decreases. Health improvements are seen almost immediately.   Blood pressure, pulse rate, and breathing patterns start returning to normal soon after quitting.   People who quit may see an improvement in  their overall quality of life.  HOW DO YOU QUIT SMOKING? Smoking is an addiction with both physical and psychological effects, and longtime habits can be hard to change. Your health care provider can recommend:  Programs and community resources, which may include group support, education, or therapy.  Replacement products, such as patches, gum, and nasal sprays. Use these products only as directed. Do not replace cigarette smoking with electronic cigarettes (commonly called e-cigarettes). The safety of e-cigarettes is unknown, and some may contain harmful chemicals. FOR MORE INFORMATION  American Lung Association: www.lung.org  American Cancer Society: www.cancer.org   This information is not intended to replace advice given to you by your health care provider. Make sure you discuss any questions you have with your health care provider.   Document Released: 08/31/2004 Document Revised: 05/14/2013 Document Reviewed: 01/13/2013 Elsevier Interactive Patient Education [employer].

## 2015-12-14 LAB — HEPATITIS C ANTIBODY

## 2015-12-14 LAB — ALKALINE PHOSPHATASE, ISOENZYMES
ALK PHOS: 130 IU/L — AB (ref 39–117)
BONE FRACTION: 54 % (ref 14–68)
INTESTINAL FRAC.: 0 % (ref 0–18)
LIVER FRACTION: 46 % (ref 18–85)

## 2016-02-09 ENCOUNTER — Encounter: Payer: Self-pay | Admitting: Family Medicine

## 2016-02-09 DIAGNOSIS — Z1159 Encounter for screening for other viral diseases: Secondary | ICD-10-CM | POA: Insufficient documentation

## 2016-02-09 DIAGNOSIS — H9113 Presbycusis, bilateral: Secondary | ICD-10-CM | POA: Insufficient documentation

## 2016-02-09 NOTE — Assessment & Plan Note (Signed)
Followed by Dr. Nestor Lewandowsky, [place] surgeon; encouraged smoking cessation

## 2016-02-09 NOTE — Assessment & Plan Note (Signed)
Continue SSRI; patient reports doing well on medicine; she is off of benzo now (which can increase risk of falls and worsen depressive symptoms)

## 2016-02-09 NOTE — Assessment & Plan Note (Signed)
Encouraged healthier eating; limit saturated fats; continue statin

## 2016-02-09 NOTE — Assessment & Plan Note (Signed)
Encouraged cessation; smoking increases risk of bone loss, fractures, lung cancer, so many other health problems

## 2016-02-09 NOTE — Assessment & Plan Note (Signed)
ENT Dr. Richardson Landry

## 2016-02-09 NOTE — Assessment & Plan Note (Signed)
Discussed one-time hep C screening recommendation for individuals born between 1945-1965 per USPSTF guidelines; patient agrees with testing; Hep C Ab ordered 

## 2016-02-09 NOTE — Assessment & Plan Note (Signed)
Discussed risk of long-term PPI use including osteoporosis; will switch to H2 blocker; reasons to call reviewed; avoid triggers

## 2016-02-09 NOTE — Assessment & Plan Note (Signed)
Solid nodule, less than 2 cm on [place] from April 2017, followed by ENT Dr. Richardson Landry

## 2016-02-18 NOTE — Telephone Encounter (Signed)
x

## 2016-03-10 ENCOUNTER — Encounter: Payer: Self-pay | Admitting: Family Medicine

## 2016-03-10 ENCOUNTER — Ambulatory Visit (INDEPENDENT_AMBULATORY_CARE_PROVIDER_SITE_OTHER): Payer: Commercial Managed Care - HMO | Admitting: Family Medicine

## 2016-03-10 DIAGNOSIS — Z720 Tobacco use: Secondary | ICD-10-CM

## 2016-03-10 DIAGNOSIS — M21611 Bunion of right foot: Secondary | ICD-10-CM | POA: Diagnosis not present

## 2016-03-10 DIAGNOSIS — L989 Disorder of the skin and subcutaneous tissue, unspecified: Secondary | ICD-10-CM

## 2016-03-10 DIAGNOSIS — M8580 Other specified disorders of bone density and structure, unspecified site: Secondary | ICD-10-CM | POA: Diagnosis not present

## 2016-03-10 DIAGNOSIS — E785 Hyperlipidemia, unspecified: Secondary | ICD-10-CM | POA: Diagnosis not present

## 2016-03-10 DIAGNOSIS — E0789 Other specified disorders of thyroid: Secondary | ICD-10-CM

## 2016-03-10 DIAGNOSIS — R748 Abnormal levels of other serum enzymes: Secondary | ICD-10-CM

## 2016-03-10 DIAGNOSIS — D692 Other nonthrombocytopenic purpura: Secondary | ICD-10-CM | POA: Diagnosis not present

## 2016-03-10 DIAGNOSIS — K219 Gastro-esophageal reflux disease without esophagitis: Secondary | ICD-10-CM

## 2016-03-10 DIAGNOSIS — D497 Neoplasm of unspecified behavior of endocrine glands and other parts of nervous system: Secondary | ICD-10-CM | POA: Diagnosis not present

## 2016-03-10 DIAGNOSIS — G4700 Insomnia, unspecified: Secondary | ICD-10-CM

## 2016-03-10 LAB — COMPREHENSIVE METABOLIC PANEL
ALT: 4 U/L — ABNORMAL LOW (ref 6–29)
AST: 11 U/L (ref 10–35)
Albumin: 4.4 g/dL (ref 3.6–5.1)
Alkaline Phosphatase: 73 U/L (ref 33–130)
BUN: 11 mg/dL (ref 7–25)
CHLORIDE: 105 mmol/L (ref 98–110)
CO2: 27 mmol/L (ref 20–31)
CREATININE: 0.95 mg/dL (ref 0.50–0.99)
Calcium: 9.7 mg/dL (ref 8.6–10.4)
Glucose, Bld: 82 mg/dL (ref 65–99)
POTASSIUM: 4.1 mmol/L (ref 3.5–5.3)
SODIUM: 142 mmol/L (ref 135–146)
TOTAL PROTEIN: 6.8 g/dL (ref 6.1–8.1)
Total Bilirubin: 0.7 mg/dL (ref 0.2–1.2)

## 2016-03-10 LAB — CBC WITH DIFFERENTIAL/PLATELET
BASOS PCT: 1 %
Basophils Absolute: 49 cells/uL (ref 0–200)
EOS ABS: 49 {cells}/uL (ref 15–500)
Eosinophils Relative: 1 %
HEMATOCRIT: 38.1 % (ref 35.0–45.0)
Hemoglobin: 12.6 g/dL (ref 11.7–15.5)
Lymphocytes Relative: 40 %
Lymphs Abs: 1960 cells/uL (ref 850–3900)
MCH: 29.6 pg (ref 27.0–33.0)
MCHC: 33.1 g/dL (ref 32.0–36.0)
MCV: 89.6 fL (ref 80.0–100.0)
MONO ABS: 343 {cells}/uL (ref 200–950)
MONOS PCT: 7 %
MPV: 10.1 fL (ref 7.5–12.5)
NEUTROS ABS: 2499 {cells}/uL (ref 1500–7800)
Neutrophils Relative %: 51 %
PLATELETS: 197 10*3/uL (ref 140–400)
RBC: 4.25 MIL/uL (ref 3.80–5.10)
RDW: 13.8 % (ref 11.0–15.0)
WBC: 4.9 10*3/uL (ref 3.8–10.8)

## 2016-03-10 NOTE — Assessment & Plan Note (Signed)
Refer to podiatrist 

## 2016-03-10 NOTE — Assessment & Plan Note (Signed)
Right along lower right eyelid, sore; refer to derm

## 2016-03-10 NOTE — Progress Notes (Signed)
BP 122/72   Pulse 82   Temp 98 F (36.7 C) (Oral)   Resp 16   Wt 114 lb (51.7 kg)   SpO2 97%   BMI 19.57 kg/m    Subjective:    Patient ID: Robin Jones, female    DOB: 1950/11/04, 65 y.o.   MRN: [id number]  HPI: Robin Jones is a 65 y.o. female  Chief Complaint  Patient presents with  . Follow-up   Patient is here for follow-up She says that she hasn't taken any medicine for two weeks Her allergies are doing fine off of medicine High cholesterol; not taking any of the statin either I asked why she quit taking meds; she denies depression, says she is not depressed; just quit she does not sleep at night and sleeps during the day, sleep cycle off Breathing is fine; not using Advair any more either; just quit taking medicine Mood is okay; quit taking the sertraline completely; gets a little irritated sometimes; no thoughts of self harm or hurting others; she denies mania or hypomania HTN well-controlled today Dr. Richardson Landry takes care of thyroid, he checks once a year; nodule on the left side Mildly elevated alk phos; reviewed alk phos isoenzymes done in April; no abd pain; no nausea Knot on the side of the right foot, medial aspect Two rough spots, one on each lower leg; some bruises too where she scratches  Depression screen [facility] 2/9 03/10/2016 12/09/2015 03/03/2015  Decreased Interest 0 0 0  Down, Depressed, Hopeless 0 1 0  PHQ - 2 Score 0 1 0   Relevant past medical, surgical, family and social history reviewed Past Medical History:  Diagnosis Date  . Chronic kidney disease   . CKD (chronic kidney disease) stage 2, GFR 60-89 ml/min 03/03/2015  . Constipation   . Depression   . Depression, major, recurrent, in partial remission ([place]) 03/03/2015  . GERD (gastroesophageal reflux disease)   . Hearing loss of both ears   . Hyperlipidemia   . Insomnia 12/05/2015  . Osteopenia 12/09/2015  . Personal history of tobacco use, presenting hazards to health 03/23/2015  . Skin cancer of  face 2013   Left side of face resected.    Past Surgical History:  Procedure Laterality Date  . ABDOMINAL HYSTERECTOMY  1980   partial  . BACK SURGERY  1978  . COLONOSCOPY N/A 12/22/2014   Procedure: COLONOSCOPY;  Surgeon: Lucilla Lame, MD;  Location: [facility] ENDOSCOPY;  Service: Endoscopy;  Laterality: N/A;  . ELECTROMAGNETIC NAVIGATION BROCHOSCOPY N/A 04/15/2015   Procedure: ELECTROMAGNETIC NAVIGATION BRONCHOSCOPY;  Surgeon: Flora Lipps, MD;  Location: [facility] ORS;  Service: Cardiopulmonary;  Laterality: N/A;  . ENDOBRONCHIAL ULTRASOUND N/A 04/15/2015   Procedure: ENDOBRONCHIAL ULTRASOUND;  Surgeon: Flora Lipps, MD;  Location: [facility] ORS;  Service: Cardiopulmonary;  Laterality: N/A;  . ENDOBRONCHIAL ULTRASOUND N/A 06/15/2015   Procedure: ENDOBRONCHIAL ULTRASOUND;  Surgeon: Flora Lipps, MD;  Location: [facility] ORS;  Service: Cardiopulmonary;  Laterality: N/A;  . INNER EAR SURGERY Left 2013  . TONSILLECTOMY  11   Family History  Problem Relation Age of Onset  . Heart disease Mother   . Thyroid disease Father   . Hepatitis C Sister   . Thyroid disease Sister   . Cancer Brother 55    in muscle of leg  . Heart disease Brother   . Liver cancer Sister 40  . COPD Sister   . Diabetes Maternal Grandmother    Social History  Substance Use Topics  . Smoking  status: Current Every Day Smoker    Packs/day: 1.00    Years: 45.00    Types: Cigarettes  . Smokeless tobacco: Never Used  . Alcohol use No   Interim medical history since last visit reviewed. Allergies and medications reviewed  Review of Systems Per HPI unless specifically indicated above     Objective:    BP 122/72   Pulse 82   Temp 98 F (36.7 C) (Oral)   Resp 16   Wt 114 lb (51.7 kg)   SpO2 97%   BMI 19.57 kg/m   Wt Readings from Last 3 Encounters:  03/10/16 114 lb (51.7 kg)  12/09/15 113 lb (51.3 kg)  11/11/15 117 lb (53.1 kg)    Physical Exam  Constitutional: She appears well-developed.  Thin, appears older than stated  age; weight gain one pound over last 3 months  HENT:  Right Ear: Decreased hearing is noted.  Left Ear: Decreased hearing is noted.  Mouth/Throat: Mucous membranes are normal.  Very hard of hearing  Eyes: EOM are normal. No scleral icterus.    Shallow ulcerative lesion medial lower lid of RIGHT eye; no drainage  Neck: Thyroid mass (palpable swelling medial left lobe thyroid) present.  Cardiovascular: Normal rate and regular rhythm.   Pulmonary/Chest: Effort normal and breath sounds normal.  Musculoskeletal:  Bunion right foot  Skin: Ecchymosis and lesion (scaly lesion on each shin; no real induration) noted. No petechiae noted. No pallor.  Purpura on right shin (where patient scratched her leg); surgical scar left cheek  Psychiatric: She has a normal mood and affect. Her behavior is normal. Her mood appears not anxious. Cognition and memory are not impaired. She does not exhibit a depressed mood.      Assessment & Plan:   Problem List Items Addressed This Visit      Cardiovascular and Mediastinum   Senile purpura ([place])   Relevant Orders   CBC with Differential/Platelet (Completed)   PTT (Completed)   Protime-INR     Digestive   GERD without esophagitis    Off of PPI; control with diet and/or H2 blocker        Musculoskeletal and Integument   Skin lesion of left leg    Refer to dermatologist; may have AK on lower legs      Relevant Orders   Ambulatory referral to Dermatology   Skin lesion of face    Right along lower right eyelid, sore; refer to derm      Relevant Orders   Ambulatory referral to Dermatology   Bunion of right foot    Refer to podiatrist      Relevant Orders   Ambulatory referral to Podiatry     Other   Tobacco abuse    Patient not ready to quit      Thyroid mass of unclear etiology    Monitored by Dr. Richardson Landry      Insomnia    Disturbance of circadian rhythm; stop melatonin; try to avoid daytime naps and move sleep time back 15-30  minutes each week, then keep sleep time consistent      Hyperlipidemia LDL goal <100    Controlled; limit saturated fats      Alkaline phosphatase elevation    check      Relevant Orders   Comprehensive Metabolic Panel (CMET) (Completed)   VITAMIN D 25 Hydroxy (Vit-D Deficiency, Fractures) (Completed)    Other Visit Diagnoses   None.     Follow up plan: Return in about  6 months (around 09/10/2016) for fasting labs and visit with Dr. Sanda Klein.  An after-visit summary was printed and given to the patient at [place].  Please see the patient instructions which may contain other information and recommendations beyond what is mentioned above in the assessment and plan.  No orders of the defined types were placed in this encounter.   Orders Placed This Encounter  Procedures  . Comprehensive Metabolic Panel (CMET)  . CBC with Differential/Platelet  . PTT  . Protime-INR  . VITAMIN D 25 Hydroxy (Vit-D Deficiency, Fractures)  . Ambulatory referral to Podiatry  . Ambulatory referral to Dermatology

## 2016-03-10 NOTE — Assessment & Plan Note (Signed)
Monitored by Dr. Richardson Landry

## 2016-03-10 NOTE — Assessment & Plan Note (Signed)
Off of PPI; control with diet and/or H2 blocker

## 2016-03-10 NOTE — Assessment & Plan Note (Signed)
Controlled; limit saturated fats

## 2016-03-10 NOTE — Assessment & Plan Note (Signed)
Refer to dermatologist; may have AK on lower legs

## 2016-03-10 NOTE — Assessment & Plan Note (Signed)
check

## 2016-03-10 NOTE — Patient Instructions (Addendum)
STOP the melatonin for now Move your sleep clock back about 30 minutes every week; if that's too much, try 15 minutes each week Take the Fosamax in the MORNING Let's get labs I've referred you to the skin doctor and the foot doctor Try to limit saturated fats in your diet (bologna, hot dogs, barbeque, cheeseburgers, hamburgers, steak, bacon, sausage, cheese, etc.) and get more fresh fruits, vegetables, and whole grains I do encourage you to quit smoking Call [phone] to sign up for smoking cessation classes You can call 1-800-QUIT-NOW to talk with a smoking cessation coach

## 2016-03-11 LAB — VITAMIN D 25 HYDROXY (VIT D DEFICIENCY, FRACTURES): VIT D 25 HYDROXY: 37 ng/mL (ref 30–100)

## 2016-03-11 LAB — APTT: aPTT: 28 s (ref 22–34)

## 2016-03-12 NOTE — Assessment & Plan Note (Signed)
Disturbance of circadian rhythm; stop melatonin; try to avoid daytime naps and move sleep time back 15-30 minutes each week, then keep sleep time consistent

## 2016-03-12 NOTE — Assessment & Plan Note (Signed)
Patient not ready to quit

## 2016-03-21 ENCOUNTER — Encounter: Payer: Self-pay | Admitting: Podiatry

## 2016-03-21 ENCOUNTER — Ambulatory Visit (INDEPENDENT_AMBULATORY_CARE_PROVIDER_SITE_OTHER): Payer: Commercial Managed Care - HMO

## 2016-03-21 ENCOUNTER — Ambulatory Visit (INDEPENDENT_AMBULATORY_CARE_PROVIDER_SITE_OTHER): Payer: Commercial Managed Care - HMO | Admitting: Podiatry

## 2016-03-21 DIAGNOSIS — R52 Pain, unspecified: Secondary | ICD-10-CM

## 2016-03-21 DIAGNOSIS — M2011 Hallux valgus (acquired), right foot: Secondary | ICD-10-CM | POA: Diagnosis not present
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
## 2016-03-21 NOTE — Patient Instructions (Signed)

## 2016-03-26 DIAGNOSIS — M2010 Hallux valgus (acquired), unspecified foot: Secondary | ICD-10-CM | POA: Insufficient documentation

## 2016-04-14 ENCOUNTER — Other Ambulatory Visit: Payer: Self-pay | Admitting: Family Medicine

## 2016-04-14 NOTE — Telephone Encounter (Signed)
rx sent

## 2016-04-25 ENCOUNTER — Telehealth: Payer: Self-pay

## 2016-04-25 NOTE — Telephone Encounter (Signed)
Patient's Follow-up was moved to 05/12/16 due to clinic changes since last visit. Patient would also like to move her CT scan to the same day.  CT scan was rescheduled to 05/12/16 at 0900am at [facility]. Patient will need to just have liquids 4 hours prior to scan.  Call was made to patient at this time and she was given all above information. She confirmed all information above.

## 2016-04-27 ENCOUNTER — Ambulatory Visit: Payer: Commercial Managed Care - HMO | Admitting: Cardiothoracic Surgery

## 2016-04-27 ENCOUNTER — Ambulatory Visit: Admission: RE | Admit: 2016-04-27 | Payer: Commercial Managed Care - HMO | Source: Ambulatory Visit

## 2016-05-05 ENCOUNTER — Ambulatory Visit: Payer: Commercial Managed Care - HMO

## 2016-05-05 ENCOUNTER — Ambulatory Visit: Payer: Commercial Managed Care - HMO | Admitting: Cardiothoracic Surgery

## 2016-05-08 ENCOUNTER — Other Ambulatory Visit: Payer: Self-pay | Admitting: Cardiothoracic Surgery

## 2016-05-08 DIAGNOSIS — R911 Solitary pulmonary nodule: Secondary | ICD-10-CM

## 2016-05-10 ENCOUNTER — Encounter: Payer: Self-pay | Admitting: Family Medicine

## 2016-05-10 ENCOUNTER — Ambulatory Visit (INDEPENDENT_AMBULATORY_CARE_PROVIDER_SITE_OTHER): Payer: Commercial Managed Care - HMO | Admitting: Family Medicine

## 2016-05-10 VITALS — BP 120/70 | HR 95 | Temp 97.9°F | Resp 16 | Ht 64.0 in | Wt 113.9 lb

## 2016-05-10 DIAGNOSIS — J0100 Acute maxillary sinusitis, unspecified: Secondary | ICD-10-CM

## 2016-05-10 MED ORDER — AZITHROMYCIN 250 MG PO TABS: ORAL_TABLET | ORAL | 0 refills | Status: DC

## 2016-05-10 NOTE — Progress Notes (Signed)
Name: Robin Jones   MRN: [id number]    DOB: Feb 25, 1951   Date:05/10/2016       Progress Note  Subjective  Chief Complaint  Chief Complaint  Patient presents with  . URI    for 1 week  . Facial Pain  This patient is usually followed by Dr. Sanda Klein, new to me  Sinusitis  This is a new problem. The current episode started in the past 7 days. The problem is unchanged. There has been no fever. Associated symptoms include headaches and sinus pressure. Pertinent negatives include no chills, ear pain or sore throat. Past treatments include oral decongestants (took mucinex the first few days).    Past Medical History:  Diagnosis Date  . Chronic kidney disease   . CKD (chronic kidney disease) stage 2, GFR 60-89 ml/min 03/03/2015  . Constipation   . Depression   . Depression, major, recurrent, in partial remission ([place]) 03/03/2015  . GERD (gastroesophageal reflux disease)   . Hearing loss of both ears   . Hyperlipidemia   . Insomnia 12/05/2015  . Osteopenia 12/09/2015  . Personal history of tobacco use, presenting hazards to health 03/23/2015  . Skin cancer of face 2013   Left side of face resected.     Past Surgical History:  Procedure Laterality Date  . ABDOMINAL HYSTERECTOMY  1980   partial  . BACK SURGERY  1978  . COLONOSCOPY N/A 12/22/2014   Procedure: COLONOSCOPY;  Surgeon: Lucilla Lame, MD;  Location: [facility];  Service: Endoscopy;  Laterality: N/A;  . ELECTROMAGNETIC NAVIGATION BROCHOSCOPY N/A 04/15/2015   Procedure: ELECTROMAGNETIC NAVIGATION BRONCHOSCOPY;  Surgeon: Flora Lipps, MD;  Location: [facility];  Service: Cardiopulmonary;  Laterality: N/A;  . ENDOBRONCHIAL ULTRASOUND N/A 04/15/2015   Procedure: ENDOBRONCHIAL ULTRASOUND;  Surgeon: Flora Lipps, MD;  Location: [facility] OR[facility];  Service: Cardiopulmonary;  Laterality: N/A;  . ENDOBRONCHIAL ULTRASOUND N/A 06/15/2015   Procedure: ENDOBRONCHIAL ULTRASOUND;  Surgeon: Flora Lipps, MD;  Location: [facility] OR[facility];  Service: Cardiopulmonary;   Laterality: N/A;  . INNER EAR SURGERY Left 2013  . TONSILLECTOMY  13    Family History  Problem Relation Age of Onset  . Thyroid disease Father   . Heart disease Mother   . Hepatitis C Sister   . Thyroid disease Sister   . Cancer Brother 55    in muscle of leg  . Heart disease Brother   . Liver cancer Sister 54  . COPD Sister   . Diabetes Maternal Grandmother     Social History   Social History  . Marital status: Divorced    Spouse name: N/A  . Number of children: N/A  . Years of education: N/A   Occupational History  . Not on file.   Social History Main Topics  . Smoking status: Current Every Day Smoker    Packs/day: 1.00    Years: 45.00    Types: Cigarettes  . Smokeless tobacco: Never Used  . Alcohol use No  . Drug use: No  . Sexual activity: No   Other Topics Concern  . Not on file   Social History Narrative  . No narrative on file     Current Outpatient Prescriptions:  .  acetaminophen (TYLENOL) 325 MG tablet, Take 650 mg by mouth every 6 (six) hours as needed for moderate pain or headache., Disp: , Rfl:  .  ADVAIR DISKUS 250-50 MCG/DOSE AEPB, Inhale 1 puff into the lungs 2 (two) times daily. For shortness of breath, Disp: 180 each, Rfl: 1 .  alendronate (FOSAMAX) 70 MG tablet, TAKE 1 TABLET EVERY 7 (SEVEN) DAYS. TAKE WITH A FULL GLASS OF WATER ON AN EMPTY STOMACH., Disp: 12 tablet, Rfl: 1 .  docusate sodium (COLACE) 100 MG capsule, Take 100 mg by mouth every other day. , Disp: , Rfl:  .  fexofenadine (ALLEGRA) 180 MG tablet, Take 180 mg by mouth daily., Disp: , Rfl:  .  fluticasone (FLONASE) 50 MCG/ACT nasal spray, Place 2 sprays into both nostrils as needed. , Disp: , Rfl:  .  lovastatin (MEVACOR) 40 MG tablet, Take 1 tablet (40 mg total) by mouth at bedtime., Disp: 90 tablet, Rfl: 1 .  PROAIR HFA 108 (90 BASE) MCG/ACT inhaler, Inhale 2 puffs into the lungs every 6 (six) hours as needed for wheezing or shortness of breath. , Disp: , Rfl: 0 .  ranitidine  (ZANTAC) 300 MG tablet, Take 1 tablet (300 mg total) by mouth at bedtime., Disp: 90 tablet, Rfl: 1 .  sertraline (ZOLOFT) 100 MG tablet, Take 1.5 tablets (150 mg total) by mouth at bedtime., Disp: 135 tablet, Rfl: 1 .  tiZANidine (ZANAFLEX) 4 MG tablet, , Disp: , Rfl:   No Known Allergies   Review of Systems  Constitutional: Negative for chills.  HENT: Positive for sinus pressure. Negative for ear pain and sore throat.   Neurological: Positive for headaches.    Objective  Vitals:   05/10/16 1125  BP: 120/70  Pulse: 95  Resp: 16  Temp: 97.9 F (36.6 C)  TempSrc: Oral  SpO2: 97%  Weight: 113 lb 14.4 oz (51.7 kg)  Height: 5\' 4"  (1.626 m)    Physical Exam  Constitutional: She is oriented to person, place, and time and well-developed, well-nourished, and in no distress.  HENT:  Head: Normocephalic and atraumatic.  Right Ear: Tympanic membrane and ear canal normal.  Left Ear: Tympanic membrane and ear canal normal.  Nose: Right sinus exhibits maxillary sinus tenderness. Left sinus exhibits maxillary sinus tenderness.  Mouth/Throat: No posterior oropharyngeal erythema.  Cardiovascular: Normal rate, regular rhythm, S1 normal and S2 normal.   Pulmonary/Chest: Effort normal and breath sounds normal. She has no rhonchi.  Neurological: She is alert and oriented to person, place, and time.  Psychiatric: Mood, memory, affect and judgment normal.  Nursing note and vitals reviewed.     Assessment & Plan  1. Acute non-recurrent maxillary sinusitis Started on antibiotic therapy for acute sinusitis. - azithromycin (ZITHROMAX) 250 MG tablet; 2 tabs po day 1, then 1 tab po q day x  4 days  Dispense: 6 tablet; Refill: 0   Ernesto Lashway [facility] 05/10/2016 11:38 AM

## 2016-05-12 ENCOUNTER — Ambulatory Visit: Payer: Self-pay | Admitting: Cardiothoracic Surgery

## 2016-05-12 ENCOUNTER — Ambulatory Visit: Payer: Commercial Managed Care - HMO

## 2016-05-19 ENCOUNTER — Encounter: Payer: Self-pay | Admitting: Cardiothoracic Surgery

## 2016-05-19 ENCOUNTER — Ambulatory Visit
Admission: RE | Admit: 2016-05-19 | Discharge: 2016-05-19 | Disposition: A | Discharge status: Home / Self Care | Payer: Commercial Managed Care - HMO | Source: Ambulatory Visit | Attending: Cardiothoracic Surgery | Admitting: Cardiothoracic Surgery

## 2016-05-19 ENCOUNTER — Ambulatory Visit: Payer: Commercial Managed Care - HMO | Admitting: Cardiothoracic Surgery

## 2016-05-19 ENCOUNTER — Ambulatory Visit (INDEPENDENT_AMBULATORY_CARE_PROVIDER_SITE_OTHER): Payer: Commercial Managed Care - HMO | Admitting: Cardiothoracic Surgery

## 2016-05-19 ENCOUNTER — Telehealth: Payer: Self-pay

## 2016-05-19 VITALS — BP 135/72 | HR 60 | Temp 97.9°F | Resp 18 | Ht 64.0 in | Wt 116.0 lb

## 2016-05-19 DIAGNOSIS — I700 Atherosclerosis of aorta: Secondary | ICD-10-CM | POA: Insufficient documentation

## 2016-05-19 DIAGNOSIS — J9809 Other diseases of bronchus, not elsewhere classified: Secondary | ICD-10-CM | POA: Diagnosis not present

## 2016-05-19 DIAGNOSIS — R911 Solitary pulmonary nodule: Secondary | ICD-10-CM

## 2016-05-19 DIAGNOSIS — I2510 Atherosclerotic heart disease of native coronary artery without angina pectoris: Secondary | ICD-10-CM | POA: Diagnosis not present

## 2016-05-19 DIAGNOSIS — R918 Other nonspecific abnormal finding of lung field: Secondary | ICD-10-CM | POA: Diagnosis not present

## 2016-05-19 DIAGNOSIS — J439 Emphysema, unspecified: Secondary | ICD-10-CM | POA: Insufficient documentation

## 2016-05-19 LAB — POCT I-STAT CREATININE: Creatinine, Ser: 1 mg/dL (ref 0.44–1.00)

## 2016-05-19 MED ORDER — IOPAMIDOL (ISOVUE-300) INJECTION 61%
75.0000 mL | Freq: Once | INTRAVENOUS | Status: AC | PRN
  Administered 2016-05-19: 75 mL via INTRAVENOUS

## 2016-05-19 NOTE — Telephone Encounter (Signed)
Notified patient at this time of the below appointments. Dr.oaks read results of Ct scan to patient and patient verbalized understanding.

## 2016-05-19 NOTE — Addendum Note (Signed)
Addended by: Celene Kras on: 05/19/2016 12:14 PM   Modules accepted: Orders

## 2016-05-19 NOTE — Progress Notes (Signed)
  Patient ID: Robin Jones, female   DOB: 1950/10/06, 65 y.o.   MRN: [id number]  HISTORY: She returns today in follow-up. Unfortunately she does continue to smoke at least a half pack cigarettes a day. However she denied any shortness of breath cough or fever.   Vitals:   05/19/16 0956  BP: 135/72  Pulse: 60  Resp: 18  Temp: 97.9 F (36.6 C)     EXAM:    Resp: Lungs are clear bilaterally But distant..  No respiratory distress, normal effort. Heart:  Regular without murmurs.  There is a very soft left carotid bruit Abd:  Abdomen is soft, non distended and non tender. No masses are palpable.  There is no rebound and no guarding.  Neurological: Alert and oriented to person, place, and time. Coordination normal.  Skin: Skin is warm and dry. No rash noted. No diaphoretic. No erythema. No pallor.  Psychiatric: Normal mood and affect. Normal behavior. Judgment and thought content normal.    ASSESSMENT: I have independently reviewed the patient's CT scan. The dominant mass in the right upper lobe is essentially unchanged. I asked the patient to contact me later today or Monday to get the official report.   PLAN:   I would like to see the patient back again in 6 months. We will obtain another noncontrast chest CT.

## 2016-05-19 NOTE — Patient Instructions (Signed)
I will call you with a 6 month follow up Ct scan appointment and a follow up with Dr.Oaks. Please call our office later this afternoon or on Monday so we can read to you what the Radiologist has said regarding your results from your scan today. Please call our office if you have questions or concerns.

## 2016-06-26 DIAGNOSIS — L853 Xerosis cutis: Secondary | ICD-10-CM | POA: Diagnosis not present

## 2016-06-26 DIAGNOSIS — D1800 Hemangioma unspecified site: Secondary | ICD-10-CM | POA: Diagnosis not present

## 2016-06-26 DIAGNOSIS — L905 Scar conditions and fibrosis of skin: Secondary | ICD-10-CM | POA: Diagnosis not present

## 2016-06-26 DIAGNOSIS — Z85828 Personal history of other malignant neoplasm of skin: Secondary | ICD-10-CM | POA: Diagnosis not present

## 2016-06-26 DIAGNOSIS — L720 Epidermal cyst: Secondary | ICD-10-CM | POA: Diagnosis not present

## 2016-06-26 DIAGNOSIS — L821 Other seborrheic keratosis: Secondary | ICD-10-CM | POA: Diagnosis not present

## 2016-07-08 ENCOUNTER — Encounter: Payer: Self-pay | Admitting: Family Medicine

## 2016-07-25 DIAGNOSIS — H903 Sensorineural hearing loss, bilateral: Secondary | ICD-10-CM | POA: Diagnosis not present

## 2016-09-11 ENCOUNTER — Encounter: Payer: Self-pay | Admitting: Family Medicine

## 2016-09-11 ENCOUNTER — Ambulatory Visit (INDEPENDENT_AMBULATORY_CARE_PROVIDER_SITE_OTHER): Payer: Medicare HMO | Admitting: Family Medicine

## 2016-09-11 VITALS — BP 114/64 | HR 75 | Temp 98.2°F | Resp 16 | Wt 112.5 lb

## 2016-09-11 DIAGNOSIS — E0789 Other specified disorders of thyroid: Secondary | ICD-10-CM | POA: Diagnosis not present

## 2016-09-11 DIAGNOSIS — Z23 Encounter for immunization: Secondary | ICD-10-CM | POA: Diagnosis not present

## 2016-09-11 DIAGNOSIS — N182 Chronic kidney disease, stage 2 (mild): Secondary | ICD-10-CM | POA: Diagnosis not present

## 2016-09-11 DIAGNOSIS — R918 Other nonspecific abnormal finding of lung field: Secondary | ICD-10-CM | POA: Diagnosis not present

## 2016-09-11 DIAGNOSIS — F3341 Major depressive disorder, recurrent, in partial remission: Secondary | ICD-10-CM

## 2016-09-11 DIAGNOSIS — Z720 Tobacco use: Secondary | ICD-10-CM | POA: Diagnosis not present

## 2016-09-11 DIAGNOSIS — M8589 Other specified disorders of bone density and structure, multiple sites: Secondary | ICD-10-CM

## 2016-09-11 DIAGNOSIS — D692 Other nonthrombocytopenic purpura: Secondary | ICD-10-CM

## 2016-09-11 DIAGNOSIS — K219 Gastro-esophageal reflux disease without esophagitis: Secondary | ICD-10-CM | POA: Diagnosis not present

## 2016-09-11 DIAGNOSIS — Z5181 Encounter for therapeutic drug level monitoring: Secondary | ICD-10-CM

## 2016-09-11 DIAGNOSIS — E785 Hyperlipidemia, unspecified: Secondary | ICD-10-CM | POA: Diagnosis not present

## 2016-09-11 LAB — CBC WITH DIFFERENTIAL/PLATELET
Basophils Absolute: 57 cells/uL (ref 0–200)
Basophils Relative: 1 %
Eosinophils Absolute: 114 cells/uL (ref 15–500)
Eosinophils Relative: 2 %
HEMATOCRIT: 35.9 % (ref 35.0–45.0)
Hemoglobin: 11.9 g/dL (ref 11.7–15.5)
LYMPHS PCT: 35 %
Lymphs Abs: 1995 cells/uL (ref 850–3900)
MCH: 30.1 pg (ref 27.0–33.0)
MCHC: 33.1 g/dL (ref 32.0–36.0)
MCV: 90.7 fL (ref 80.0–100.0)
MONO ABS: 342 {cells}/uL (ref 200–950)
MONOS PCT: 6 %
MPV: 10.2 fL (ref 7.5–12.5)
NEUTROS PCT: 56 %
Neutro Abs: 3192 cells/uL (ref 1500–7800)
PLATELETS: 192 10*3/uL (ref 140–400)
RBC: 3.96 MIL/uL (ref 3.80–5.10)
RDW: 13.7 % (ref 11.0–15.0)
WBC: 5.7 10*3/uL (ref 3.8–10.8)

## 2016-09-11 LAB — COMPLETE METABOLIC PANEL WITH GFR
ALT: 5 U/L — AB (ref 6–29)
AST: 11 U/L (ref 10–35)
Albumin: 4 g/dL (ref 3.6–5.1)
Alkaline Phosphatase: 78 U/L (ref 33–130)
BUN: 14 mg/dL (ref 7–25)
CALCIUM: 9.5 mg/dL (ref 8.6–10.4)
CHLORIDE: 106 mmol/L (ref 98–110)
CO2: 32 mmol/L — AB (ref 20–31)
Creat: 0.88 mg/dL (ref 0.50–0.99)
GFR, EST AFRICAN AMERICAN: 78 mL/min (ref >=60)
GFR, Est Non African American: 68 mL/min (ref >=60)
Glucose, Bld: 70 mg/dL (ref 65–99)
POTASSIUM: 3.7 mmol/L (ref 3.5–5.3)
Sodium: 142 mmol/L (ref 135–146)
Total Bilirubin: 0.5 mg/dL (ref 0.2–1.2)
Total Protein: 6.4 g/dL (ref 6.1–8.1)

## 2016-09-11 LAB — LIPID PANEL
CHOL/HDL RATIO: 3.7 ratio (ref <5.0)
Cholesterol: 172 mg/dL (ref <200)
HDL: 46 mg/dL — AB (ref >50)
LDL CALC: 96 mg/dL (ref <100)
Triglycerides: 151 mg/dL — ABNORMAL HIGH (ref <150)
VLDL: 30 mg/dL (ref <30)

## 2016-09-11 MED ORDER — ESCITALOPRAM OXALATE 10 MG PO TABS
10.0000 mg | ORAL_TABLET | Freq: Every day | ORAL | 0 refills | Status: DC
Start: 2016-09-11 — End: 2016-09-11

## 2016-09-11 MED ORDER — ESCITALOPRAM OXALATE 10 MG PO TABS: 10.0000 mg | ORAL_TABLET | Freq: Every day | ORAL | 0 refills | Status: DC

## 2016-09-11 NOTE — Assessment & Plan Note (Signed)
Managed by ENT, Dr. Richardson Landry; re-refer back to ENT

## 2016-09-11 NOTE — Assessment & Plan Note (Signed)
Check lipids, limit saturated fats 

## 2016-09-11 NOTE — Assessment & Plan Note (Signed)
Not as bad as before; no bleeding elsewhere except one nosebleed one month ago

## 2016-09-11 NOTE — Assessment & Plan Note (Signed)
Followed by Dr. Genevive Bi, [place] surgeon

## 2016-09-11 NOTE — Assessment & Plan Note (Signed)
Check liver and kidneys 

## 2016-09-11 NOTE — Assessment & Plan Note (Signed)
Get started on new SSRI

## 2016-09-11 NOTE — Progress Notes (Signed)
BP 114/64   Pulse 75   Temp 98.2 F (36.8 C) (Oral)   Resp 16   Wt 112 lb 8 oz (51 kg)   SpO2 95%   BMI 19.31 kg/m    Subjective:    Patient ID: Robin Jones, female    DOB: 1950-08-10, 66 y.o.   MRN: [id number]  HPI: Robin Jones is a 66 y.o. female  Chief Complaint  Patient presents with  . Follow-up    6 month    She is dealing with the loss of her sister-in-law She cannot take the nerve medicine; makes her sick to her stomach She quit taking all of her medicines No SI/HI High cholesterol; quit taking statin; no side effects; not many fatty meats Osteopenia; quit taking bisphosphonate; just didn't care if she took anything; no stomach upset; eats a fair amount of greens; last DEXA May 2017 reviewed Normal vitamin D, 37 August 2017 GERD; quit taking the h2 blocker and has not had any problems with her heartburn since stopping it Bruising on her arms has gotten better; she denies bleeding except for a single nosebleed that happened about a month ago when she was upset She is seeing Dr. Genevive Bi for her lung mass; she has a thyroid mass and sees ENT and needs another referral for that Still smoking 3 packs a week; not ready to quit; she has cut down a lot  Depression screen [facility] 2/9 09/11/2016 03/10/2016 12/09/2015 03/03/2015  Decreased Interest 1 0 0 0  Down, Depressed, Hopeless 1 0 1 0  PHQ - 2 Score 2 0 1 0  Altered sleeping 1 - - -  Tired, decreased energy 1 - - -  Change in appetite 1 - - -  Feeling bad or failure about yourself  1 - - -  Trouble concentrating 0 - - -  Moving slowly or fidgety/restless 1 - - -  Suicidal thoughts 0 - - -  PHQ-9 Score 7 - - -  Difficult doing work/chores Not difficult at all - - -   Relevant past medical, surgical, family and social history reviewed Past Medical History:  Diagnosis Date  . Chronic kidney disease   . CKD (chronic kidney disease) stage 2, GFR 60-89 ml/min 03/03/2015  . Constipation   . Depression   . Depression, major,  recurrent, in partial remission ([place]) 03/03/2015  . GERD (gastroesophageal reflux disease)   . Hearing loss of both ears   . Hyperlipidemia   . Insomnia 12/05/2015  . Osteopenia 12/09/2015  . Personal history of tobacco use, presenting hazards to health 03/23/2015  . Skin cancer of face 2013   Left side of face resected.    Past Surgical History:  Procedure Laterality Date  . ABDOMINAL HYSTERECTOMY  1980   partial  . BACK SURGERY  1978  . COLONOSCOPY N/A 12/22/2014   Procedure: COLONOSCOPY;  Surgeon: Lucilla Lame, MD;  Location: [facility];  Service: Endoscopy;  Laterality: N/A;  . ELECTROMAGNETIC NAVIGATION BROCHOSCOPY N/A 04/15/2015   Procedure: ELECTROMAGNETIC NAVIGATION BRONCHOSCOPY;  Surgeon: Flora Lipps, MD;  Location: [facility];  Service: Cardiopulmonary;  Laterality: N/A;  . ENDOBRONCHIAL ULTRASOUND N/A 04/15/2015   Procedure: ENDOBRONCHIAL ULTRASOUND;  Surgeon: Flora Lipps, MD;  Location: [facility];  Service: Cardiopulmonary;  Laterality: N/A;  . ENDOBRONCHIAL ULTRASOUND N/A 06/15/2015   Procedure: ENDOBRONCHIAL ULTRASOUND;  Surgeon: Flora Lipps, MD;  Location: [facility];  Service: Cardiopulmonary;  Laterality: N/A;  . INNER EAR SURGERY Left 2013  . TONSILLECTOMY  13   Family History  Problem Relation Age of Onset  . Thyroid disease Father   . Heart disease Mother   . Hepatitis C Sister   . Thyroid disease Sister   . Cancer Brother 55    in muscle of leg  . Heart disease Brother   . Liver cancer Sister 87  . COPD Sister   . Diabetes Maternal Grandmother    Social History  Substance Use Topics  . Smoking status: Current Every Day Smoker    Packs/day: 1.00    Years: 45.00    Types: Cigarettes  . Smokeless tobacco: Never Used  . Alcohol use No    Interim medical history since last visit reviewed. Allergies and medications reviewed  Review of Systems Per HPI unless specifically indicated above     Objective:    BP 114/64   Pulse 75   Temp 98.2 F (36.8 C) (Oral)    Resp 16   Wt 112 lb 8 oz (51 kg)   SpO2 95%   BMI 19.31 kg/m   Wt Readings from Last 3 Encounters:  09/11/16 112 lb 8 oz (51 kg)  05/19/16 116 lb (52.6 kg)  05/10/16 113 lb 14.4 oz (51.7 kg)    Physical Exam  Constitutional: She appears well-developed.  Thin  HENT:  Mouth/Throat: Oropharynx is clear and moist and mucous membranes are normal.  Hearing aides  Eyes: EOM are normal. No scleral icterus.  Cardiovascular: Normal rate and regular rhythm.   Pulmonary/Chest: Effort normal and breath sounds normal.  Skin: No pallor.  Psychiatric: Her behavior is normal. Her mood appears not anxious. Her speech is not delayed. She is not slowed and not withdrawn. She exhibits a depressed mood. She expresses no homicidal and no suicidal ideation.  Briefly tearful when discussing personal loss; good eye contact with examiner; good hygiene   Results for orders placed or performed during the hospital encounter of 05/19/16  I-STAT creatinine  Result Value Ref Range   Creatinine, Ser 1.00 0.44 - 1.00 mg/dL      Assessment & Plan:   Problem List Items Addressed This Visit      Cardiovascular and Mediastinum   Senile purpura ([place])    Not as bad as before; no bleeding elsewhere except one nosebleed one month ago      Relevant Orders   CBC with Differential/Platelet     Digestive   GERD without esophagitis    Controlled without medicine, avoid triggers        Musculoskeletal and Integument   Osteopenia    Start back on bisphosphonate; three servings of calcium a day; vit D3 take 1,000 iu day        Genitourinary   CKD (chronic kidney disease) stage 2, GFR 60-89 ml/min    Avoid NSAIDs, stay hydrated        Other   Tobacco abuse    Encouraged pt to quit; she's trying to cut down; I'm here to help if needed      Thyroid mass of unclear etiology    Managed by ENT, Dr. Richardson Landry; re-refer back to ENT      Relevant Orders   Ambulatory referral to ENT   Medication monitoring  encounter    Check liver and kidneys      Relevant Orders   COMPLETE METABOLIC PANEL WITH GFR   Lung mass    Followed by Dr. Genevive Bi, [place] surgeon      Hyperlipidemia LDL goal <100 - Primary  Check lipids, limit saturated fats      Relevant Orders   Lipid panel   Depression, major, recurrent, in partial remission ([place])    Get started on new SSRI      Relevant Medications   escitalopram (LEXAPRO) 10 MG tablet    Other Visit Diagnoses    Needs flu shot       Relevant Orders   Flu vaccine HIGH DOSE PF (Fluzone High dose) (Completed)       Follow up plan: Return in about 3 weeks (around 10/02/2016) for follow-up.  An after-visit summary was printed and given to the patient at [place].  Please see the patient instructions which may contain other information and recommendations beyond what is mentioned above in the assessment and plan.  Meds ordered this encounter  Medications  . DISCONTD: escitalopram (LEXAPRO) 10 MG tablet    Sig: Take 1 tablet (10 mg total) by mouth daily.    Dispense:  30 tablet    Refill:  0  . escitalopram (LEXAPRO) 10 MG tablet    Sig: Take 1 tablet (10 mg total) by mouth daily.    Dispense:  30 tablet    Refill:  0    Orders Placed This Encounter  Procedures  . Flu vaccine HIGH DOSE PF (Fluzone High dose)  . Lipid panel  . CBC with Differential/Platelet  . COMPLETE METABOLIC PANEL WITH GFR  . Ambulatory referral to ENT

## 2016-09-11 NOTE — Assessment & Plan Note (Signed)
Controlled without medicine, avoid triggers

## 2016-09-11 NOTE — Assessment & Plan Note (Signed)
Encouraged pt to quit; she's trying to cut down; I'm here to help if needed

## 2016-09-11 NOTE — Assessment & Plan Note (Signed)
Avoid NSAIDs, stay hydrated 

## 2016-09-11 NOTE — Assessment & Plan Note (Signed)
Start back on bisphosphonate; three servings of calcium a day; vit D3 take 1,000 iu day

## 2016-09-11 NOTE — Patient Instructions (Addendum)
Start escitalopram for anxiety and depression Return in 3-4 weeks for follow-up Call sooner if needed You received the flu shot today; it should protect you against the flu virus over the coming months; it will take about two weeks for antibodies to develop; do try to stay away from hospitals, nursing homes, and daycares during peak flu season; taking extra vitamin C daily during flu season may help you avoid getting sick

## 2016-10-03 ENCOUNTER — Encounter: Payer: Self-pay | Admitting: Family Medicine

## 2016-10-03 ENCOUNTER — Ambulatory Visit (INDEPENDENT_AMBULATORY_CARE_PROVIDER_SITE_OTHER): Payer: Medicare HMO | Admitting: Family Medicine

## 2016-10-03 VITALS — BP 152/84 | HR 69 | Temp 97.9°F | Resp 16 | Wt 107.5 lb

## 2016-10-03 DIAGNOSIS — Z5181 Encounter for therapeutic drug level monitoring: Secondary | ICD-10-CM | POA: Diagnosis not present

## 2016-10-03 DIAGNOSIS — F3341 Major depressive disorder, recurrent, in partial remission: Secondary | ICD-10-CM

## 2016-10-03 DIAGNOSIS — Z1231 Encounter for screening mammogram for malignant neoplasm of breast: Secondary | ICD-10-CM

## 2016-10-03 DIAGNOSIS — I25119 Atherosclerotic heart disease of native coronary artery with unspecified angina pectoris: Secondary | ICD-10-CM | POA: Insufficient documentation

## 2016-10-03 DIAGNOSIS — Z23 Encounter for immunization: Secondary | ICD-10-CM

## 2016-10-03 DIAGNOSIS — I700 Atherosclerosis of aorta: Secondary | ICD-10-CM

## 2016-10-03 DIAGNOSIS — Z1239 Encounter for other screening for malignant neoplasm of breast: Secondary | ICD-10-CM

## 2016-10-03 DIAGNOSIS — E785 Hyperlipidemia, unspecified: Secondary | ICD-10-CM

## 2016-10-03 DIAGNOSIS — R918 Other nonspecific abnormal finding of lung field: Secondary | ICD-10-CM | POA: Diagnosis not present

## 2016-10-03 DIAGNOSIS — I2510 Atherosclerotic heart disease of native coronary artery without angina pectoris: Secondary | ICD-10-CM

## 2016-10-03 DIAGNOSIS — R634 Abnormal weight loss: Secondary | ICD-10-CM

## 2016-10-03 DIAGNOSIS — J432 Centrilobular emphysema: Secondary | ICD-10-CM | POA: Diagnosis not present

## 2016-10-03 HISTORY — DX: Centrilobular emphysema: J43.2

## 2016-10-03 HISTORY — DX: Atherosclerosis of aorta: I70.0

## 2016-10-03 MED ORDER — LOVASTATIN 40 MG PO TABS: 40.0000 mg | ORAL_TABLET | Freq: Every day | ORAL | 0 refills | Status: DC

## 2016-10-03 MED ORDER — ESCITALOPRAM OXALATE 10 MG PO TABS: 10.0000 mg | ORAL_TABLET | Freq: Every day | ORAL | 3 refills | Status: DC

## 2016-10-03 NOTE — Assessment & Plan Note (Signed)
Check sgpt in 6 weeks as she resumes statin

## 2016-10-03 NOTE — Assessment & Plan Note (Signed)
Improved on the SSRI; if headaches or nausea persist, we can change SSRI

## 2016-10-03 NOTE — Assessment & Plan Note (Signed)
Followed by  Dr. Genevive Bi, [place] surgeon

## 2016-10-03 NOTE — Progress Notes (Signed)
BP (!) 152/84   Pulse 69   Temp 97.9 F (36.6 C) (Oral)   Resp 16   Wt 107 lb 8 oz (48.8 kg)   SpO2 99%   BMI 18.45 kg/m    Subjective:    Patient ID: Robin Jones, female    DOB: July 18, 1951, 66 y.o.   MRN: [id number]  HPI: Robin Jones is a 66 y.o. female  Chief Complaint  Patient presents with  . Follow-up    3 week    Patient is here for follow-up; taking lexapro, started Feb 5th; no mania or hypomania; mood is better; appetite is pretty good; sleeping better; had a migraine yesterday; used to get migraines real bad; hadn't had one in a while; laid in bed and now back bothering her some; BP up a little up today; taking medicine again now  Taking vitamin D; energy level is pretty good, picking up  Flu shot UTD, got it at last visit  High cholesterol; she quit taking medicine in August and labs in Feb showed LDL under 100; she'd rather not take the medicine if she doesn't need it  COPD; has breathing medicine at home but not using it; no limitations in her daily activities; can keep up with two year-old running around  Depression screen [facility] 2/9 10/03/2016 09/11/2016 03/10/2016 12/09/2015 03/03/2015  Decreased Interest 0 1 0 0 0  Down, Depressed, Hopeless 0 1 0 1 0  PHQ - 2 Score 0 2 0 1 0  Altered sleeping - 1 - - -  Tired, decreased energy - 1 - - -  Change in appetite - 1 - - -  Feeling bad or failure about yourself  - 1 - - -  Trouble concentrating - 0 - - -  Moving slowly or fidgety/restless - 1 - - -  Suicidal thoughts - 0 - - -  PHQ-9 Score - 7 - - -  Difficult doing work/chores - Not difficult at all - - -   Relevant past medical, surgical, family and social history reviewed Past Medical History:  Diagnosis Date  . Aortic atherosclerosis ([place]) 10/03/2016  . Centrilobular emphysema ([place]) 10/03/2016   Noted on CT scan 2017  . Chronic kidney disease   . CKD (chronic kidney disease) stage 2, GFR 60-89 ml/min 03/03/2015  . Constipation   . Depression   .  Depression, major, recurrent, in partial remission ([place]) 03/03/2015  . GERD (gastroesophageal reflux disease)   . Hearing loss of both ears   . Hyperlipidemia   . Insomnia 12/05/2015  . Osteopenia 12/09/2015  . Personal history of tobacco use, presenting hazards to health 03/23/2015  . Skin cancer of face 2013   Left side of face resected.    Family History  Problem Relation Age of Onset  . Cancer Sister     liver  . Thyroid disease Father   . Heart disease Mother   . Hepatitis C Sister   . Thyroid disease Sister   . Cancer Brother 55    in muscle of leg  . Heart disease Brother   . Arthritis Brother   . COPD Sister   . Hypertension Sister   . Hyperlipidemia Sister   . Diabetes Maternal Grandmother   . Kidney disease Maternal Grandmother   . Arthritis Brother    Social History  Substance Use Topics  . Smoking status: Current Every Day Smoker    Packs/day: 1.00    Years: 45.00    Types: Cigarettes  .  Smokeless tobacco: Never Used  . Alcohol use No   Interim medical history since last visit reviewed. Allergies and medications reviewed  Review of Systems Per HPI unless specifically indicated above     Objective:    BP (!) 152/84   Pulse 69   Temp 97.9 F (36.6 C) (Oral)   Resp 16   Wt 107 lb 8 oz (48.8 kg)   SpO2 99%   BMI 18.45 kg/m   Wt Readings from Last 3 Encounters:  10/03/16 107 lb 8 oz (48.8 kg)  09/11/16 112 lb 8 oz (51 kg)  05/19/16 116 lb (52.6 kg)    Physical Exam  Constitutional: She appears well-developed and well-nourished.  Weight loss noted  Cardiovascular: Normal rate and regular rhythm.   Pulmonary/Chest: Effort normal and breath sounds normal.  Neurological: She is alert. She displays no tremor.  No tics  Psychiatric: Her mood appears not anxious. She does not exhibit a depressed mood.  Improved mood relative to last visit   Results for orders placed or performed in visit on 09/11/16  Lipid panel  Result Value Ref Range    Cholesterol 172 <200 mg/dL   Triglycerides 151 (H) <150 mg/dL   HDL 46 (L) >50 mg/dL   Total CHOL/HDL Ratio 3.7 <5.0 Ratio   VLDL 30 <30 mg/dL   LDL Cholesterol 96 <100 mg/dL  CBC with Differential/Platelet  Result Value Ref Range   WBC 5.7 3.8 - 10.8 K/uL   RBC 3.96 3.80 - 5.10 MIL/uL   Hemoglobin 11.9 11.7 - 15.5 g/dL   HCT 35.9 35.0 - 45.0 %   MCV 90.7 80.0 - 100.0 fL   MCH 30.1 27.0 - 33.0 pg   MCHC 33.1 32.0 - 36.0 g/dL   RDW 13.7 11.0 - 15.0 %   Platelets 192 140 - 400 K/uL   MPV 10.2 7.5 - 12.5 fL   Neutro Abs 3,192 1,500 - 7,800 cells/uL   Lymphs Abs 1,995 850 - 3,900 cells/uL   Monocytes Absolute 342 200 - 950 cells/uL   Eosinophils Absolute 114 15 - 500 cells/uL   Basophils Absolute 57 0 - 200 cells/uL   Neutrophils Relative % 56 %   Lymphocytes Relative 35 %   Monocytes Relative 6 %   Eosinophils Relative 2 %   Basophils Relative 1 %   Smear Review Criteria for review not met   COMPLETE METABOLIC PANEL WITH GFR  Result Value Ref Range   Sodium 142 135 - 146 mmol/L   Potassium 3.7 3.5 - 5.3 mmol/L   Chloride 106 98 - 110 mmol/L   CO2 32 (H) 20 - 31 mmol/L   Glucose, Bld 70 65 - 99 mg/dL   BUN 14 7 - 25 mg/dL   Creat 0.88 0.50 - 0.99 mg/dL   Total Bilirubin 0.5 0.2 - 1.2 mg/dL   Alkaline Phosphatase 78 33 - 130 U/L   AST 11 10 - 35 U/L   ALT 5 (L) 6 - 29 U/L   Total Protein 6.4 6.1 - 8.1 g/dL   Albumin 4.0 3.6 - 5.1 g/dL   Calcium 9.5 8.6 - 10.4 mg/dL   GFR, Est African American 78 >=60 mL/min   GFR, Est Non African American 68 >=60 mL/min      Assessment & Plan:   Problem List Items Addressed This Visit      Cardiovascular and Mediastinum   Coronary artery disease    Start back on statin; CAD noted on chest CT done by  specialist; goal LDL now less than 70      Relevant Medications   aspirin EC 81 MG tablet   lovastatin (MEVACOR) 40 MG tablet   Aortic atherosclerosis (HCC)    New goal LDL is than 70; start back on statin, aspirin 81 mg daily;  recheck cholesterol fasting in 6 weeks      Relevant Medications   aspirin EC 81 MG tablet   lovastatin (MEVACOR) 40 MG tablet     Respiratory   Centrilobular emphysema (HCC)    Patient denies limitations to activities; she would rather not use medicine; she unfortunately still smokes        Other   Need for pneumococcal vaccination   Relevant Orders   Pneumococcal polysaccharide vaccine 23-valent greater than or equal to 2yo subcutaneous/IM (Completed)   Medication monitoring encounter    Check sgpt in 6 weeks as she resumes statin      Relevant Orders   ALT   Lung mass    Followed by  Dr. Genevive Bi, [place] surgeon      Hyperlipidemia LDL goal <100    Initially, we were going to have patient stay off of the statin; however, after she left, I reviewed the CT scan ordered by Dr. Genevive Bi which documents aortic atherosclerosis and coronary artery disease; start back on statin, detailed message left on her voicemail; recheck lipids in 6 weeks; goal LDL less than 70      Relevant Medications   aspirin EC 81 MG tablet   lovastatin (MEVACOR) 40 MG tablet   Other Relevant Orders   Lipid panel   Depression, major, recurrent, in partial remission ([place]) - Primary    Improved on the SSRI; if headaches or nausea persist, we can change SSRI      Relevant Medications   escitalopram (LEXAPRO) 10 MG tablet    Other Visit Diagnoses    Screening for breast cancer       Relevant Orders   MM Digital Screening   Weight loss       may be related to grief, decreased PO intake; however, will check thyroid function; she will f/u with CT surgeon about lung nodules   Relevant Orders   TSH       Follow up plan: Return in about 3 months (around 12/31/2016) for follow-up.  An after-visit summary was printed and given to the patient at [place].  Please see the patient instructions which may contain other information and recommendations beyond what is mentioned above in the assessment and plan.  Meds  ordered this encounter  Medications  . cholecalciferol (VITAMIN D) 1000 units tablet    Sig: Take 1,000 Units by mouth daily.  Marland Kitchen escitalopram (LEXAPRO) 10 MG tablet    Sig: Take 1 tablet (10 mg total) by mouth daily.    Dispense:  90 tablet    Refill:  3  . aspirin EC 81 MG tablet    Sig: Take 1 tablet (81 mg total) by mouth daily.  Marland Kitchen lovastatin (MEVACOR) 40 MG tablet    Sig: Take 1 tablet (40 mg total) by mouth at bedtime.    Dispense:  90 tablet    Refill:  0    Orders Placed This Encounter  Procedures  . MM Digital Screening  . Pneumococcal polysaccharide vaccine 23-valent greater than or equal to 2yo subcutaneous/IM  . TSH  . Lipid panel  . ALT    Addendum: CT scan reviewed after patient left; I personally called to discuss the  atherosclerosis on the scan; start back on statin, recheck lipids in 6 weeks; start aspirin as well

## 2016-10-03 NOTE — Patient Instructions (Addendum)
Continue your medicine for mood I'm here if you need me Please do call to schedule your mammogram; the number to schedule one at either [facility] or [facility] is [phone]

## 2016-10-03 NOTE — Assessment & Plan Note (Signed)
Start back on statin; CAD noted on chest CT done by specialist; goal LDL now less than 70

## 2016-10-03 NOTE — Assessment & Plan Note (Signed)
Patient denies limitations to activities; she would rather not use medicine; she unfortunately still smokes

## 2016-10-03 NOTE — Assessment & Plan Note (Signed)
Initially, we were going to have patient stay off of the statin; however, after she left, I reviewed the CT scan ordered by Dr. Genevive Bi which documents aortic atherosclerosis and coronary artery disease; start back on statin, detailed message left on her voicemail; recheck lipids in 6 weeks; goal LDL less than 70

## 2016-10-03 NOTE — Assessment & Plan Note (Signed)
New goal LDL is than 70; start back on statin, aspirin 81 mg daily; recheck cholesterol fasting in 6 weeks

## 2016-10-09 DIAGNOSIS — H906 Mixed conductive and sensorineural hearing loss, bilateral: Secondary | ICD-10-CM | POA: Diagnosis not present

## 2016-10-09 DIAGNOSIS — E042 Nontoxic multinodular goiter: Secondary | ICD-10-CM | POA: Diagnosis not present

## 2016-10-18 ENCOUNTER — Other Ambulatory Visit: Payer: Self-pay | Admitting: Family Medicine

## 2016-10-18 ENCOUNTER — Other Ambulatory Visit: Payer: Self-pay

## 2016-10-18 MED ORDER — LOVASTATIN 40 MG PO TABS: 40.0000 mg | ORAL_TABLET | Freq: Every day | ORAL | 0 refills | Status: DC

## 2016-10-18 NOTE — Telephone Encounter (Signed)
Pt requesting a refill on Lovastatin to be sent to [facility].

## 2016-10-18 NOTE — Telephone Encounter (Signed)
Needs sent to mail order

## 2016-11-10 ENCOUNTER — Ambulatory Visit
Admission: RE | Admit: 2016-11-10 | Discharge: 2016-11-10 | Disposition: A | Discharge status: Home / Self Care | Payer: Medicare HMO | Source: Ambulatory Visit | Attending: Cardiothoracic Surgery | Admitting: Cardiothoracic Surgery

## 2016-11-10 DIAGNOSIS — J439 Emphysema, unspecified: Secondary | ICD-10-CM | POA: Diagnosis not present

## 2016-11-10 DIAGNOSIS — I700 Atherosclerosis of aorta: Secondary | ICD-10-CM | POA: Diagnosis not present

## 2016-11-10 DIAGNOSIS — R918 Other nonspecific abnormal finding of lung field: Secondary | ICD-10-CM | POA: Insufficient documentation

## 2016-11-15 ENCOUNTER — Ambulatory Visit (INDEPENDENT_AMBULATORY_CARE_PROVIDER_SITE_OTHER): Payer: Medicare HMO | Admitting: Cardiothoracic Surgery

## 2016-11-15 ENCOUNTER — Encounter: Payer: Self-pay | Admitting: Cardiothoracic Surgery

## 2016-11-15 VITALS — BP 112/64 | HR 71 | Temp 98.1°F | Wt 117.0 lb

## 2016-11-15 DIAGNOSIS — R911 Solitary pulmonary nodule: Secondary | ICD-10-CM

## 2016-11-15 NOTE — Patient Instructions (Signed)
Please go and see your primary care doctor on your scheduled visit.

## 2016-11-15 NOTE — Progress Notes (Signed)
  Patient ID: Robin Jones, female   DOB: 1950-09-07, 66 y.o.   MRN: [id number]  HISTORY: She returns today in follow-up. We have been following her for a dominant right upper lobe nodule and calcified mediastinal lymphadenopathy. She has no specific complaints today. She does not complain of any shortness of breath. Does state that she continues to smoke at least a half a pack to one pack cigarettes per day. She's had some difficult time socially and this has led to increased tobacco use.   Vitals:   11/15/16 1005  BP: 112/64  Pulse: 71  Temp: 98.1 F (36.7 C)     EXAM:    Resp: Lungs are clear bilaterally.  No respiratory distress, normal effort. Heart:  Regular without murmurs Neurological: Alert and oriented to person, place, and time. Coordination normal.  Skin: Skin is warm and dry. No rash noted. No diaphoretic. No erythema. No pallor.  Psychiatric: Normal mood and affect. Normal behavior. Judgment and thought content normal.    ASSESSMENT: I have independently reviewed the patient's CT scan.  Compared to a scan dated in 2016. There is been no change in the pulmonary nodules over the course of 2 years. This is most likely secondary to prior granulomatous inflammation in the lungs. There is some new scattered infiltrates center most consistent with inflammation within the lungs. These are mostly in the right middle lobe.   PLAN:   I again encouraged her to consider tobacco cessation. She states that her depression leads to increased tobacco use. I told her to discuss her issues with her primary care physician Dr. Sanda Klein who may be able to provide some relief. We did not make a return visit for as these nodules have been now stable for 2 years    Nestor Lewandowsky, MD

## 2016-11-17 ENCOUNTER — Ambulatory Visit: Payer: Commercial Managed Care - HMO

## 2016-11-17 ENCOUNTER — Ambulatory Visit: Payer: Self-pay | Admitting: Cardiothoracic Surgery

## 2016-11-23 ENCOUNTER — Ambulatory Visit
Admission: RE | Admit: 2016-11-23 | Discharge: 2016-11-23 | Disposition: A | Discharge status: Home / Self Care | Payer: Commercial Managed Care - HMO | Source: Ambulatory Visit | Attending: Family Medicine | Admitting: Family Medicine

## 2016-11-23 DIAGNOSIS — Z1239 Encounter for other screening for malignant neoplasm of breast: Secondary | ICD-10-CM

## 2016-11-23 DIAGNOSIS — Z1231 Encounter for screening mammogram for malignant neoplasm of breast: Secondary | ICD-10-CM | POA: Diagnosis not present

## 2017-01-02 ENCOUNTER — Ambulatory Visit: Payer: Medicare HMO | Admitting: Family Medicine

## 2017-04-03 ENCOUNTER — Telehealth: Payer: Self-pay | Admitting: Family Medicine

## 2017-04-03 NOTE — Telephone Encounter (Signed)
Going through old orders Patient was due for follow-up appointment in May Please call her Ask her to have fasting labs done and follow-up with me after to go over results I've extended the orders for one more month Thank you

## 2017-04-05 NOTE — Telephone Encounter (Signed)
Left detailed voicemial 

## 2017-05-07 ENCOUNTER — Encounter: Payer: Self-pay | Admitting: Family Medicine

## 2017-05-07 ENCOUNTER — Ambulatory Visit (INDEPENDENT_AMBULATORY_CARE_PROVIDER_SITE_OTHER): Payer: Medicare HMO | Admitting: Family Medicine

## 2017-05-07 VITALS — BP 110/62 | HR 84 | Temp 97.9°F | Resp 16 | Ht 64.0 in | Wt 105.1 lb

## 2017-05-07 DIAGNOSIS — F3341 Major depressive disorder, recurrent, in partial remission: Secondary | ICD-10-CM

## 2017-05-07 DIAGNOSIS — R911 Solitary pulmonary nodule: Secondary | ICD-10-CM

## 2017-05-07 DIAGNOSIS — J432 Centrilobular emphysema: Secondary | ICD-10-CM | POA: Diagnosis not present

## 2017-05-07 DIAGNOSIS — I700 Atherosclerosis of aorta: Secondary | ICD-10-CM

## 2017-05-07 DIAGNOSIS — R636 Underweight: Secondary | ICD-10-CM | POA: Diagnosis not present

## 2017-05-07 DIAGNOSIS — N182 Chronic kidney disease, stage 2 (mild): Secondary | ICD-10-CM | POA: Diagnosis not present

## 2017-05-07 DIAGNOSIS — Z23 Encounter for immunization: Secondary | ICD-10-CM

## 2017-05-07 DIAGNOSIS — E785 Hyperlipidemia, unspecified: Secondary | ICD-10-CM | POA: Diagnosis not present

## 2017-05-07 DIAGNOSIS — Z5181 Encounter for therapeutic drug level monitoring: Secondary | ICD-10-CM

## 2017-05-07 DIAGNOSIS — S8011XA Contusion of right lower leg, initial encounter: Secondary | ICD-10-CM

## 2017-05-07 LAB — CBC WITH DIFFERENTIAL/PLATELET
BASOS PCT: 1 %
Basophils Absolute: 53 cells/uL (ref 0–200)
Eosinophils Absolute: 80 cells/uL (ref 15–500)
Eosinophils Relative: 1.5 %
HCT: 36.3 % (ref 35.0–45.0)
Hemoglobin: 12.1 g/dL (ref 11.7–15.5)
Lymphs Abs: 1701 cells/uL (ref 850–3900)
MCH: 30 pg (ref 27.0–33.0)
MCHC: 33.3 g/dL (ref 32.0–36.0)
MCV: 90.1 fL (ref 80.0–100.0)
MPV: 10.8 fL (ref 7.5–12.5)
Monocytes Relative: 5.5 %
NEUTROS ABS: 3175 {cells}/uL (ref 1500–7800)
Neutrophils Relative %: 59.9 %
PLATELETS: 195 10*3/uL (ref 140–400)
RBC: 4.03 10*6/uL (ref 3.80–5.10)
RDW: 12.7 % (ref 11.0–15.0)
TOTAL LYMPHOCYTE: 32.1 %
WBC: 5.3 10*3/uL (ref 3.8–10.8)
WBCMIX: 292 {cells}/uL (ref 200–950)

## 2017-05-07 LAB — COMPLETE METABOLIC PANEL WITH GFR
AG Ratio: 2.1 (calc) (ref 1.0–2.5)
ALKALINE PHOSPHATASE (APISO): 82 U/L (ref 33–130)
ALT: 6 U/L (ref 6–29)
AST: 13 U/L (ref 10–35)
Albumin: 4.4 g/dL (ref 3.6–5.1)
BUN: 12 mg/dL (ref 7–25)
CO2: 29 mmol/L (ref 20–32)
CREATININE: 0.92 mg/dL (ref 0.50–0.99)
Calcium: 9.5 mg/dL (ref 8.6–10.4)
Chloride: 106 mmol/L (ref 98–110)
GFR, Est African American: 74 mL/min/{1.73_m2} (ref >=60)
GFR, Est Non African American: 64 mL/min/{1.73_m2} (ref >=60)
GLOBULIN: 2.1 g/dL (ref 1.9–3.7)
GLUCOSE: 85 mg/dL (ref 65–99)
Potassium: 3.8 mmol/L (ref 3.5–5.3)
SODIUM: 141 mmol/L (ref 135–146)
Total Bilirubin: 0.8 mg/dL (ref 0.2–1.2)
Total Protein: 6.5 g/dL (ref 6.1–8.1)

## 2017-05-07 LAB — LIPID PANEL
CHOLESTEROL: 134 mg/dL (ref <200)
HDL: 57 mg/dL (ref >50)
LDL CHOLESTEROL (CALC): 60 mg/dL
Non-HDL Cholesterol (Calc): 77 mg/dL (calc) (ref <130)
TRIGLYCERIDES: 89 mg/dL (ref <150)
Total CHOL/HDL Ratio: 2.4 (calc) (ref <5.0)

## 2017-05-07 LAB — TSH: TSH: 0.35 m[IU]/L — AB (ref 0.40–4.50)

## 2017-05-07 MED ORDER — TIOTROPIUM BROMIDE MONOHYDRATE 2.5 MCG/ACT IN AERS: 2.0000 | INHALATION_SPRAY | Freq: Every day | RESPIRATORY_TRACT | 11 refills | Status: DC

## 2017-05-07 NOTE — Assessment & Plan Note (Signed)
Stable, continue medicine 

## 2017-05-07 NOTE — Assessment & Plan Note (Signed)
Start inhaler

## 2017-05-07 NOTE — Assessment & Plan Note (Addendum)
Managed by Dr. Genevive Bi, cardiothoracic surgeon previously; turned over to me; will get chest CT in April 2019

## 2017-05-07 NOTE — Assessment & Plan Note (Signed)
Check lipids, avoid saturated fats

## 2017-05-07 NOTE — Assessment & Plan Note (Signed)
Check labs today.

## 2017-05-07 NOTE — Progress Notes (Signed)
BP 110/62 (BP Location: Left Arm, Patient Position: Sitting, Cuff Size: Small)   Pulse 84   Temp 97.9 F (36.6 C) (Oral)   Resp 16   Ht _0  (1.626 m)   Wt 105 lb 1.6 oz (47.7 kg)   SpO2 97%   BMI 18.04 kg/m    Subjective:    Patient ID: Robin Jones, female    DOB: [birth year], 66 y.o.   MRN: [id number]  HPI: Robin Jones is a 66 y.o. female  Chief Complaint  Patient presents with  . Bleeding/Bruising    bruising on pt right leg. Apperead wenesday. No pain but has a knot   . Medication Refill  . Follow-up    HPI Patient is here for f/u She is having bruising on the right leg; no pain; there is a knot; no pain with walking, no known injury; no hx of DVTs Her weight fluctuates; she just doesn't get hungry; fine just eating one meal a day; can drink her coofee and then go hours and hours until she actually eats something; she has been buying meal replacements with vitamins, drinking one a day, likes chocolate milk; she doesn't think she's eating 500 cal a day A little cough, but nothing to speak of; more allergies than anything Was seeing Dr. Genevive Bi, last CT done April 2018 and he said he was turning her over to me; his note from 11/15/16 reviewed No fevers IMPRESSION: 1. Stable right apical nodule, again felt to be the sequela of prior granulomatous disease given adjacent pleuroparenchymal scarring, other signs of prior granulomatous disease and stability from 2016. 2. There is new patchy right middle lobe tree-in-bud nodularity, likely inflammatory. 3. Underlying emphysema and atherosclerosis of aorta and coronary arteries again noted.   Electronically Signed   By: Robin Jones M.D.   On: 11/10/2016 10:45  No blood in urine or stool COPD, emphysema; not using any inhalers; thinks she needs some; not much shortness of breath; a little winded going up stairs; still smoking, about 1 ppd, sometimes more or less; 5 cigs a day when on her trip and she felt fine;  encouraged her to cut back Reviewed last labs High cholesterol; forgets to take at night; I gave her my blessing to take in the morning if needed  Depression screen [facility] 2/9 05/07/2017 10/03/2016 09/11/2016 03/10/2016 12/09/2015  Decreased Interest 0 0 1 0 0  Down, Depressed, Hopeless 0 0 1 0 1  PHQ - 2 Score 0 0 2 0 1  Altered sleeping - - 1 - -  Tired, decreased energy - - 1 - -  Change in appetite - - 1 - -  Feeling bad or failure about yourself  - - 1 - -  Trouble concentrating - - 0 - -  Moving slowly or fidgety/restless - - 1 - -  Suicidal thoughts - - 0 - -  PHQ-9 Score - - 7 - -  Difficult doing work/chores - - Not difficult at all - -    Relevant past medical, surgical, family and social history reviewed Past Medical History:  Diagnosis Date  . Aortic atherosclerosis ([place]) 10/03/2016  . Centrilobular emphysema ([place]) 10/03/2016   Noted on CT scan 2017  . Chronic kidney disease   . CKD (chronic kidney disease) stage 2, GFR 60-89 ml/min 03/03/2015  . Constipation   . Depression   . Depression, major, recurrent, in partial remission ([place]) 03/03/2015  . GERD (gastroesophageal reflux disease)   . Hearing  loss of both ears   . Hyperlipidemia   . Insomnia 12/05/2015  . Osteopenia 12/09/2015  . Personal history of tobacco use, presenting hazards to health 03/23/2015  . Skin cancer of face 2013   Left side of face resected.    Past Surgical History:  Procedure Laterality Date  . ABDOMINAL HYSTERECTOMY  1980   partial  . BACK SURGERY  1978  . COLONOSCOPY N/A 12/22/2014   Procedure: COLONOSCOPY;  Surgeon: Lucilla Lame, MD;  Location: [facility] ENDOSCOPY;  Service: Endoscopy;  Laterality: N/A;  . ELECTROMAGNETIC NAVIGATION BROCHOSCOPY N/A 04/15/2015   Procedure: ELECTROMAGNETIC NAVIGATION BRONCHOSCOPY;  Surgeon: Flora Lipps, MD;  Location: [facility] ORS;  Service: Cardiopulmonary;  Laterality: N/A;  . ENDOBRONCHIAL ULTRASOUND N/A 04/15/2015   Procedure: ENDOBRONCHIAL ULTRASOUND;  Surgeon: Flora Lipps,  MD;  Location: [facility] ORS;  Service: Cardiopulmonary;  Laterality: N/A;  . ENDOBRONCHIAL ULTRASOUND N/A 06/15/2015   Procedure: ENDOBRONCHIAL ULTRASOUND;  Surgeon: Flora Lipps, MD;  Location: [facility] ORS;  Service: Cardiopulmonary;  Laterality: N/A;  . INNER EAR SURGERY Left 2013  . TONSILLECTOMY  13   Family History  Problem Relation Age of Onset  . Cancer Sister        liver  . Thyroid disease Father   . Heart disease Mother   . Hepatitis C Sister   . Thyroid disease Sister   . Cancer Brother 55       in muscle of leg  . Heart disease Brother   . Arthritis Brother   . COPD Sister   . Hypertension Sister   . Hyperlipidemia Sister   . Diabetes Maternal Grandmother   . Kidney disease Maternal Grandmother   . Arthritis Brother   . Breast cancer Maternal Aunt   . Breast cancer Maternal Aunt    Social History   Social History  . Marital status: Divorced    Spouse name: N/A  . Number of children: N/A  . Years of education: N/A   Occupational History  . Not on file.   Social History Main Topics  . Smoking status: Current Every Day Smoker    Packs/day: 1.00    Years: 45.00    Types: Cigarettes  . Smokeless tobacco: Never Used  . Alcohol use No  . Drug use: No  . Sexual activity: No   Other Topics Concern  . Not on file   Social History Narrative  . No narrative on file    Interim medical history since last visit reviewed. Allergies and medications reviewed  Review of Systems Per HPI unless specifically indicated above     Objective:    BP 110/62 (BP Location: Left Arm, Patient Position: Sitting, Cuff Size: Small)   Pulse 84   Temp 97.9 F (36.6 C) (Oral)   Resp 16   Ht _0  (1.626 m)   Wt 105 lb 1.6 oz (47.7 kg)   SpO2 97%   BMI 18.04 kg/m   Wt Readings from Last 3 Encounters:  05/07/17 105 lb 1.6 oz (47.7 kg)  11/15/16 117 lb (53.1 kg)  10/03/16 107 lb 8 oz (48.8 kg)    Physical Exam  Constitutional: She appears well-developed and well-nourished.    Weight loss noted, discussed; does not appear cachectic  Cardiovascular: Normal rate and regular rhythm.   Pulmonary/Chest: Effort normal and breath sounds normal.  Neurological: She is alert. She displays no tremor.  No tics  Skin:  Hematoma on anteromedial RIGHT leg; no redness, no swelling of the right leg; no increased warmth;  unable to elicit a Bevelyn Buckles' sign on the right  Psychiatric: Her mood appears not anxious. Her affect is not blunt and not labile. Her speech is not rapid and/or pressured. She is not slowed. Cognition and memory are not impaired. She does not exhibit a depressed mood.    Results for orders placed or performed in visit on 09/11/16  Lipid panel  Result Value Ref Range   Cholesterol 172 <200 mg/dL   Triglycerides 151 (H) <150 mg/dL   HDL 46 (L) >50 mg/dL   Total CHOL/HDL Ratio 3.7 <5.0 Ratio   VLDL 30 <30 mg/dL   LDL Cholesterol 96 <100 mg/dL  CBC with Differential/Platelet  Result Value Ref Range   WBC 5.7 3.8 - 10.8 K/uL   RBC 3.96 3.80 - 5.10 MIL/uL   Hemoglobin 11.9 11.7 - 15.5 g/dL   HCT 35.9 35.0 - 45.0 %   MCV 90.7 80.0 - 100.0 fL   MCH 30.1 27.0 - 33.0 pg   MCHC 33.1 32.0 - 36.0 g/dL   RDW 13.7 11.0 - 15.0 %   Platelets 192 140 - 400 K/uL   MPV 10.2 7.5 - 12.5 fL   Neutro Abs 3,192 1,500 - 7,800 cells/uL   Lymphs Abs 1,995 850 - 3,900 cells/uL   Monocytes Absolute 342 200 - 950 cells/uL   Eosinophils Absolute 114 15 - 500 cells/uL   Basophils Absolute 57 0 - 200 cells/uL   Neutrophils Relative % 56 %   Lymphocytes Relative 35 %   Monocytes Relative 6 %   Eosinophils Relative 2 %   Basophils Relative 1 %   Smear Review Criteria for review not met   COMPLETE METABOLIC PANEL WITH GFR  Result Value Ref Range   Sodium 142 135 - 146 mmol/L   Potassium 3.7 3.5 - 5.3 mmol/L   Chloride 106 98 - 110 mmol/L   CO2 32 (H) 20 - 31 mmol/L   Glucose, Bld 70 65 - 99 mg/dL   BUN 14 7 - 25 mg/dL   Creat 0.88 0.50 - 0.99 mg/dL   Total Bilirubin 0.5 0.2  - 1.2 mg/dL   Alkaline Phosphatase 78 33 - 130 U/L   AST 11 10 - 35 U/L   ALT 5 (L) 6 - 29 U/L   Total Protein 6.4 6.1 - 8.1 g/dL   Albumin 4.0 3.6 - 5.1 g/dL   Calcium 9.5 8.6 - 10.4 mg/dL   GFR, Est African American 78 >=60 mL/min   GFR, Est Non African American 68 >=60 mL/min      Assessment & Plan:   Problem List Items Addressed This Visit      Cardiovascular and Mediastinum   Aortic atherosclerosis (HCC)    Goal LDL less than 70; check lipids, encouraged compliance with statin        Respiratory   Centrilobular emphysema (HCC) - Primary    Start inhaler      Relevant Medications   Tiotropium Bromide Monohydrate (SPIRIVA RESPIMAT) 2.5 MCG/ACT AERS     Genitourinary   CKD (chronic kidney disease) stage 2, GFR 60-89 ml/min    Check kidney function today; avoid NSAIDs        Other   Medication monitoring encounter    Check labs today      Relevant Orders   COMPLETE METABOLIC PANEL WITH GFR   Lung nodule seen on imaging study    Managed by Dr. Genevive Bi, cardiothoracic surgeon previously; turned over to me; will get chest CT in April  2019      Hyperlipidemia LDL goal <70    Check lipids, avoid saturated fats      Relevant Orders   Lipid panel   Depression, major, recurrent, in partial remission ([place])    Stable, continue medicine       Other Visit Diagnoses    Needs flu shot       Relevant Orders   Flu vaccine HIGH DOSE PF (Fluzone High dose) (Completed)   Underweight       discussed sufficient calorie intake; will see her back in six weeks; if not gaining weight, then further work-up; check TSH today   Relevant Orders   COMPLETE METABOLIC PANEL WITH GFR   CBC with Differential/Platelet   TSH   Hematoma of leg, right, initial encounter       nothing to raise suspicion of a DVT; advised pt to watch leg, reasons given to go to ER, but should resolve on its own       Follow up plan: Return in about 6 weeks (around 06/18/2017) for follow-up visit with  Dr. Sanda Klein.  An after-visit summary was printed and given to the patient at [place].  Please see the patient instructions which may contain other information and recommendations beyond what is mentioned above in the assessment and plan.  Meds ordered this encounter  Medications  . Tiotropium Bromide Monohydrate (SPIRIVA RESPIMAT) 2.5 MCG/ACT AERS    Sig: Inhale 2 puffs into the lungs daily.    Dispense:  1 Inhaler    Refill:  11    Orders Placed This Encounter  Procedures  . Flu vaccine HIGH DOSE PF (Fluzone High dose)  . COMPLETE METABOLIC PANEL WITH GFR  . CBC with Differential/Platelet  . TSH  . Lipid panel

## 2017-05-07 NOTE — Patient Instructions (Addendum)
I do encourage you to quit smoking Call [phone] to sign up for smoking cessation classes You can call 1-800-QUIT-NOW to talk with a smoking cessation coach Keep an eye on the leg; if it gets swollen or red or hot to the touch or it hurts to walk, then go right to an emergency department Please do add a 250 to 300 calorie snack in the morning to help you put on some weight

## 2017-05-07 NOTE — Assessment & Plan Note (Signed)
Check kidney function today; avoid NSAIDs

## 2017-05-07 NOTE — Assessment & Plan Note (Signed)
Goal LDL less than 70; check lipids, encouraged compliance with statin

## 2017-05-08 ENCOUNTER — Other Ambulatory Visit: Payer: Self-pay | Admitting: Family Medicine

## 2017-05-08 DIAGNOSIS — E0590 Thyrotoxicosis, unspecified without thyrotoxic crisis or storm: Secondary | ICD-10-CM | POA: Insufficient documentation

## 2017-05-08 DIAGNOSIS — R636 Underweight: Secondary | ICD-10-CM | POA: Insufficient documentation

## 2017-05-08 NOTE — Progress Notes (Signed)
Referral, add on lab

## 2017-05-17 IMAGING — CR DG ABDOMEN 2V
1 series · 2 of 2 positions shown · non-contrast
Comparison: None.

CLINICAL DATA: Abdominal pain.  Nausea and vomiting.

EXAM:
ABDOMEN - 2 VIEW

[Series 1: dg abd 2 views · 0.14mm/px · 2 of 2 slices shown]
[im 1/2]
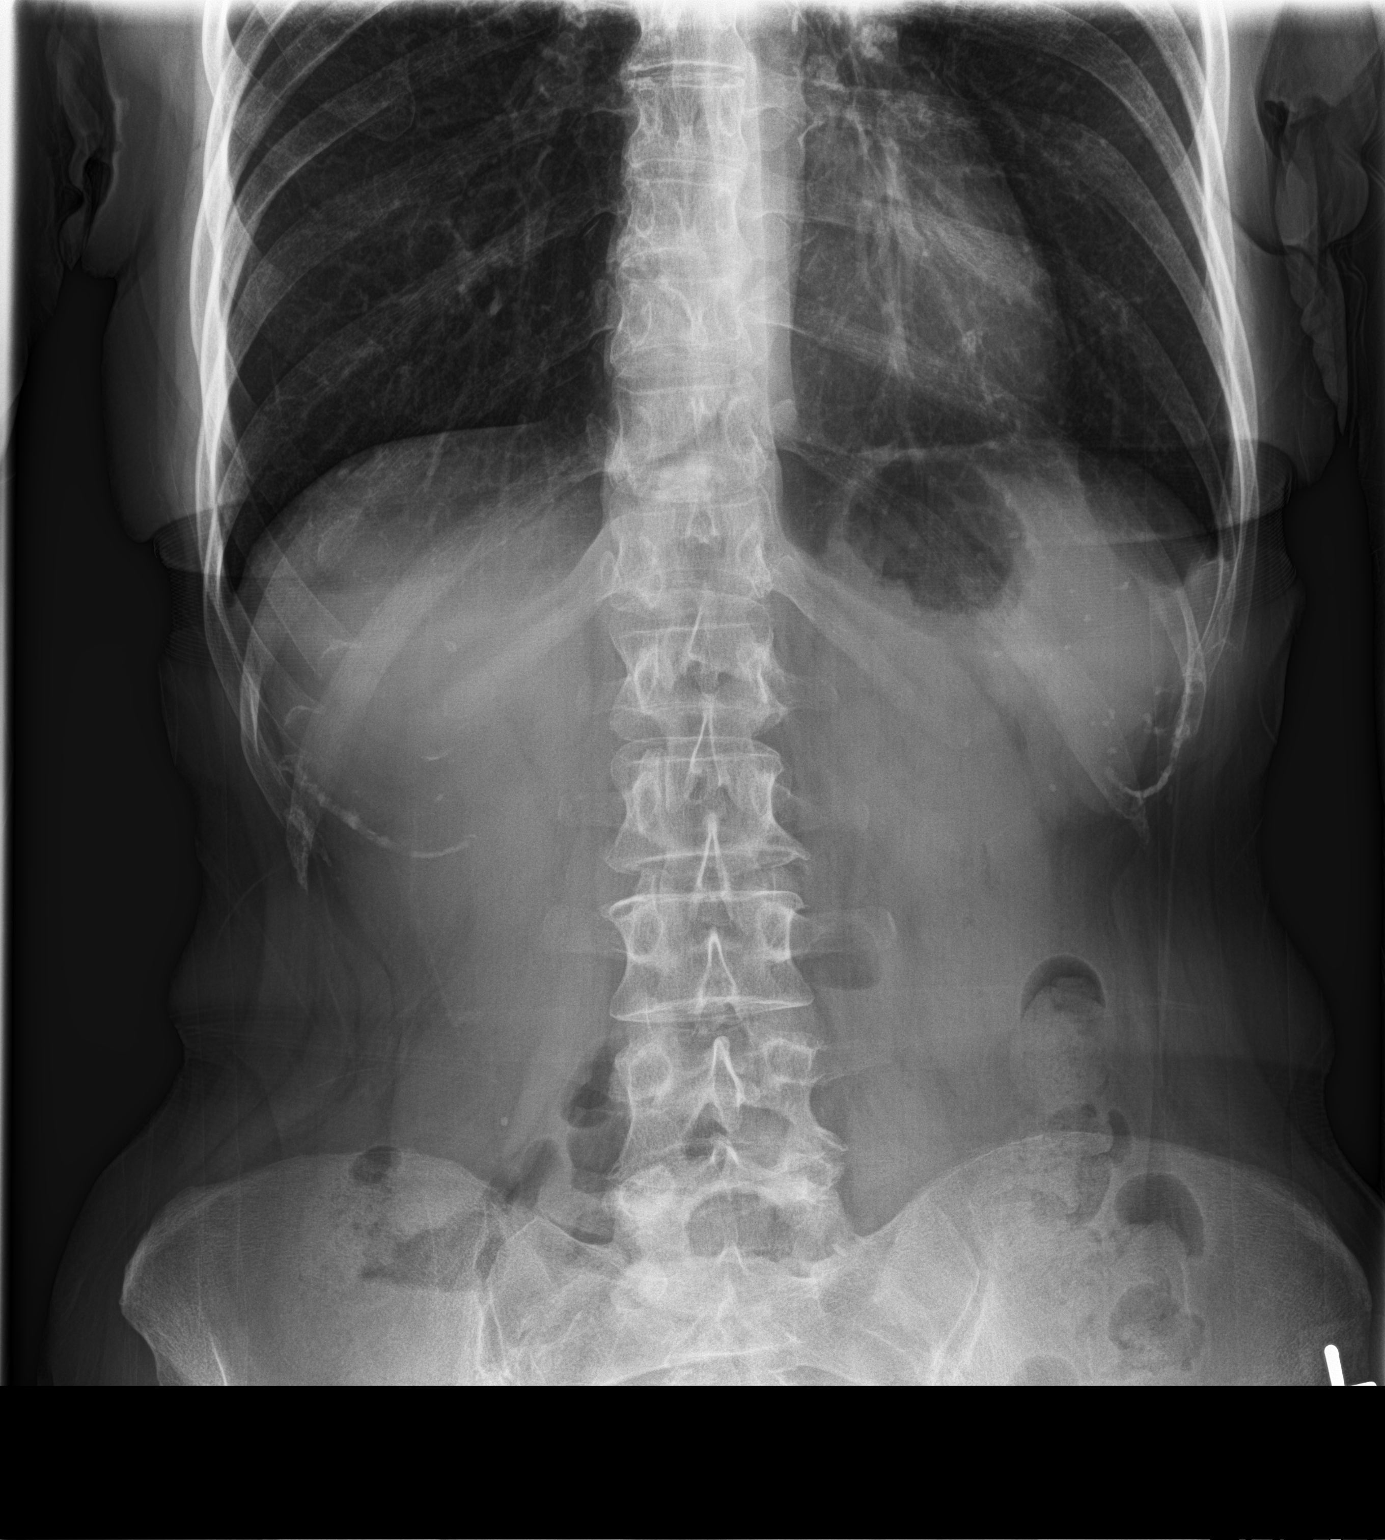
[im 2/2]
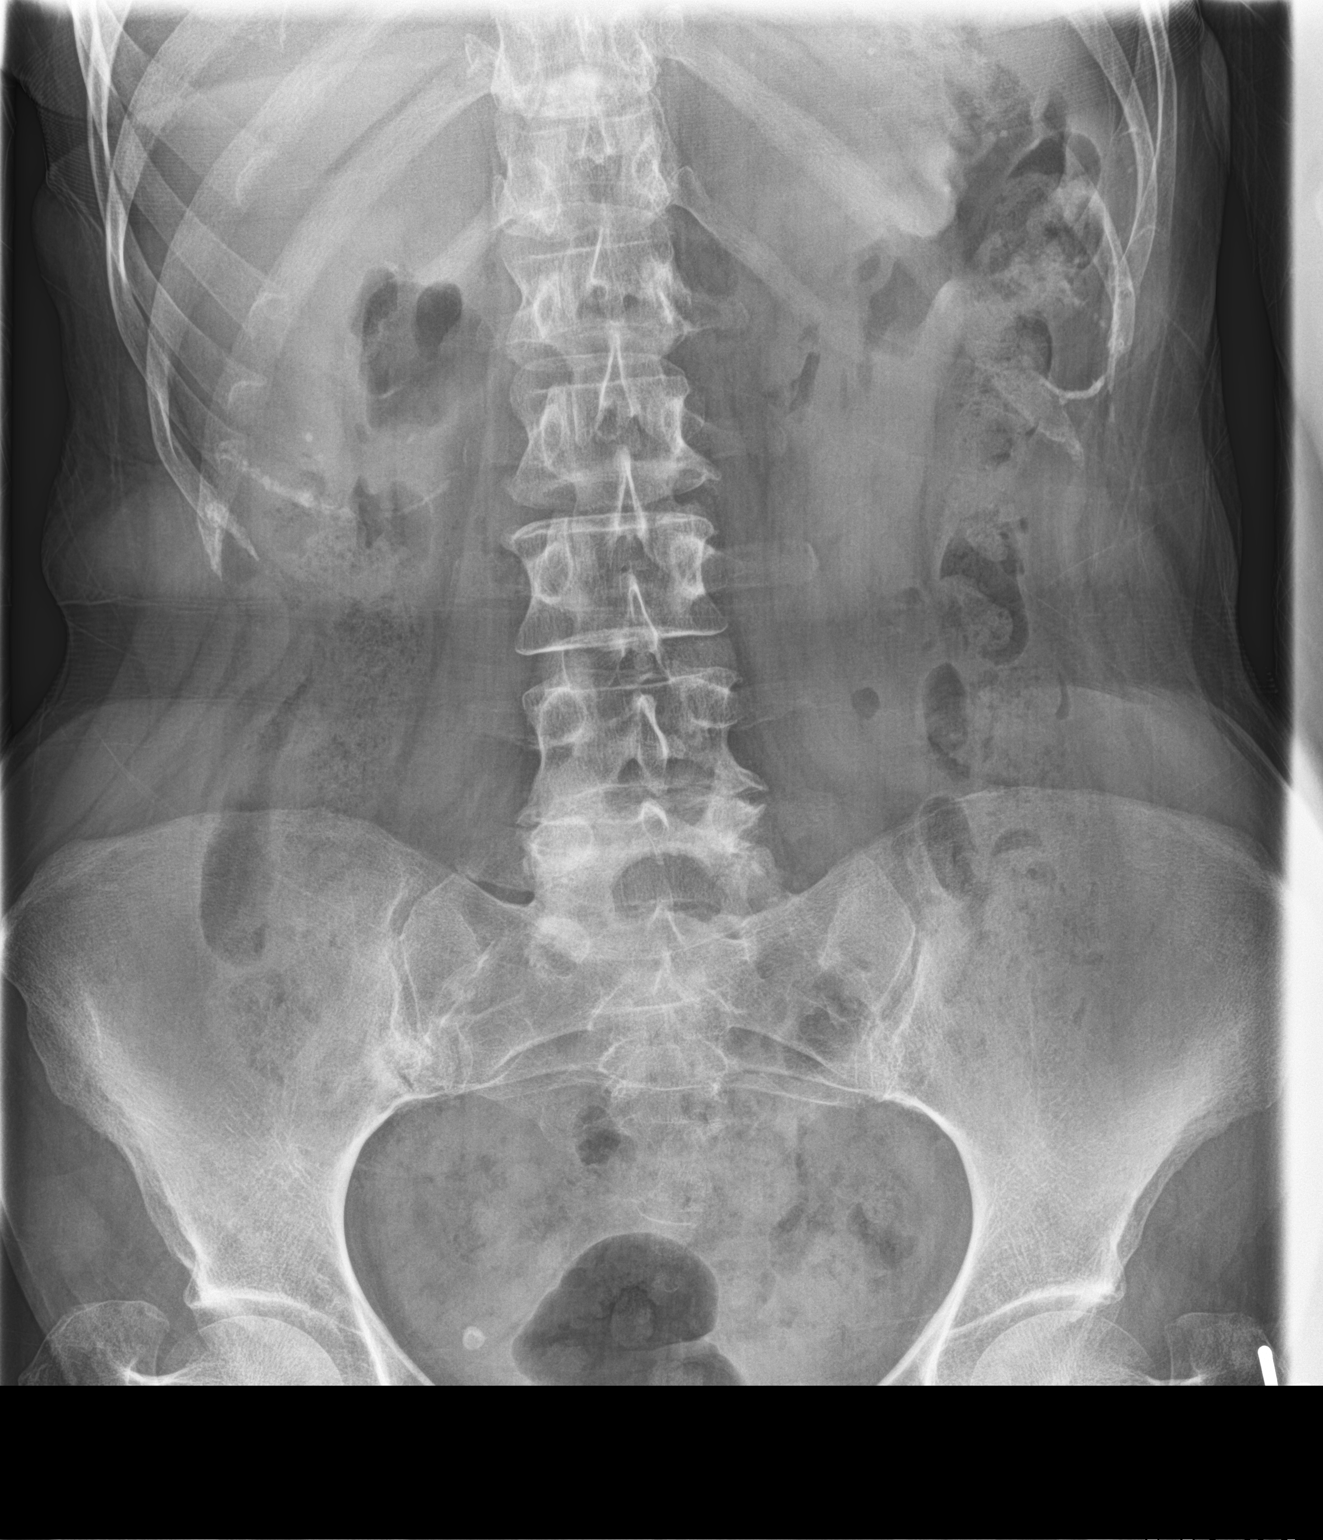

[2 of 2 positions shown; findings below may reference images not displayed]

FINDINGS: Soft tissue structures are unremarkable. Pelvic calcifications
consistent phleboliths.

Prominent amount of stool noted throughout the colon consistent with
constipation. No bowel distention. No free air.
IMPRESSION: Prominent amount of stool in colon consistent with constipation.

## 2017-06-06 ENCOUNTER — Ambulatory Visit: Payer: Medicare HMO | Admitting: Endocrinology

## 2017-06-06 DIAGNOSIS — Z0289 Encounter for other administrative examinations: Secondary | ICD-10-CM

## 2017-06-15 ENCOUNTER — Telehealth: Payer: Self-pay | Admitting: Family Medicine

## 2017-06-15 NOTE — Telephone Encounter (Signed)
I never received the results from the patient's free T4; this was supposed to have been added on to blood drawn in early October Please find out if they have result Also, please check on endocrinology referral for this thyroid issue Thank you

## 2017-06-18 ENCOUNTER — Encounter: Payer: Self-pay | Admitting: Family Medicine

## 2017-06-18 ENCOUNTER — Ambulatory Visit (INDEPENDENT_AMBULATORY_CARE_PROVIDER_SITE_OTHER): Payer: Medicare HMO | Admitting: Family Medicine

## 2017-06-18 DIAGNOSIS — E0590 Thyrotoxicosis, unspecified without thyrotoxic crisis or storm: Secondary | ICD-10-CM

## 2017-06-18 DIAGNOSIS — M8589 Other specified disorders of bone density and structure, multiple sites: Secondary | ICD-10-CM | POA: Diagnosis not present

## 2017-06-18 DIAGNOSIS — J432 Centrilobular emphysema: Secondary | ICD-10-CM | POA: Diagnosis not present

## 2017-06-18 DIAGNOSIS — E041 Nontoxic single thyroid nodule: Secondary | ICD-10-CM | POA: Diagnosis not present

## 2017-06-18 LAB — T4, FREE: Free T4: 1 ng/dL (ref 0.8–1.8)

## 2017-06-18 LAB — TSH: TSH: 1.13 mIU/L (ref 0.40–4.50)

## 2017-06-18 MED ORDER — LOVASTATIN 40 MG PO TABS: 40.0000 mg | ORAL_TABLET | Freq: Every day | ORAL | 0 refills | Status: DC

## 2017-06-18 MED ORDER — ESCITALOPRAM OXALATE 10 MG PO TABS: 10.0000 mg | ORAL_TABLET | Freq: Every day | ORAL | 3 refills | Status: DC

## 2017-06-18 MED ORDER — TIOTROPIUM BROMIDE MONOHYDRATE 2.5 MCG/ACT IN AERS: 2.0000 | INHALATION_SPRAY | Freq: Every day | RESPIRATORY_TRACT | 3 refills | Status: DC

## 2017-06-18 NOTE — Assessment & Plan Note (Signed)
Monitored by ENT; yearly [place]

## 2017-06-18 NOTE — Assessment & Plan Note (Signed)
Hyperthyroidism can accelerate bone loss; check thyroid hormone today

## 2017-06-18 NOTE — Patient Instructions (Signed)
Continue your medicines We'll check labs today If you have not heard anything from my staff in a week about any orders/referrals/studies from today, please contact us here to follow-up [phone] If you lose more weight, please call me

## 2017-06-18 NOTE — Progress Notes (Signed)
BP (!) 142/80   Pulse 68   Temp 97.8 F (36.6 C) (Oral)   Resp 16   Wt 107 lb 12.8 oz (48.9 kg)   BMI 18.50 kg/m    Subjective:    Patient ID: Robin Jones, female    DOB: [birth year], 66 y.o.   MRN: [id number]  HPI: Robin Jones is a 66 y.o. female  Chief Complaint  Patient presents with  . Follow-up  . Medication Refill    HPI Patient is here for f/u; no medical exctiement since last visit She missed her appointment for her thyroid Feeling a little jittery; lost weight between April and last visit, but has gained back two of those pounds; having loose stools; more hair loss; skin feeling dry; using lotions She has a thyroid nodule on the left; watched by ENT, scanned it and it hadn't grown over the last two years; still needs to be followed; had biopsy; Dr. Richardson Landry Osteopenia Arthritis; bothered in her knee; both sides High cholesterol; on statin; needs refills Emphysema; started inhaler last visit; doing fine; breathing a little easier  Depression screen [facility] 2/9 06/18/2017 05/07/2017 10/03/2016 09/11/2016 03/10/2016  Decreased Interest 0 0 0 1 0  Down, Depressed, Hopeless 1 0 0 1 0  PHQ - 2 Score 1 0 0 2 0  Altered sleeping - - - 1 -  Tired, decreased energy - - - 1 -  Change in appetite - - - 1 -  Feeling bad or failure about yourself  - - - 1 -  Trouble concentrating - - - 0 -  Moving slowly or fidgety/restless - - - 1 -  Suicidal thoughts - - - 0 -  PHQ-9 Score - - - 7 -  Difficult doing work/chores - - - Not difficult at all -    Relevant past medical, surgical, family and social history reviewed Past Medical History:  Diagnosis Date  . Aortic atherosclerosis ([place]) 10/03/2016  . Centrilobular emphysema ([place]) 10/03/2016   Noted on CT scan 2017  . Chronic kidney disease   . CKD (chronic kidney disease) stage 2, GFR 60-89 ml/min 03/03/2015  . Constipation   . Depression   . Depression, major, recurrent, in partial remission ([place]) 03/03/2015  . GERD  (gastroesophageal reflux disease)   . Hearing loss of both ears   . Hyperlipidemia   . Insomnia 12/05/2015  . Osteopenia 12/09/2015  . Personal history of tobacco use, presenting hazards to health 03/23/2015  . Skin cancer of face 2013   Left side of face resected.    Past Surgical History:  Procedure Laterality Date  . ABDOMINAL HYSTERECTOMY  1980   partial  . BACK SURGERY  1978  . INNER EAR SURGERY Left 2013  . TONSILLECTOMY  13   Family History  Problem Relation Age of Onset  . Cancer Sister        liver  . Thyroid disease Father   . Heart disease Mother   . Hepatitis C Sister   . Thyroid disease Sister   . Cancer Brother 55       in muscle of leg  . Heart disease Brother   . Arthritis Brother   . COPD Sister   . Hypertension Sister   . Hyperlipidemia Sister   . Diabetes Maternal Grandmother   . Kidney disease Maternal Grandmother   . Arthritis Brother   . Breast cancer Maternal Aunt   . Breast cancer Maternal Aunt    Social History  Socioeconomic History  . Marital status: Divorced    Spouse name: Not on file  . Number of children: Not on file  . Years of education: Not on file  . Highest education level: Not on file  Social Needs  . Financial resource strain: Not on file  . Food insecurity - worry: Not on file  . Food insecurity - inability: Not on file  . Transportation needs - medical: Not on file  . Transportation needs - non-medical: Not on file  Occupational History  . Not on file  Tobacco Use  . Smoking status: Current Every Day Smoker    Packs/day: 1.00    Years: 45.00    Pack years: 45.00    Types: Cigarettes  . Smokeless tobacco: Never Used  Substance and Sexual Activity  . Alcohol use: No    Alcohol/week: 0.0 oz  . Drug use: No  . Sexual activity: No  Other Topics Concern  . Not on file  Social History Narrative  . Not on file    Interim medical history since last visit reviewed. Allergies and medications reviewed  Review of  Systems Per HPI unless specifically indicated above     Objective:    BP (!) 142/80   Pulse 68   Temp 97.8 F (36.6 C) (Oral)   Resp 16   Wt 107 lb 12.8 oz (48.9 kg)   BMI 18.50 kg/m   Wt Readings from Last 3 Encounters:  06/18/17 107 lb 12.8 oz (48.9 kg)  05/07/17 105 lb 1.6 oz (47.7 kg)  11/15/16 117 lb (53.1 kg)    Physical Exam  Constitutional: She appears well-developed and well-nourished.  Weight gain noted  Neck: No thyroid mass and no thyromegaly present.  Cardiovascular: Normal rate and regular rhythm.  Pulmonary/Chest: Effort normal and breath sounds normal.  Abdominal: She exhibits no distension.  Musculoskeletal: She exhibits no edema.  Neurological: She is alert. She displays no tremor.  Reflex Scores:      Patellar reflexes are 2+ on the right side and 2+ on the left side. No tics; no hyperreflexia; no cross-adductor response  Psychiatric: Her mood appears not anxious. She does not exhibit a depressed mood.  Improved mood relative to last visit   Results for orders placed or performed in visit on 05/07/17  COMPLETE METABOLIC PANEL WITH GFR  Result Value Ref Range   Glucose, Bld 85 65 - 99 mg/dL   BUN 12 7 - 25 mg/dL   Creat 0.92 0.50 - 0.99 mg/dL   GFR, Est Non African American 64 > OR = 60 mL/min/1.32m2   GFR, Est African American 74 > OR = 60 mL/min/1.47m2   BUN/Creatinine Ratio NOT APPLICABLE 6 - 22 (calc)   Sodium 141 135 - 146 mmol/L   Potassium 3.8 3.5 - 5.3 mmol/L   Chloride 106 98 - 110 mmol/L   CO2 29 20 - 32 mmol/L   Calcium 9.5 8.6 - 10.4 mg/dL   Total Protein 6.5 6.1 - 8.1 g/dL   Albumin 4.4 3.6 - 5.1 g/dL   Globulin 2.1 1.9 - 3.7 g/dL (calc)   AG Ratio 2.1 1.0 - 2.5 (calc)   Total Bilirubin 0.8 0.2 - 1.2 mg/dL   Alkaline phosphatase (APISO) 82 33 - 130 U/L   AST 13 10 - 35 U/L   ALT 6 6 - 29 U/L  CBC with Differential/Platelet  Result Value Ref Range   WBC 5.3 3.8 - 10.8 Thousand/uL   RBC 4.03 3.80 - 5.10  Million/uL   Hemoglobin  12.1 11.7 - 15.5 g/dL   HCT 36.3 35.0 - 45.0 %   MCV 90.1 80.0 - 100.0 fL   MCH 30.0 27.0 - 33.0 pg   MCHC 33.3 32.0 - 36.0 g/dL   RDW 12.7 11.0 - 15.0 %   Platelets 195 140 - 400 Thousand/uL   MPV 10.8 7.5 - 12.5 fL   Neutro Abs 3,175 1,500 - 7,800 cells/uL   Lymphs Abs 1,701 850 - 3,900 cells/uL   WBC mixed population 292 200 - 950 cells/uL   Eosinophils Absolute 80 15 - 500 cells/uL   Basophils Absolute 53 0 - 200 cells/uL   Neutrophils Relative % 59.9 %   Total Lymphocyte 32.1 %   Monocytes Relative 5.5 %   Eosinophils Relative 1.5 %   Basophils Relative 1.0 %  TSH  Result Value Ref Range   TSH 0.35 (L) 0.40 - 4.50 mIU/L  Lipid panel  Result Value Ref Range   Cholesterol 134 <200 mg/dL   HDL 57 >50 mg/dL   Triglycerides 89 <150 mg/dL   LDL Cholesterol (Calc) 60 mg/dL (calc)   Total CHOL/HDL Ratio 2.4 <5.0 (calc)   Non-HDL Cholesterol (Calc) 77 <130 mg/dL (calc)      Assessment & Plan:   Problem List Items Addressed This Visit      Respiratory   Centrilobular emphysema (HCC)   Relevant Medications   loratadine (CLARITIN) 10 MG tablet   Tiotropium Bromide Monohydrate (SPIRIVA RESPIMAT) 2.5 MCG/ACT AERS     Endocrine   Thyroid nodule    Monitored by ENT; yearly [place]      Relevant Orders   TSH   T4, free   Hyperthyroidism    Recheck labs today and re-refer to endocrinologist if needed      Relevant Orders   TSH   T4, free     Musculoskeletal and Integument   Osteopenia    Hyperthyroidism can accelerate bone loss; check thyroid hormone today          Follow up plan: Return in about 6 months (around 12/16/2017) for twenty minute follow-up with fasting labs.  An after-visit summary was printed and given to the patient at [place].  Please see the patient instructions which may contain other information and recommendations beyond what is mentioned above in the assessment and plan.  Meds ordered this encounter  Medications  . loratadine (CLARITIN) 10  MG tablet    Sig: Take 10 mg daily by mouth.  . lovastatin (MEVACOR) 40 MG tablet    Sig: Take 1 tablet (40 mg total) at bedtime by mouth.    Dispense:  90 tablet    Refill:  0  . escitalopram (LEXAPRO) 10 MG tablet    Sig: Take 1 tablet (10 mg total) daily by mouth.    Dispense:  90 tablet    Refill:  3  . Tiotropium Bromide Monohydrate (SPIRIVA RESPIMAT) 2.5 MCG/ACT AERS    Sig: Inhale 2 puffs daily into the lungs.    Dispense:  3 Inhaler    Refill:  3    Orders Placed This Encounter  Procedures  . TSH  . T4, free

## 2017-06-18 NOTE — Telephone Encounter (Signed)
Latish checking on referral and you reorder the thyroid test already.

## 2017-06-18 NOTE — Assessment & Plan Note (Signed)
Recheck labs today and re-refer to endocrinologist if needed

## 2017-06-19 ENCOUNTER — Telehealth: Payer: Self-pay

## 2017-06-19 NOTE — Telephone Encounter (Signed)
Called pt no answer. LM for pt informing her of the information below. To call back for questions. CRM created.

## 2017-06-19 NOTE — Telephone Encounter (Signed)
-----   Message from Arnetha Courser, MD sent at 06/19/2017  1:42 PM EST ----- Please let pt know that her thyroid tests are back to normal; we'll hold on the endocrinology referral for now (please cancel if in progress); have her watch her weight, and if she starts to gain weight over the next several months, let's recheck it; thank you

## 2017-08-20 ENCOUNTER — Other Ambulatory Visit: Payer: Self-pay | Admitting: Family Medicine

## 2017-08-21 NOTE — Telephone Encounter (Signed)
Oct 2018 SGPT and lipids reviewed; Rx approved

## 2017-09-20 ENCOUNTER — Ambulatory Visit: Payer: Medicare HMO | Admitting: Family Medicine

## 2017-09-21 ENCOUNTER — Other Ambulatory Visit: Payer: Self-pay | Admitting: Otolaryngology

## 2017-09-21 DIAGNOSIS — E041 Nontoxic single thyroid nodule: Secondary | ICD-10-CM

## 2017-09-21 DIAGNOSIS — E042 Nontoxic multinodular goiter: Secondary | ICD-10-CM | POA: Diagnosis not present

## 2017-09-21 DIAGNOSIS — H906 Mixed conductive and sensorineural hearing loss, bilateral: Secondary | ICD-10-CM | POA: Diagnosis not present

## 2017-09-21 DIAGNOSIS — H9222 Otorrhagia, left ear: Secondary | ICD-10-CM | POA: Diagnosis not present

## 2017-10-01 ENCOUNTER — Encounter: Payer: Self-pay | Admitting: Family Medicine

## 2017-10-01 ENCOUNTER — Ambulatory Visit (INDEPENDENT_AMBULATORY_CARE_PROVIDER_SITE_OTHER): Payer: Medicare HMO | Admitting: Family Medicine

## 2017-10-01 VITALS — BP 116/78 | HR 78 | Temp 98.1°F | Wt 105.2 lb

## 2017-10-01 DIAGNOSIS — D692 Other nonthrombocytopenic purpura: Secondary | ICD-10-CM | POA: Diagnosis not present

## 2017-10-01 DIAGNOSIS — F418 Other specified anxiety disorders: Secondary | ICD-10-CM | POA: Diagnosis not present

## 2017-10-01 DIAGNOSIS — S8011XA Contusion of right lower leg, initial encounter: Secondary | ICD-10-CM

## 2017-10-01 MED ORDER — FLUTICASONE-UMECLIDIN-VILANT 100-62.5-25 MCG/INH IN AEPB: 1.0000 | INHALATION_SPRAY | Freq: Every day | RESPIRATORY_TRACT | 6 refills | Status: DC

## 2017-10-01 NOTE — Patient Instructions (Addendum)
Okay to come by tomorrow for just a fasting glucose check with the CMA Keep trying to cut back on your cigarettes Stop the spiriva and start the new breathing medicine (inhaler called Trelegy) You are due for your next chest CT on or after April 7th I do encourage you to quit smoking Call [phone] to sign up for smoking cessation classes You can call 1-800-QUIT-NOW to talk with a smoking cessation coach

## 2017-10-01 NOTE — Assessment & Plan Note (Addendum)
Checked last platelet count (normal); years of smoking and sun damage

## 2017-10-01 NOTE — Progress Notes (Signed)
BP 116/78 (BP Location: Left Arm, Patient Position: Sitting, Cuff Size: Normal)   Pulse 78   Temp 98.1 F (36.7 C) (Oral)   Wt 105 lb 3.2 oz (47.7 kg)   SpO2 99%   BMI 18.06 kg/m    Subjective:    Patient ID: Robin Jones, female    DOB: 1951/07/15, 67 y.o.   MRN: [id number]  HPI: Robin Jones is a 67 y.o. female  Chief Complaint  Patient presents with  . Rash    Itchy and red   . Nasal Congestion    HPI She has had sinus congestion for about 3 weeks ago; can feel it in her chest too with some wheezing; low grade fever; no rash; no sore throat  She hurt her right shin; hit a shelf that knocked over; hurt so bad; throbbing pains; over a week ago; red swollen place; wonders if she bruised the bone; gets easy bruises on the arms when scratching or bumping her arm  She wants to be checked for sugar; runs on both sides of the family; mother died at 33; her siblings got diabetes older in life; dry mouth, little blurred vision; not drinking soft drinks; sugar in the coffee; white bread, so not much  Depression screen [facility] 2/9 10/01/2017 06/18/2017 05/07/2017 10/03/2016 09/11/2016  Decreased Interest 0 0 0 0 1  Down, Depressed, Hopeless 0 1 0 0 1  PHQ - 2 Score 0 1 0 0 2  Altered sleeping - - - - 1  Tired, decreased energy - - - - 1  Change in appetite - - - - 1  Feeling bad or failure about yourself  - - - - 1  Trouble concentrating - - - - 0  Moving slowly or fidgety/restless - - - - 1  Suicidal thoughts - - - - 0  PHQ-9 Score - - - - 7  Difficult doing work/chores - - - - Not difficult at all    Relevant past medical, surgical, family and social history reviewed Past Medical History:  Diagnosis Date  . Aortic atherosclerosis ([place]) 10/03/2016  . Centrilobular emphysema ([place]) 10/03/2016   Noted on CT scan 2017  . Chronic kidney disease   . CKD (chronic kidney disease) stage 2, GFR 60-89 ml/min 03/03/2015  . Constipation   . Depression   . Depression, major, recurrent, in  partial remission ([place]) 03/03/2015  . GERD (gastroesophageal reflux disease)   . Hearing loss of both ears   . Hyperlipidemia   . Insomnia 12/05/2015  . Osteopenia 12/09/2015  . Personal history of tobacco use, presenting hazards to health 03/23/2015  . Skin cancer of face 2013   Left side of face resected.    Past Surgical History:  Procedure Laterality Date  . ABDOMINAL HYSTERECTOMY  1980   partial  . BACK SURGERY  1978  . COLONOSCOPY N/A 12/22/2014   Procedure: COLONOSCOPY;  Surgeon: Lucilla Lame, MD;  Location: [facility] ENDOSCOPY;  Service: Endoscopy;  Laterality: N/A;  . ELECTROMAGNETIC NAVIGATION BROCHOSCOPY N/A 04/15/2015   Procedure: ELECTROMAGNETIC NAVIGATION BRONCHOSCOPY;  Surgeon: Flora Lipps, MD;  Location: [facility] ORS;  Service: Cardiopulmonary;  Laterality: N/A;  . ENDOBRONCHIAL ULTRASOUND N/A 04/15/2015   Procedure: ENDOBRONCHIAL ULTRASOUND;  Surgeon: Flora Lipps, MD;  Location: [facility] ORS;  Service: Cardiopulmonary;  Laterality: N/A;  . ENDOBRONCHIAL ULTRASOUND N/A 06/15/2015   Procedure: ENDOBRONCHIAL ULTRASOUND;  Surgeon: Flora Lipps, MD;  Location: [facility] ORS;  Service: Cardiopulmonary;  Laterality: N/A;  . INNER EAR SURGERY Left  2013  . TONSILLECTOMY  13   Family History  Problem Relation Age of Onset  . Cancer Sister        liver  . Thyroid disease Father   . Heart disease Mother   . Hepatitis C Sister   . Thyroid disease Sister   . Cancer Brother 55       in muscle of leg  . Heart disease Brother   . Arthritis Brother   . COPD Sister   . Hypertension Sister   . Hyperlipidemia Sister   . Diabetes Maternal Grandmother   . Kidney disease Maternal Grandmother   . Arthritis Brother   . Breast cancer Maternal Aunt   . Breast cancer Maternal Aunt    Social History   Tobacco Use  . Smoking status: Current Every Day Smoker    Packs/day: 1.00    Years: 45.00    Pack years: 45.00    Types: Cigarettes  . Smokeless tobacco: Never Used  Substance Use Topics  . Alcohol use:  No    Alcohol/week: 0.0 oz  . Drug use: No    Interim medical history since last visit reviewed. Allergies and medications reviewed  Review of Systems  HENT: Negative for nosebleeds.   Respiratory: Positive for cough and wheezing.    Per HPI unless specifically indicated above     Objective:    BP 116/78 (BP Location: Left Arm, Patient Position: Sitting, Cuff Size: Normal)   Pulse 78   Temp 98.1 F (36.7 C) (Oral)   Wt 105 lb 3.2 oz (47.7 kg)   SpO2 99%   BMI 18.06 kg/m   Wt Readings from Last 3 Encounters:  10/01/17 105 lb 3.2 oz (47.7 kg)  06/18/17 107 lb 12.8 oz (48.9 kg)  05/07/17 105 lb 1.6 oz (47.7 kg)    Physical Exam  Constitutional: She appears well-developed and well-nourished. No distress.  HENT:  Head: Normocephalic and atraumatic.  Right Ear: Tympanic membrane is not erythematous. Decreased hearing is noted.  Left Ear: Tympanic membrane is not erythematous. Decreased hearing is noted.  Nose: Rhinorrhea and septal deviation present.  Mouth/Throat: No posterior oropharyngeal edema or posterior oropharyngeal erythema.  Hearing aides removed B  Eyes: EOM are normal. No scleral icterus.  Neck: No thyromegaly present.  Cardiovascular: Normal rate, regular rhythm and normal heart sounds.  No murmur heard. Pulmonary/Chest: Effort normal and breath sounds normal. No respiratory distress. She has no wheezes.  Abdominal: Soft. Bowel sounds are normal. She exhibits no distension.  Musculoskeletal: Normal range of motion. She exhibits no edema.  Lymphadenopathy:    She has no cervical adenopathy.  Neurological: She is alert. She exhibits normal muscle tone.  Skin: Skin is warm and dry. She is not diaphoretic. No pallor.     Right leg, hematoma anterior shin; left arm, ecchymoses  Psychiatric: She has a normal mood and affect. Her behavior is normal. Judgment and thought content normal.    Results for orders placed or performed in visit on 06/18/17  TSH    Result Value Ref Range   TSH 1.13 0.40 - 4.50 mIU/L  T4, free  Result Value Ref Range   Free T4 1.0 0.8 - 1.8 ng/dL      Assessment & Plan:   Problem List Items Addressed This Visit      Cardiovascular and Mediastinum   Senile purpura ([place])    Checked last platelet count (normal); years of smoking and sun damage       Other  Visit Diagnoses    Hematoma of leg, right, initial encounter    -  Primary   keep an eye on that; reassured her that trauma alone should not cause cancer   Anxiety about health       explained that I do not think the place on her leg where she hit it is cancer; we reviewed her previous glucose readings; no diabetes       Follow up plan: No Follow-up on file.  An after-visit summary was printed and given to the patient at [place].  Please see the patient instructions which may contain other information and recommendations beyond what is mentioned above in the assessment and plan.  Meds ordered this encounter  Medications  . Fluticasone-Umeclidin-Vilant (TRELEGY ELLIPTA) 100-62.5-25 MCG/INH AEPB    Sig: Inhale 1 puff into the lungs daily. (this replaces Spiriva plus new medicine)    Dispense:  60 each    Refill:  6    No orders of the defined types were placed in this encounter.

## 2017-10-09 ENCOUNTER — Other Ambulatory Visit: Payer: Self-pay

## 2017-10-09 DIAGNOSIS — Z833 Family history of diabetes mellitus: Secondary | ICD-10-CM

## 2017-10-25 ENCOUNTER — Other Ambulatory Visit: Payer: Self-pay | Admitting: Family Medicine

## 2017-10-25 DIAGNOSIS — Z1231 Encounter for screening mammogram for malignant neoplasm of breast: Secondary | ICD-10-CM

## 2017-11-06 ENCOUNTER — Telehealth: Payer: Self-pay | Admitting: *Deleted

## 2017-11-06 NOTE — Telephone Encounter (Signed)
Left message for patient to notify them that it is time to schedule annual low dose lung cancer screening CT scan. Instructed patient to call back to verify information prior to the scan being scheduled.  

## 2017-11-07 ENCOUNTER — Telehealth: Payer: Self-pay | Admitting: Family Medicine

## 2017-11-07 ENCOUNTER — Telehealth: Payer: Self-pay

## 2017-11-07 DIAGNOSIS — R911 Solitary pulmonary nodule: Secondary | ICD-10-CM

## 2017-11-07 DIAGNOSIS — Z720 Tobacco use: Secondary | ICD-10-CM

## 2017-11-07 DIAGNOSIS — F4321 Adjustment disorder with depressed mood: Secondary | ICD-10-CM

## 2017-11-07 DIAGNOSIS — R918 Other nonspecific abnormal finding of lung field: Secondary | ICD-10-CM

## 2017-11-07 MED ORDER — HYDROXYZINE HCL 25 MG PO TABS: 25.0000 mg | ORAL_TABLET | Freq: Three times a day (TID) | ORAL | 0 refills | Status: DC | PRN

## 2017-11-07 NOTE — Telephone Encounter (Signed)
I entered chest CT orders Due on or after November 12, 2017 Have patient call to schedule please Thank you

## 2017-11-07 NOTE — Telephone Encounter (Signed)
-----   Message from Arnetha Courser, MD sent at 10/01/2017  4:13 PM EST ----- Regarding: due for chest CT on or aftrer April 7th Order chest CT

## 2017-11-07 NOTE — Telephone Encounter (Signed)
New rx sent

## 2017-11-07 NOTE — Telephone Encounter (Signed)
Pt calls in, she is very tearful and upset. Pt states that her nephew has died suddenly and she is having a "hard time" coping with this. Pt would like "something for her nerves" to get her through travelling to [place] and the funeral. Informed pt that per Dr.Lada she would be willing to send in a medication, but it will not be a benzo. Pt gave verbal understanding.

## 2017-11-12 NOTE — Telephone Encounter (Signed)
Sending note again, please contact patient for schedule her own CT

## 2017-11-13 NOTE — Telephone Encounter (Signed)
Thank you :)

## 2017-11-13 NOTE — Telephone Encounter (Signed)
Pt has been out of town due to the death of her nephew (just returned home this week), she is dealing with a lot of grief, was holding message in my box for a few days to wait for a more appropriate time to call her.

## 2017-11-14 NOTE — Telephone Encounter (Signed)
Letter mailed

## 2017-11-21 ENCOUNTER — Ambulatory Visit
Admission: RE | Admit: 2017-11-21 | Discharge: 2017-11-21 | Disposition: A | Discharge status: Home / Self Care | Payer: Medicare HMO | Source: Ambulatory Visit | Attending: Otolaryngology | Admitting: Otolaryngology

## 2017-11-21 DIAGNOSIS — E041 Nontoxic single thyroid nodule: Secondary | ICD-10-CM

## 2017-11-21 DIAGNOSIS — E042 Nontoxic multinodular goiter: Secondary | ICD-10-CM | POA: Diagnosis not present

## 2017-11-22 ENCOUNTER — Telehealth: Payer: Self-pay | Admitting: *Deleted

## 2017-11-22 NOTE — Telephone Encounter (Signed)
Left message for patient to notify them that it is time to schedule annual low dose lung cancer screening CT scan. Instructed patient to call back to verify information prior to the scan being scheduled.  

## 2017-11-26 ENCOUNTER — Ambulatory Visit
Admission: RE | Admit: 2017-11-26 | Discharge: 2017-11-26 | Disposition: A | Discharge status: Home / Self Care | Payer: Medicare HMO | Source: Ambulatory Visit | Attending: Family Medicine | Admitting: Family Medicine

## 2017-11-26 DIAGNOSIS — Z1231 Encounter for screening mammogram for malignant neoplasm of breast: Secondary | ICD-10-CM | POA: Diagnosis not present

## 2017-11-28 ENCOUNTER — Ambulatory Visit
Admission: RE | Admit: 2017-11-28 | Discharge: 2017-11-28 | Disposition: A | Discharge status: Home / Self Care | Payer: Medicare HMO | Source: Ambulatory Visit | Attending: Family Medicine | Admitting: Family Medicine

## 2017-11-28 DIAGNOSIS — J439 Emphysema, unspecified: Secondary | ICD-10-CM | POA: Diagnosis not present

## 2017-11-28 DIAGNOSIS — E042 Nontoxic multinodular goiter: Secondary | ICD-10-CM | POA: Diagnosis not present

## 2017-11-28 DIAGNOSIS — I898 Other specified noninfective disorders of lymphatic vessels and lymph nodes: Secondary | ICD-10-CM | POA: Diagnosis not present

## 2017-11-28 DIAGNOSIS — Z720 Tobacco use: Secondary | ICD-10-CM

## 2017-11-28 DIAGNOSIS — I700 Atherosclerosis of aorta: Secondary | ICD-10-CM | POA: Insufficient documentation

## 2017-11-28 DIAGNOSIS — I2510 Atherosclerotic heart disease of native coronary artery without angina pectoris: Secondary | ICD-10-CM | POA: Diagnosis not present

## 2017-11-28 DIAGNOSIS — R911 Solitary pulmonary nodule: Secondary | ICD-10-CM

## 2017-11-28 DIAGNOSIS — R918 Other nonspecific abnormal finding of lung field: Secondary | ICD-10-CM

## 2017-11-29 ENCOUNTER — Encounter: Payer: Self-pay | Admitting: Family Medicine

## 2017-11-29 ENCOUNTER — Ambulatory Visit (INDEPENDENT_AMBULATORY_CARE_PROVIDER_SITE_OTHER): Payer: Medicare HMO | Admitting: Family Medicine

## 2017-11-29 VITALS — BP 124/74 | HR 88 | Temp 97.7°F | Ht 64.0 in | Wt 106.4 lb

## 2017-11-29 DIAGNOSIS — E041 Nontoxic single thyroid nodule: Secondary | ICD-10-CM

## 2017-11-29 DIAGNOSIS — I25119 Atherosclerotic heart disease of native coronary artery with unspecified angina pectoris: Secondary | ICD-10-CM | POA: Diagnosis not present

## 2017-11-29 DIAGNOSIS — R079 Chest pain, unspecified: Secondary | ICD-10-CM

## 2017-11-29 DIAGNOSIS — I700 Atherosclerosis of aorta: Secondary | ICD-10-CM | POA: Diagnosis not present

## 2017-11-29 DIAGNOSIS — R9389 Abnormal findings on diagnostic imaging of other specified body structures: Secondary | ICD-10-CM | POA: Diagnosis not present

## 2017-11-29 NOTE — Patient Instructions (Signed)
I do encourage you to quit smoking Call [phone] to sign up for smoking cessation classes You can call 1-800-QUIT-NOW to talk with a smoking cessation coach We'll have you see the heart and lung doctors

## 2017-11-29 NOTE — Assessment & Plan Note (Signed)
Managed by Dr. Richardson Landry
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
## 2017-11-29 NOTE — Assessment & Plan Note (Signed)
Reviewed findings on CT with her today; refer to cardiologist since she is having chest discomfort and palpitations

## 2017-11-29 NOTE — Assessment & Plan Note (Signed)
Goal LDL less than 70; discussed with patient, noted on chest CT

## 2017-12-06 DIAGNOSIS — E785 Hyperlipidemia, unspecified: Secondary | ICD-10-CM | POA: Diagnosis not present

## 2017-12-06 DIAGNOSIS — R0602 Shortness of breath: Secondary | ICD-10-CM | POA: Diagnosis not present

## 2017-12-06 DIAGNOSIS — Z720 Tobacco use: Secondary | ICD-10-CM | POA: Diagnosis not present

## 2017-12-06 DIAGNOSIS — I208 Other forms of angina pectoris: Secondary | ICD-10-CM | POA: Diagnosis not present

## 2017-12-06 DIAGNOSIS — R011 Cardiac murmur, unspecified: Secondary | ICD-10-CM | POA: Diagnosis not present

## 2018-01-22 ENCOUNTER — Ambulatory Visit (INDEPENDENT_AMBULATORY_CARE_PROVIDER_SITE_OTHER): Payer: Medicare HMO | Admitting: Nurse Practitioner

## 2018-01-22 ENCOUNTER — Ambulatory Visit
Admission: RE | Admit: 2018-01-22 | Discharge: 2018-01-22 | Disposition: A | Discharge status: Home / Self Care | Payer: Medicare HMO | Source: Ambulatory Visit | Attending: Nurse Practitioner | Admitting: Nurse Practitioner

## 2018-01-22 ENCOUNTER — Other Ambulatory Visit: Payer: Self-pay | Admitting: Nurse Practitioner

## 2018-01-22 ENCOUNTER — Encounter: Payer: Self-pay | Admitting: Nurse Practitioner

## 2018-01-22 VITALS — BP 108/72 | HR 102 | Temp 98.1°F | Resp 18 | Ht 64.0 in | Wt 100.9 lb

## 2018-01-22 DIAGNOSIS — R0602 Shortness of breath: Secondary | ICD-10-CM

## 2018-01-22 DIAGNOSIS — J449 Chronic obstructive pulmonary disease, unspecified: Secondary | ICD-10-CM | POA: Diagnosis not present

## 2018-01-22 DIAGNOSIS — R05 Cough: Secondary | ICD-10-CM

## 2018-01-22 DIAGNOSIS — R0689 Other abnormalities of breathing: Secondary | ICD-10-CM

## 2018-01-22 DIAGNOSIS — R918 Other nonspecific abnormal finding of lung field: Secondary | ICD-10-CM | POA: Diagnosis not present

## 2018-01-22 DIAGNOSIS — R059 Cough, unspecified: Secondary | ICD-10-CM

## 2018-01-22 DIAGNOSIS — I700 Atherosclerosis of aorta: Secondary | ICD-10-CM | POA: Insufficient documentation

## 2018-01-22 DIAGNOSIS — H6501 Acute serous otitis media, right ear: Secondary | ICD-10-CM | POA: Diagnosis not present

## 2018-01-22 MED ORDER — DOXYCYCLINE HYCLATE 100 MG PO TABS: 100.0000 mg | ORAL_TABLET | Freq: Two times a day (BID) | ORAL | 0 refills | Status: DC

## 2018-01-22 MED ORDER — ALBUTEROL SULFATE (2.5 MG/3ML) 0.083% IN NEBU: 2.5000 mg | INHALATION_SOLUTION | Freq: Once | RESPIRATORY_TRACT | Status: AC

## 2018-01-22 MED ORDER — AMOXICILLIN-POT CLAVULANATE 875-125 MG PO TABS: 1.0000 | ORAL_TABLET | Freq: Two times a day (BID) | ORAL | 0 refills | Status: DC

## 2018-01-22 MED ORDER — ALBUTEROL SULFATE HFA 108 (90 BASE) MCG/ACT IN AERS: 1.0000 | INHALATION_SPRAY | Freq: Four times a day (QID) | RESPIRATORY_TRACT | 2 refills | Status: DC | PRN

## 2018-01-22 NOTE — Patient Instructions (Signed)
-   please go across the street to get a chest xray now - Please pick up your antibiotic today and take your first dose tonight. Take it every 12 hours with some food until it is all gone even if you feel better before it is finished. Please do eat yogurt daily or take a probiotic daily for the next month or two We want to replace the healthy germs in the gut If you notice foul, watery diarrhea in the next two months, schedule an appointment RIGHT AWAY  - Please follow-up with [place] in 2 days to ensure you are improving - please call 911 if you are feeling worse or having chest pain or cant catch your breath - I have also prescribed you an albuterol inhaler you can take if you have chest tightness of wheezing. Take 1-2 puffs every 6 hours as needed.  - Please quit smoking.    Otitis Media, Adult Otitis media is redness, soreness, and puffiness (swelling) in the space just behind your eardrum (middle ear). It may be caused by allergies or infection. It often happens along with a cold. Follow these instructions at home:  Take your medicine as told. Finish it even if you start to feel better.  Only take over-the-counter or prescription medicines for pain, discomfort, or fever as told by your doctor.  Follow up with your doctor as told. Contact a doctor if:  You have otitis media only in one ear, or bleeding from your nose, or both.  You notice a lump on your neck.  You are not getting better in 3-5 days.  You feel worse instead of better. Get help right away if:  You have pain that is not helped with medicine.  You have puffiness, redness, or pain around your ear.  You get a stiff neck.  You cannot move part of your face (paralysis).  You notice that the bone behind your ear hurts when you touch it. This information is not intended to replace advice given to you by your health care provider. Make sure you discuss any questions you have with your health care provider. Document  Released: 01/10/2008 Document Revised: 12/30/2015 Document Reviewed: 02/18/2013 Elsevier Interactive Patient Education  2017 [employer].

## 2018-01-22 NOTE — Progress Notes (Addendum)
Name: Robin Jones   MRN: [id number]    DOB: 1951-01-11   Date:01/22/2018       Progress Note  Subjective  Chief Complaint  Chief Complaint  Patient presents with  . URI    cough (yellowish phelem) runny nose  . Ear Drainage    HPI  Patient endorses right ear pain, cough, congestion, shortness of breath ongoing for 5 days, progressively worsening. Has been taking alka-seltzer plus. Pt endorses subjective fever and chills.  Patient smokes 10 cigerettes  Day but the last few days has only been able to do 3-4. Pt uses spiriva every day. Patient sts has never felt like this before.   Patient Active Problem List   Diagnosis Date Noted  . Thyroid nodule 06/18/2017  . Underweight 05/08/2017  . Hyperthyroidism 05/08/2017  . Coronary artery disease 10/03/2016  . Aortic atherosclerosis ([place]) 10/03/2016  . Centrilobular emphysema ([place]) 10/03/2016  . HAV (hallux abducto valgus) 03/26/2016  . Bunion of right foot 03/10/2016  . Skin lesion of left leg 03/10/2016  . Senile purpura ([place]) 03/10/2016  . Need for hepatitis C screening test 02/09/2016  . Presbycusis of both ears 02/09/2016  . Osteopenia 12/09/2015  . Insomnia 12/05/2015  . Medication monitoring encounter 11/11/2015  . Adenopathy   . Lung mass   . Lung nodule seen on imaging study 03/29/2015  . Right flank pain 03/19/2015  . GERD without esophagitis 03/03/2015  . Depression, major, recurrent, in partial remission ([place]) 03/03/2015  . CKD (chronic kidney disease) stage 2, GFR 60-89 ml/min 03/03/2015  . COPD, mild ([place]) 03/03/2015  . Tobacco abuse 03/03/2015  . Chronic constipation 03/03/2015  . Osteoarthritis of multiple joints 03/03/2015  . At risk for falls 03/03/2015  . Thyroid mass of unclear etiology 03/03/2015  . Hyperlipidemia LDL goal <70 03/03/2015  . Need for pneumococcal vaccination 03/03/2015  . Polyp of colon, adenomatous 03/03/2015  . Skin lesion of face 03/03/2015    Past Medical History:  Diagnosis  Date  . Aortic atherosclerosis ([place]) 10/03/2016  . Centrilobular emphysema ([place]) 10/03/2016   Noted on CT scan 2017  . Chronic kidney disease   . CKD (chronic kidney disease) stage 2, GFR 60-89 ml/min 03/03/2015  . Constipation   . Depression   . Depression, major, recurrent, in partial remission ([place]) 03/03/2015  . GERD (gastroesophageal reflux disease)   . Hearing loss of both ears   . Hyperlipidemia   . Insomnia 12/05/2015  . Osteopenia 12/09/2015  . Personal history of tobacco use, presenting hazards to health 03/23/2015  . Skin cancer of face 2013   Left side of face resected.     Past Surgical History:  Procedure Laterality Date  . ABDOMINAL HYSTERECTOMY  1980   partial  . BACK SURGERY  1978  . COLONOSCOPY N/A 12/22/2014   Procedure: COLONOSCOPY;  Surgeon: Lucilla Lame, MD;  Location: [facility] ENDOSCOPY;  Service: Endoscopy;  Laterality: N/A;  . ELECTROMAGNETIC NAVIGATION BROCHOSCOPY N/A 04/15/2015   Procedure: ELECTROMAGNETIC NAVIGATION BRONCHOSCOPY;  Surgeon: Flora Lipps, MD;  Location: [facility];  Service: Cardiopulmonary;  Laterality: N/A;  . ENDOBRONCHIAL ULTRASOUND N/A 04/15/2015   Procedure: ENDOBRONCHIAL ULTRASOUND;  Surgeon: Flora Lipps, MD;  Location: [facility] OR[facility];  Service: Cardiopulmonary;  Laterality: N/A;  . ENDOBRONCHIAL ULTRASOUND N/A 06/15/2015   Procedure: ENDOBRONCHIAL ULTRASOUND;  Surgeon: Flora Lipps, MD;  Location: [facility] OR[facility];  Service: Cardiopulmonary;  Laterality: N/A;  . INNER EAR SURGERY Left 2013  . TONSILLECTOMY  13    Social History  Tobacco Use  . Smoking status: Current Every Day Smoker    Packs/day: 1.00    Years: 45.00    Pack years: 45.00    Types: Cigarettes  . Smokeless tobacco: Never Used  Substance Use Topics  . Alcohol use: No    Alcohol/week: 0.0 oz     Current Outpatient Medications:  .  acetaminophen (TYLENOL) 325 MG tablet, Take 650 mg by mouth every 6 (six) hours as needed for moderate pain or headache., Disp: , Rfl:  .  aspirin EC 81 MG  tablet, Take 1 tablet (81 mg total) by mouth daily., Disp: , Rfl:  .  cholecalciferol (VITAMIN D) 1000 units tablet, Take 1,000 Units by mouth daily., Disp: , Rfl:  .  escitalopram (LEXAPRO) 10 MG tablet, Take 1 tablet (10 mg total) daily by mouth., Disp: 90 tablet, Rfl: 3 .  hydrOXYzine (ATARAX/VISTARIL) 25 MG tablet, Take 1 tablet (25 mg total) by mouth 3 (three) times daily as needed., Disp: 15 tablet, Rfl: 0 .  loratadine (CLARITIN) 10 MG tablet, Take 10 mg daily by mouth., Disp: , Rfl:  .  lovastatin (MEVACOR) 40 MG tablet, Take 1 tablet (40 mg total) by mouth at bedtime., Disp: 90 tablet, Rfl: 2 .  Tiotropium Bromide Monohydrate (SPIRIVA RESPIMAT) 2.5 MCG/ACT AERS, Inhale 2 puffs daily into the lungs., Disp: 3 Inhaler, Rfl: 3  Allergies  Allergen Reactions  . Trelegy Ellipta [Fluticasone-Umeclidin-Vilant] Other (See Comments)    Pt states that this made her mouth and throat very sore, unable to tolerate     ROS  No other specific complaints in a complete review of systems (except as listed in HPI above).  Objective  Vitals:   01/22/18 1007  BP: 108/72  Pulse: (!) 102  Resp: 18  Temp: 98.1 F (36.7 C)  TempSrc: Oral  SpO2: 95%  Weight: 100 lb 14.4 oz (45.8 kg)  Height: 5\' 4"  (1.626 m)     Body mass index is 17.32 kg/m.  Nursing Note and Vital Signs reviewed.  Physical Exam  Constitutional: Patient appears thing and uncomfortable, able to speak full of sentences without issue, HOH.  HEENT: head atraumatic, normocephalic, pupils equal and reactive to light, bilateral ear bulging and erythema, right ear has serous drainage, pt has right maxillary tenderness and no frontal sinus tenderness , neck supple without lymphadenopathy, oropharynx mildly irritated. No nasal discharge Cardiovascular: Elevated rate, regular rhythm, S1/S2 present.   Pulmonary/Chest: Effort normal and adventitious breath sounds bilaterally- after albuterol treatment expiratory wheezing auscultated  bilaterally.  No respiratory distress or retractions. Psychiatric: Patient has a normal mood and affect. behavior is normal. Judgment and thought content normal.  No results found for this or any previous visit (from the past 72 hour(s)).  Assessment & Plan  1. Cough  - DG Chest 2 View; Future - albuterol (PROVENTIL) (2.5 MG/3ML) 0.083% nebulizer solution 2.5 mg - amoxicillin-clavulanate (AUGMENTIN) 875-125 MG tablet; Take 1 tablet by mouth 2 (two) times daily.  Dispense: 20 tablet; Refill: 0  2. Shortness of breath  - DG Chest 2 View; Future - albuterol (PROVENTIL) (2.5 MG/3ML) 0.083% nebulizer solution 2.5 mg - albuterol (VENTOLIN HFA) 108 (90 Base) MCG/ACT inhaler; Inhale 1-2 puffs into the lungs every 6 (six) hours as needed for wheezing or shortness of breath.  Dispense: 1 Inhaler; Refill: 2 - amoxicillin-clavulanate (AUGMENTIN) 875-125 MG tablet; Take 1 tablet by mouth 2 (two) times daily.  Dispense: 20 tablet; Refill: 0  3. Adventitious breath sounds  - DG Chest  2 View; Future - albuterol (PROVENTIL) (2.5 MG/3ML) 0.083% nebulizer solution 2.5 mg - albuterol (VENTOLIN HFA) 108 (90 Base) MCG/ACT inhaler; Inhale 1-2 puffs into the lungs every 6 (six) hours as needed for wheezing or shortness of breath.  Dispense: 1 Inhaler; Refill: 2 - amoxicillin-clavulanate (AUGMENTIN) 875-125 MG tablet; Take 1 tablet by mouth 2 (two) times daily.  Dispense: 20 tablet; Refill: 0  4. Non-recurrent acute serous otitis media of right ear  - amoxicillin-clavulanate (AUGMENTIN) 875-125 MG tablet; Take 1 tablet by mouth 2 (two) times daily.  Dispense: 20 tablet; Refill: 0 - please go across the street to get a chest xray now - Please pick up your antibiotic today and take your first dose tonight. Take it every 12 hours with some food until it is all gone even if you feel better before it is finished. Please do eat yogurt daily or take a probiotic daily for the next month or two We want to replace the  healthy germs in the gut If you notice foul, watery diarrhea in the next two months, schedule an appointment RIGHT AWAY - Discussed OTC med management  - Please follow-up with [place] in 2 days to ensure you are improving - please call 911 if you are feeling worse or having chest pain or cant catch your breath - I have also prescribed you an albuterol inhaler you can take if you have chest tightness of wheezing. Take 1-2 puffs every 6 hours as needed.  - Please quit smoking.    -Red flags and when to present for emergency care or RTC including fever >101.26F, chest pain, shortness of breath, new/worsening/un-resolving symptoms,  reviewed with patient at time of visit. Follow up and care instructions discussed and provided in AVS.  --------------------------------------- I have reviewed this encounter including the documentation in this note and/or discussed this patient with the provider, Suezanne Cheshire DNP AGNP-C. I am certifying that I agree with the content of this note as supervising physician. Enid Derry, [facility] 01/22/2018, 5:14 PM

## 2018-01-24 ENCOUNTER — Ambulatory Visit (INDEPENDENT_AMBULATORY_CARE_PROVIDER_SITE_OTHER): Payer: Medicare HMO | Admitting: Nurse Practitioner

## 2018-01-24 ENCOUNTER — Encounter: Payer: Self-pay | Admitting: Nurse Practitioner

## 2018-01-24 VITALS — BP 120/72 | HR 98 | Temp 98.2°F | Resp 16 | Ht 64.0 in | Wt 100.2 lb

## 2018-01-24 DIAGNOSIS — J181 Lobar pneumonia, unspecified organism: Secondary | ICD-10-CM

## 2018-01-24 DIAGNOSIS — J189 Pneumonia, unspecified organism: Secondary | ICD-10-CM

## 2018-01-24 DIAGNOSIS — H6691 Otitis media, unspecified, right ear: Secondary | ICD-10-CM

## 2018-01-24 NOTE — Patient Instructions (Addendum)
Please get a chest xray at the imaging center on 02/12/2018 or 02/13/2018 depending on your ability to get a ride.   Continue taking antibiotic twice a day with food.  Please do eat yogurt daily or take a probiotic daily for the next month or two.We want to replace the healthy germs in the gut. If you notice foul, watery diarrhea in the next two months, schedule an appointment RIGHT AWAY  Please take your spiriva 2 puffs once a day every day If your chest is tight or your are having wheezing or shortness of breath use the new inhaler- albuterol 1-2 puffs every 6 hours as you need it.   Please quit smoking  Coping with Quitting Smoking Quitting smoking is a physical and mental challenge. You will face cravings, withdrawal symptoms, and temptation. Before quitting, work with your health care provider to make a plan that can help you cope. Preparation can help you quit and keep you from giving in. How can I cope with cravings? Cravings usually last for 5-10 minutes. If you get through it, the craving will pass. Consider taking the following actions to help you cope with cravings:  Keep your mouth busy: ? Chew sugar-free gum. ? Suck on hard candies or a straw. ? Brush your teeth.  Keep your hands and body busy: ? Immediately change to a different activity when you feel a craving. ? Squeeze or play with a ball. ? Do an activity or a hobby, like making bead jewelry, practicing needlepoint, or working with wood. ? Mix up your normal routine. ? Take a short exercise break. Go for a quick walk or run up and down stairs. ? Spend time in public places where smoking is not allowed.  Focus on doing something kind or helpful for someone else.  Call a friend or family member to talk during a craving.  Join a support group.  Call a quit line, such as 1-800-QUIT-NOW.  Talk with your health care provider about medicines that might help you cope with cravings and make quitting easier for you.  How  can I deal with withdrawal symptoms? Your body may experience negative effects as it tries to get used to not having nicotine in the system. These effects are called withdrawal symptoms. They may include:  Feeling hungrier than normal.  Trouble concentrating.  Irritability.  Trouble sleeping.  Feeling depressed.  Restlessness and agitation.  Craving a cigarette.  To manage withdrawal symptoms:  Avoid places, people, and activities that trigger your cravings.  Remember why you want to quit.  Get plenty of sleep.  Avoid coffee and other caffeinated drinks. These may worsen some of your symptoms.  How can I handle social situations? Social situations can be difficult when you are quitting smoking, especially in the first few weeks. To manage this, you can:  Avoid parties, bars, and other social situations where people might be smoking.  Avoid alcohol.  Leave right away if you have the urge to smoke.  Explain to your family and friends that you are quitting smoking. Ask for understanding and support.  Plan activities with friends or family where smoking is not an option.  What are some ways I can cope with stress? Wanting to smoke may cause stress, and stress can make you want to smoke. Find ways to manage your stress. Relaxation techniques can help. For example:  Breathe slowly and deeply, in through your nose and out through your mouth.  Listen to soothing, relaxing music.  Talk  with a family member or friend about your stress.  Light a candle.  Soak in a bath or take a shower.  Think about a peaceful place.  What are some ways I can prevent weight gain? Be aware that many people gain weight after they quit smoking. However, not everyone does. To keep from gaining weight, have a plan in place before you quit and stick to the plan after you quit. Your plan should include:  Having healthy snacks. When you have a craving, it may help to: ? Eat plain popcorn,  crunchy carrots, celery, or other cut vegetables. ? Chew sugar-free gum.  Changing how you eat: ? Eat small portion sizes at meals. ? Eat 4-6 small meals throughout the day instead of 1-2 large meals a day. ? Be mindful when you eat. Do not watch television or do other things that might distract you as you eat.  Exercising regularly: ? Make time to exercise each day. If you do not have time for a long workout, do short bouts of exercise for 5-10 minutes several times a day. ? Do some form of strengthening exercise, like weight lifting, and some form of aerobic exercise, like running or swimming.  Drinking plenty of water or other low-calorie or no-calorie drinks. Drink 6-8 glasses of water daily, or as much as instructed by your health care provider.  Summary  Quitting smoking is a physical and mental challenge. You will face cravings, withdrawal symptoms, and temptation to smoke again. Preparation can help you as you go through these challenges.  You can cope with cravings by keeping your mouth busy (such as by chewing gum), keeping your body and hands busy, and making calls to family, friends, or a helpline for people who want to quit smoking.  You can cope with withdrawal symptoms by avoiding places where people smoke, avoiding drinks with caffeine, and getting plenty of rest.  Ask your health care provider about the different ways to prevent weight gain, avoid stress, and handle social situations. This information is not intended to replace advice given to you by your health care provider. Make sure you discuss any questions you have with your health care provider. Document Released: 07/21/2016 Document Revised: 07/21/2016 Document Reviewed: 07/21/2016 Elsevier Interactive Patient Education  [employer].

## 2018-01-24 NOTE — Progress Notes (Addendum)
Name: Robin Jones   MRN: [id number]    DOB: 22-Apr-1951   Date:01/24/2018       Progress Note  Subjective  Chief Complaint  Chief Complaint  Patient presents with  . Follow-up    2 day recheck    HPI  Patient was seen 2 day ago for ear infection and possible pneumonia pt was mildly hypotensive and tachycardic and started on Augmentin with strict ER precautions. Chest xray: Patchy increased interstitial and early airspace opacities at the left lung base. Underlying COPD. Followup PA and lateral chest X-ray is recommended in 3-4 weeks following trial of antibiotic therapy to ensure resolution and exclude underlying malignancy.  Presents today for re-evaluation. sts feels way better, ears still feel full but decreased drainage, feels less shortness of breath and cough improving. Sts stopped taking her spiriva and hasn't used albuterol inhaler sent because she didn't know which one to use. Reviewed x-ray results and recommendations with pt.   Still hasnt quit smoking but has cut down amount.   Patient Active Problem List   Diagnosis Date Noted  . Thyroid nodule 06/18/2017  . Underweight 05/08/2017  . Hyperthyroidism 05/08/2017  . Coronary artery disease 10/03/2016  . Aortic atherosclerosis ([place]) 10/03/2016  . Centrilobular emphysema ([place]) 10/03/2016  . HAV (hallux abducto valgus) 03/26/2016  . Bunion of right foot 03/10/2016  . Skin lesion of left leg 03/10/2016  . Senile purpura ([place]) 03/10/2016  . Need for hepatitis C screening test 02/09/2016  . Presbycusis of both ears 02/09/2016  . Osteopenia 12/09/2015  . Insomnia 12/05/2015  . Medication monitoring encounter 11/11/2015  . Adenopathy   . Lung mass   . Lung nodule seen on imaging study 03/29/2015  . Right flank pain 03/19/2015  . GERD without esophagitis 03/03/2015  . Depression, major, recurrent, in partial remission ([place]) 03/03/2015  . CKD (chronic kidney disease) stage 2, GFR 60-89 ml/min 03/03/2015  . COPD, mild  ([place]) 03/03/2015  . Tobacco abuse 03/03/2015  . Chronic constipation 03/03/2015  . Osteoarthritis of multiple joints 03/03/2015  . At risk for falls 03/03/2015  . Thyroid mass of unclear etiology 03/03/2015  . Hyperlipidemia LDL goal <70 03/03/2015  . Need for pneumococcal vaccination 03/03/2015  . Polyp of colon, adenomatous 03/03/2015  . Skin lesion of face 03/03/2015    Past Medical History:  Diagnosis Date  . Aortic atherosclerosis ([place]) 10/03/2016  . Centrilobular emphysema ([place]) 10/03/2016   Noted on CT scan 2017  . Chronic kidney disease   . CKD (chronic kidney disease) stage 2, GFR 60-89 ml/min 03/03/2015  . Constipation   . Depression   . Depression, major, recurrent, in partial remission ([place]) 03/03/2015  . GERD (gastroesophageal reflux disease)   . Hearing loss of both ears   . Hyperlipidemia   . Insomnia 12/05/2015  . Osteopenia 12/09/2015  . Personal history of tobacco use, presenting hazards to health 03/23/2015  . Skin cancer of face 2013   Left side of face resected.     Past Surgical History:  Procedure Laterality Date  . ABDOMINAL HYSTERECTOMY  1980   partial  . BACK SURGERY  1978  . COLONOSCOPY N/A 12/22/2014   Procedure: COLONOSCOPY;  Surgeon: Lucilla Lame, MD;  Location: [facility] ENDOSCOPY;  Service: Endoscopy;  Laterality: N/A;  . ELECTROMAGNETIC NAVIGATION BROCHOSCOPY N/A 04/15/2015   Procedure: ELECTROMAGNETIC NAVIGATION BRONCHOSCOPY;  Surgeon: Flora Lipps, MD;  Location: [facility] ORS;  Service: Cardiopulmonary;  Laterality: N/A;  . ENDOBRONCHIAL ULTRASOUND N/A 04/15/2015   Procedure: ENDOBRONCHIAL  ULTRASOUND;  Surgeon: Flora Lipps, MD;  Location: [facility] ORS;  Service: Cardiopulmonary;  Laterality: N/A;  . ENDOBRONCHIAL ULTRASOUND N/A 06/15/2015   Procedure: ENDOBRONCHIAL ULTRASOUND;  Surgeon: Flora Lipps, MD;  Location: [facility] ORS;  Service: Cardiopulmonary;  Laterality: N/A;  . INNER EAR SURGERY Left 2013  . TONSILLECTOMY  13    Social History   Tobacco Use  .  Smoking status: Current Every Day Smoker    Packs/day: 1.00    Years: 45.00    Pack years: 45.00    Types: Cigarettes  . Smokeless tobacco: Never Used  Substance Use Topics  . Alcohol use: No    Alcohol/week: 0.0 oz     Current Outpatient Medications:  .  acetaminophen (TYLENOL) 325 MG tablet, Take 650 mg by mouth every 6 (six) hours as needed for moderate pain or headache., Disp: , Rfl:  .  albuterol (VENTOLIN HFA) 108 (90 Base) MCG/ACT inhaler, Inhale 1-2 puffs into the lungs every 6 (six) hours as needed for wheezing or shortness of breath., Disp: 1 Inhaler, Rfl: 2 .  amoxicillin-clavulanate (AUGMENTIN) 875-125 MG tablet, Take 1 tablet by mouth 2 (two) times daily., Disp: 20 tablet, Rfl: 0 .  aspirin EC 81 MG tablet, Take 1 tablet (81 mg total) by mouth daily., Disp: , Rfl:  .  cholecalciferol (VITAMIN D) 1000 units tablet, Take 1,000 Units by mouth daily., Disp: , Rfl:  .  escitalopram (LEXAPRO) 10 MG tablet, Take 1 tablet (10 mg total) daily by mouth., Disp: 90 tablet, Rfl: 3 .  hydrOXYzine (ATARAX/VISTARIL) 25 MG tablet, Take 1 tablet (25 mg total) by mouth 3 (three) times daily as needed., Disp: 15 tablet, Rfl: 0 .  loratadine (CLARITIN) 10 MG tablet, Take 10 mg daily by mouth., Disp: , Rfl:  .  lovastatin (MEVACOR) 40 MG tablet, Take 1 tablet (40 mg total) by mouth at bedtime., Disp: 90 tablet, Rfl: 2 .  Tiotropium Bromide Monohydrate (SPIRIVA RESPIMAT) 2.5 MCG/ACT AERS, Inhale 2 puffs daily into the lungs., Disp: 3 Inhaler, Rfl: 3  Current Facility-Administered Medications:  .  albuterol (PROVENTIL) (2.5 MG/3ML) 0.083% nebulizer solution 2.5 mg, 2.5 mg, Nebulization, Once, Makana Rostad, Bethel Born, NP  Allergies  Allergen Reactions  . Trelegy Ellipta [Fluticasone-Umeclidin-Vilant] Other (See Comments)    Pt states that this made her mouth and throat very sore, unable to tolerate     ROS  Constitutional: Negative for fever or weight change.  Respiratory: Positive for cough and  shortness of breath.   Cardiovascular: Negative for chest pain or palpitations.  Gastrointestinal: Negative for abdominal pain, no bowel changes.  Musculoskeletal: Negative for gait problem or joint swelling.  Skin: Negative for rash.  Neurological: Negative for dizziness or headache.  No other specific complaints in a complete review of systems (except as listed in HPI above).  Objective  Vitals:   01/24/18 1024  BP: 120/72  Pulse: 98  Resp: 16  Temp: 98.2 F (36.8 C)  TempSrc: Oral  SpO2: 96%  Weight: 100 lb 3.2 oz (45.5 kg)  Height: 5\' 4"  (1.626 m)     Body mass index is 17.2 kg/m.  Nursing Note and Vital Signs reviewed.  Physical Exam   Constitutional: Patient appears well-developed and well-nourished. No distress. Smiling, skin warm and dry appears much improved HEENT: head atraumatic, normocephalic, pupils equal and reactive to light, difficult to visualize bilateral TMs, no obvious bulging in left TM, Right TM noted dried blood, non-tender. no maxillary or frontal sinus tenderness , neck supple without  lymphadenopathy, oropharynx pink and moist without exudate, no nasal discharge Cardiovascular: Normal rate, regular rhythm, S1/S2 present.  Pulmonary/Chest: Effort normal and breath sounds - mild expiratory wheezing throughout bilaterally, left lower lobe- adventitious breath sounds.   Psychiatric: Patient has a normal mood and affect. behavior is normal. Judgment and thought content normal.  No results found for this or any previous visit (from the past 72 hour(s)).  Assessment & Plan  1. Pneumonia of left lower lobe due to infectious organism [facility]) -improving- symptomatically   2. Right otitis media, unspecified otitis media type - seems to be improving, will refer to ENT if not improved with abx course.   Please get a chest xray at the imaging center on 02/12/2018 or 02/13/2018 depending on your ability to get a ride.   Continue taking antibiotic twice a day  with food.  Please do eat yogurt daily or take a probiotic daily for the next month or two.We want to replace the healthy germs in the gut. If you notice foul, watery diarrhea in the next two months, schedule an appointment RIGHT AWAY  Please take your spiriva 2 puffs once a day every day If your chest is tight or your are having wheezing or shortness of breath use the new inhaler- albuterol 1-2 puffs every 6 hours as you need it.   Please quit smoking   -Red flags and when to present for emergency care or RTC including fever >101.79F, chest pain, shortness of breath, new/worsening/un-resolving symptoms, ear pain worsening reviewed with patient at time of visit. Follow up and care instructions discussed and provided in AVS.  ------------------------------------------- I have reviewed this encounter including the documentation in this note and/or discussed this patient with the provider, Suezanne Cheshire DNP AGNP-C. I am certifying that I agree with the content of this note as supervising physician. Enid Derry, [facility] 01/30/2018, 4:43 PM

## 2018-02-13 ENCOUNTER — Ambulatory Visit
Admission: RE | Admit: 2018-02-13 | Discharge: 2018-02-13 | Disposition: A | Discharge status: Home / Self Care | Payer: Medicare HMO | Source: Ambulatory Visit | Attending: Nurse Practitioner | Admitting: Nurse Practitioner

## 2018-02-13 DIAGNOSIS — E042 Nontoxic multinodular goiter: Secondary | ICD-10-CM | POA: Diagnosis not present

## 2018-02-13 DIAGNOSIS — H906 Mixed conductive and sensorineural hearing loss, bilateral: Secondary | ICD-10-CM | POA: Diagnosis not present

## 2018-02-13 DIAGNOSIS — R918 Other nonspecific abnormal finding of lung field: Secondary | ICD-10-CM | POA: Insufficient documentation

## 2018-02-13 DIAGNOSIS — H6980 Other specified disorders of Eustachian tube, unspecified ear: Secondary | ICD-10-CM | POA: Diagnosis not present

## 2018-02-13 DIAGNOSIS — J984 Other disorders of lung: Secondary | ICD-10-CM | POA: Diagnosis not present

## 2018-02-14 ENCOUNTER — Ambulatory Visit: Payer: Medicare HMO | Admitting: Family Medicine

## 2018-02-19 DIAGNOSIS — R918 Other nonspecific abnormal finding of lung field: Secondary | ICD-10-CM | POA: Diagnosis not present

## 2018-02-19 DIAGNOSIS — R05 Cough: Secondary | ICD-10-CM | POA: Diagnosis not present

## 2018-02-19 DIAGNOSIS — Z720 Tobacco use: Secondary | ICD-10-CM | POA: Diagnosis not present

## 2018-02-19 DIAGNOSIS — J449 Chronic obstructive pulmonary disease, unspecified: Secondary | ICD-10-CM | POA: Diagnosis not present

## 2018-06-05 ENCOUNTER — Other Ambulatory Visit: Payer: Self-pay | Admitting: Family Medicine

## 2018-06-05 DIAGNOSIS — F3341 Major depressive disorder, recurrent, in partial remission: Secondary | ICD-10-CM

## 2018-06-06 NOTE — Telephone Encounter (Signed)
Please schedule follow up appt within the next 4 weeks.

## 2018-06-19 DIAGNOSIS — Z0100 Encounter for examination of eyes and vision without abnormal findings: Secondary | ICD-10-CM | POA: Diagnosis not present

## 2018-06-19 DIAGNOSIS — H5203 Hypermetropia, bilateral: Secondary | ICD-10-CM | POA: Diagnosis not present

## 2018-06-25 ENCOUNTER — Ambulatory Visit
Admission: RE | Admit: 2018-06-25 | Discharge: 2018-06-25 | Disposition: A | Discharge status: Home / Self Care | Payer: Medicare HMO | Source: Ambulatory Visit | Attending: Family Medicine | Admitting: Family Medicine

## 2018-06-25 ENCOUNTER — Encounter: Payer: Self-pay | Admitting: Family Medicine

## 2018-06-25 ENCOUNTER — Ambulatory Visit (INDEPENDENT_AMBULATORY_CARE_PROVIDER_SITE_OTHER): Payer: Medicare HMO | Admitting: Family Medicine

## 2018-06-25 VITALS — BP 138/70 | HR 92 | Temp 98.6°F | Ht 64.0 in | Wt 115.8 lb

## 2018-06-25 DIAGNOSIS — Z720 Tobacco use: Secondary | ICD-10-CM

## 2018-06-25 DIAGNOSIS — D692 Other nonthrombocytopenic purpura: Secondary | ICD-10-CM

## 2018-06-25 DIAGNOSIS — I700 Atherosclerosis of aorta: Secondary | ICD-10-CM | POA: Diagnosis not present

## 2018-06-25 DIAGNOSIS — E041 Nontoxic single thyroid nodule: Secondary | ICD-10-CM

## 2018-06-25 DIAGNOSIS — N182 Chronic kidney disease, stage 2 (mild): Secondary | ICD-10-CM

## 2018-06-25 DIAGNOSIS — F17200 Nicotine dependence, unspecified, uncomplicated: Secondary | ICD-10-CM | POA: Insufficient documentation

## 2018-06-25 DIAGNOSIS — Z23 Encounter for immunization: Secondary | ICD-10-CM

## 2018-06-25 DIAGNOSIS — R51 Headache: Secondary | ICD-10-CM | POA: Diagnosis not present

## 2018-06-25 DIAGNOSIS — R0781 Pleurodynia: Secondary | ICD-10-CM

## 2018-06-25 DIAGNOSIS — J449 Chronic obstructive pulmonary disease, unspecified: Secondary | ICD-10-CM | POA: Diagnosis not present

## 2018-06-25 DIAGNOSIS — R079 Chest pain, unspecified: Secondary | ICD-10-CM | POA: Diagnosis not present

## 2018-06-25 DIAGNOSIS — R519 Headache, unspecified: Secondary | ICD-10-CM

## 2018-06-25 DIAGNOSIS — R0602 Shortness of breath: Secondary | ICD-10-CM | POA: Diagnosis not present

## 2018-06-25 DIAGNOSIS — E0590 Thyrotoxicosis, unspecified without thyrotoxic crisis or storm: Secondary | ICD-10-CM | POA: Diagnosis not present

## 2018-06-25 MED ORDER — VARENICLINE TARTRATE 0.5 MG X 11 & 1 MG X 42 PO MISC: ORAL | 0 refills | Status: DC

## 2018-06-25 MED ORDER — VARENICLINE TARTRATE 1 MG PO TABS: 1.0000 mg | ORAL_TABLET | Freq: Two times a day (BID) | ORAL | 2 refills | Status: DC

## 2018-06-25 NOTE — Progress Notes (Signed)
BP 138/70   Pulse 92   Temp 98.6 F (37 C) (Oral)   Ht '5\' 4"'  (1.626 m)   Wt 115 lb 12.8 oz (52.5 kg)   SpO2 99%   BMI 19.88 kg/m    Subjective:    Patient ID: Robin Jones, female    DOB: 06/03/1951, 67 y.o.   MRN: [id number]  HPI: Robin Jones is a 66 y.o. female  Chief Complaint  Patient presents with  . Follow-up  . Back Pain    upper back, hurts to breath. Onset 3 days    HPI Patient is here for follow-up She starts out by telling me that she has had two nephews to die in the last 6 months; ages 59 and 76; she just saw the one about 3 weeks before that; she does not know how they died; she says her sister didn't tell her why and patient hasn't asked She saw the pulmonologist and consulted with Dr. Raul Del on February 19, 2018 for cough and pulmonary nodules She is still smoking; she has tried the patches and they just don't work for her; she tried quitting once before and wonders if the patches are old; Paediatric nurse brand maybe cheaper; she  First cigarette is 9 or 10 am; pretty quickly after waking up, after making cup of coffee; last cigarettes of the day is 30 minutes before bed She says her head hurts all over the top and left side; hurts for 2-3 days, gets sore to the touch, can't stand to brush her hair; no vision changes She has some pain when taking a deep breath, just on the posterior right side; 3 days; not getting any better; just with breathing; she cut down on smoking; nothing OTC  Depression screen [facility] 2/9 06/25/2018 11/29/2017 10/01/2017 06/18/2017 05/07/2017  Decreased Interest 0 0 0 0 0  Down, Depressed, Hopeless 0 0 0 1 0  PHQ - 2 Score 0 0 0 1 0  Altered sleeping 0 - - - -  Tired, decreased energy 0 - - - -  Change in appetite 0 - - - -  Feeling bad or failure about yourself  0 - - - -  Trouble concentrating 0 - - - -  Moving slowly or fidgety/restless 0 - - - -  Suicidal thoughts 0 - - - -  PHQ-9 Score 0 - - - -  Difficult doing work/chores Not difficult  at all - - - -   Fall Risk  06/25/2018 11/29/2017 10/01/2017 06/18/2017 05/07/2017  Falls in the past year? 0 No No No No  Number falls in past yr: 0 - - - -  Injury with Fall? 0 - - - -    Relevant past medical, surgical, family and social history reviewed Past Medical History:  Diagnosis Date  . Aortic atherosclerosis ([place]) 10/03/2016  . Centrilobular emphysema ([place]) 10/03/2016   Noted on CT scan 2017  . Chronic kidney disease   . CKD (chronic kidney disease) stage 2, GFR 60-89 ml/min 03/03/2015  . Constipation   . Depression   . Depression, major, recurrent, in partial remission ([place]) 03/03/2015  . GERD (gastroesophageal reflux disease)   . Hearing loss of both ears   . Hyperlipidemia   . Insomnia 12/05/2015  . Osteopenia 12/09/2015  . Personal history of tobacco use, presenting hazards to health 03/23/2015  . Skin cancer of face 2013   Left side of face resected.    Past Surgical History:  Procedure Laterality Date  .  ABDOMINAL HYSTERECTOMY  1980   partial  . BACK SURGERY  1978  . COLONOSCOPY N/A 12/22/2014   Procedure: COLONOSCOPY;  Surgeon: Lucilla Lame, MD;  Location: [facility] ENDOSCOPY;  Service: Endoscopy;  Laterality: N/A;  . ELECTROMAGNETIC NAVIGATION BROCHOSCOPY N/A 04/15/2015   Procedure: ELECTROMAGNETIC NAVIGATION BRONCHOSCOPY;  Surgeon: Flora Lipps, MD;  Location: [facility] ORS;  Service: Cardiopulmonary;  Laterality: N/A;  . ENDOBRONCHIAL ULTRASOUND N/A 04/15/2015   Procedure: ENDOBRONCHIAL ULTRASOUND;  Surgeon: Flora Lipps, MD;  Location: [facility] ORS;  Service: Cardiopulmonary;  Laterality: N/A;  . ENDOBRONCHIAL ULTRASOUND N/A 06/15/2015   Procedure: ENDOBRONCHIAL ULTRASOUND;  Surgeon: Flora Lipps, MD;  Location: [facility] ORS;  Service: Cardiopulmonary;  Laterality: N/A;  . INNER EAR SURGERY Left 2013  . TONSILLECTOMY  13   Family History  Problem Relation Age of Onset  . Cancer Sister        liver  . Thyroid disease Father   . Heart disease Mother   . Hepatitis C Sister   . Thyroid  disease Sister   . Cancer Brother 55       in muscle of leg  . Heart disease Brother   . Arthritis Brother   . COPD Sister   . Hypertension Sister   . Hyperlipidemia Sister   . Diabetes Maternal Grandmother   . Kidney disease Maternal Grandmother   . Arthritis Brother   . Breast cancer Maternal Aunt   . Breast cancer Maternal Aunt    Social History   Tobacco Use  . Smoking status: Current Every Day Smoker    Packs/day: 1.00    Years: 45.00    Pack years: 45.00    Types: Cigarettes  . Smokeless tobacco: Never Used  Substance Use Topics  . Alcohol use: No    Alcohol/week: 0.0 standard drinks  . Drug use: No     [facility]/25/[facility]  AUDIT-C Score  0      Interim medical history since last visit reviewed. Allergies and medications reviewed  Review of Systems Per HPI unless specifically indicated above     Objective:    BP 138/70   Pulse 92   Temp 98.6 F (37 C) (Oral)   Ht '5\' 4"'  (1.626 m)   Wt 115 lb 12.8 oz (52.5 kg)   SpO2 99%   BMI 19.88 kg/m   Wt Readings from Last 3 Encounters:  06/25/18 115 lb 12.8 oz (52.5 kg)  01/24/18 100 lb 3.2 oz (45.5 kg)  01/22/18 100 lb 14.4 oz (45.8 kg)    Physical Exam  Constitutional: She appears well-developed and well-nourished. No distress.  HENT:  Head: Normocephalic and atraumatic.  Eyes: EOM are normal. No scleral icterus.  Neck: No thyromegaly present.  Cardiovascular: Normal rate, regular rhythm and normal heart sounds.  No murmur heard. Pulmonary/Chest: Effort normal and breath sounds normal. No respiratory distress. She has no wheezes.  Abdominal: Soft. Bowel sounds are normal. She exhibits no distension.  Musculoskeletal: She exhibits no edema.  Neurological: She is alert.  Skin: Skin is warm and dry. Ecchymosis (on extensor surface of arms) noted. She is not diaphoretic. No pallor.  Psychiatric: She has a normal mood and affect. Her behavior is normal. Judgment  and thought content normal.      Assessment & Plan:   Problem List Items Addressed This Visit      Cardiovascular and Mediastinum   Senile purpura ([place])   Relevant Orders   CBC with Differential/Platelet (Completed)  Aortic atherosclerosis ([facility])   Relevant Orders   Lipid panel (Completed)     Respiratory   COPD, mild ([facility]) - Primary   Relevant Medications   varenicline (CHANTIX STARTING MONTH PAK) 0.5 MG X 11 & 1 MG X 42 tablet   varenicline (CHANTIX CONTINUING MONTH PAK) 1 MG tablet   Other Relevant Orders   DG Chest 2 View (Completed)     Endocrine   Hyperthyroidism   Relevant Orders   TSH (Completed)   Thyroid nodule     Genitourinary   CKD (chronic kidney disease) stage 2, GFR 60-89 ml/min   Relevant Orders   COMPLETE METABOLIC PANEL WITH GFR (Completed)     Other   Tobacco abuse    Other Visit Diagnoses    Nonintractable headache, unspecified chronicity pattern, unspecified headache type       Relevant Orders   Sed Rate (ESR) (Completed)   Pleuritic pain       Relevant Orders   DG Chest 2 View (Completed)   Need for influenza vaccination       Relevant Orders   Flu vaccine HIGH DOSE PF (Fluzone High dose)       Follow up plan: Return in about 4 weeks (around 07/23/2018) for follow-up visit with Dr. Sanda Klein.  An after-visit summary was printed and given to the patient at [facility].  Please see the patient instructions which may contain other information and recommendations beyond what is mentioned above in the assessment and plan.  Meds ordered this encounter  Medications  . varenicline (CHANTIX STARTING MONTH PAK) 0.5 MG X 11 & 1 MG X 42 tablet    Sig: Take one 0.5 mg tablet by mouth once daily for 3 days, then increase to one 0.5 mg tablet twice daily for 4 days, then increase to one 1 mg tablet twice daily.    Dispense:  53 tablet    Refill:  0    Fill first  . varenicline (CHANTIX CONTINUING MONTH PAK) 1 MG tablet    Sig: Take 1 tablet (1 mg total)  by mouth 2 (two) times daily.    Dispense:  60 tablet    Refill:  2    Fill after the starter pack    Orders Placed This Encounter  Procedures  . DG Chest 2 View  . Flu vaccine HIGH DOSE PF (Fluzone High dose)  . CBC with Differential/Platelet  . COMPLETE METABOLIC PANEL WITH GFR  . Lipid panel  . TSH  . Sed Rate (ESR)

## 2018-06-25 NOTE — Patient Instructions (Addendum)
Start the Chantix starter pack Fill the maintenance pack after that and stay on that for 2-3 months  Let's get labs today Please have the chest xray done across the street  Good luck with quitting smoking!  Steps to Quit Smoking Smoking tobacco can be bad for your health. It can also affect almost every organ in your body. Smoking puts you and people around you at risk for many serious long-lasting (chronic) diseases. Quitting smoking is hard, but it is one of the best things that you can do for your health. It is never too late to quit. What are the benefits of quitting smoking? When you quit smoking, you lower your risk for getting serious diseases and conditions. They can include:  Lung cancer or lung disease.  Heart disease.  Stroke.  Heart attack.  Not being able to have children (infertility).  Weak bones (osteoporosis) and broken bones (fractures).  If you have coughing, wheezing, and shortness of breath, those symptoms may get better when you quit. You may also get sick less often. If you are pregnant, quitting smoking can help to lower your chances of having a baby of low birth weight. What can I do to help me quit smoking? Talk with your doctor about what can help you quit smoking. Some things you can do (strategies) include:  Quitting smoking totally, instead of slowly cutting back how much you smoke over a period of time.  Going to in-person counseling. You are more likely to quit if you go to many counseling sessions.  Using resources and support systems, such as: ? Database administrator with a Social worker. ? Phone quitlines. ? Careers information officer. ? Support groups or group counseling. ? Text messaging programs. ? Mobile phone apps or applications.  Taking medicines. Some of these medicines may have nicotine in them. If you are pregnant or breastfeeding, do not take any medicines to quit smoking unless your doctor says it is okay. Talk with your doctor about  counseling or other things that can help you.  Talk with your doctor about using more than one strategy at the same time, such as taking medicines while you are also going to in-person counseling. This can help make quitting easier. What things can I do to make it easier to quit? Quitting smoking might feel very hard at first, but there is a lot that you can do to make it easier. Take these steps:  Talk to your family and friends. Ask them to support and encourage you.  Call phone quitlines, reach out to support groups, or work with a Social worker.  Ask people who smoke to not smoke around you.  Avoid places that make you want (trigger) to smoke, such as: ? Bars. ? Parties. ? Smoke-break areas at work.  Spend time with people who do not smoke.  Lower the stress in your life. Stress can make you want to smoke. Try these things to help your stress: ? Getting regular exercise. ? Deep-breathing exercises. ? Yoga. ? Meditating. ? Doing a body scan. To do this, close your eyes, focus on one area of your body at a time from head to toe, and notice which parts of your body are tense. Try to relax the muscles in those areas.  Download or buy apps on your mobile phone or tablet that can help you stick to your quit plan. There are many free apps, such as QuitGuide from the [employer] Office manager for Disease Control and Prevention). You can find more support  from smokefree.gov and other websites.  This information is not intended to replace advice given to you by your health care provider. Make sure you discuss any questions you have with your health care provider. Document Released: 05/20/2009 Document Revised: 03/21/2016 Document Reviewed: 12/08/2014 Elsevier Interactive Patient Education  2018 [employer].

## 2018-06-26 ENCOUNTER — Other Ambulatory Visit: Payer: Self-pay | Admitting: Family Medicine

## 2018-06-26 ENCOUNTER — Telehealth: Payer: Self-pay

## 2018-06-26 DIAGNOSIS — E785 Hyperlipidemia, unspecified: Secondary | ICD-10-CM

## 2018-06-26 LAB — CBC WITH DIFFERENTIAL/PLATELET
BASOS ABS: 42 {cells}/uL (ref 0–200)
Basophils Relative: 0.5 %
EOS ABS: 8 {cells}/uL — AB (ref 15–500)
Eosinophils Relative: 0.1 %
HEMATOCRIT: 36.1 % (ref 35.0–45.0)
Hemoglobin: 12.2 g/dL (ref 11.7–15.5)
LYMPHS ABS: 1610 {cells}/uL (ref 850–3900)
MCH: 29.9 pg (ref 27.0–33.0)
MCHC: 33.8 g/dL (ref 32.0–36.0)
MCV: 88.5 fL (ref 80.0–100.0)
MPV: 11.1 fL (ref 7.5–12.5)
Monocytes Relative: 6.2 %
NEUTROS PCT: 73.8 %
Neutro Abs: 6125 cells/uL (ref 1500–7800)
Platelets: 203 10*3/uL (ref 140–400)
RBC: 4.08 10*6/uL (ref 3.80–5.10)
RDW: 12.1 % (ref 11.0–15.0)
Total Lymphocyte: 19.4 %
WBC: 8.3 10*3/uL (ref 3.8–10.8)
WBCMIX: 515 {cells}/uL (ref 200–950)

## 2018-06-26 LAB — LIPID PANEL
Cholesterol: 196 mg/dL (ref <200)
HDL: 61 mg/dL (ref >50)
LDL Cholesterol (Calc): 116 mg/dL (calc) — ABNORMAL HIGH
NON-HDL CHOLESTEROL (CALC): 135 mg/dL — AB (ref <130)
Total CHOL/HDL Ratio: 3.2 (calc) (ref <5.0)
Triglycerides: 87 mg/dL (ref <150)

## 2018-06-26 LAB — COMPLETE METABOLIC PANEL WITH GFR
AG RATIO: 1.9 (calc) (ref 1.0–2.5)
ALT: 4 U/L — ABNORMAL LOW (ref 6–29)
AST: 12 U/L (ref 10–35)
Albumin: 4.4 g/dL (ref 3.6–5.1)
Alkaline phosphatase (APISO): 93 U/L (ref 33–130)
BUN/Creatinine Ratio: 15 (calc) (ref 6–22)
BUN: 15 mg/dL (ref 7–25)
CALCIUM: 10.1 mg/dL (ref 8.6–10.4)
CO2: 29 mmol/L (ref 20–32)
Chloride: 104 mmol/L (ref 98–110)
Creat: 1 mg/dL — ABNORMAL HIGH (ref 0.50–0.99)
GFR, EST NON AFRICAN AMERICAN: 57 mL/min/{1.73_m2} — AB (ref >=60)
GFR, Est African American: 67 mL/min/{1.73_m2} (ref >=60)
GLOBULIN: 2.3 g/dL (ref 1.9–3.7)
Glucose, Bld: 102 mg/dL — ABNORMAL HIGH (ref 65–99)
Potassium: 3.7 mmol/L (ref 3.5–5.3)
SODIUM: 143 mmol/L (ref 135–146)
Total Bilirubin: 0.9 mg/dL (ref 0.2–1.2)
Total Protein: 6.7 g/dL (ref 6.1–8.1)

## 2018-06-26 LAB — SEDIMENTATION RATE: Sed Rate: 17 mm/h (ref 0–30)

## 2018-06-26 LAB — TSH: TSH: 0.48 m[IU]/L (ref 0.40–4.50)

## 2018-06-26 MED ORDER — ATORVASTATIN CALCIUM 40 MG PO TABS: 40.0000 mg | ORAL_TABLET | Freq: Every day | ORAL | 1 refills | Status: DC

## 2018-06-26 NOTE — Telephone Encounter (Signed)
-----   Message from Arnetha Courser, MD sent at 06/26/2018  8:19 AM EST ----- Please let patient know that her lungs show COPD; there is no evidence of pneumonia; she does have plaque in the aorta, the big blood vessel that carries blood from the heart to the rest of the body; that's probably from her years of smoking; stop lovastatin and start atorvastatin; recheck lipids in 6 weeks (please ORDER lipid panel); smoking cessation is absolutely the most important thing to preventing her arteries from getting more blocked up

## 2018-06-26 NOTE — Progress Notes (Signed)
Stop lovastatin; start 40 mg atorvastatin; recheck lipids in 6 weeks

## 2018-07-01 DIAGNOSIS — J449 Chronic obstructive pulmonary disease, unspecified: Secondary | ICD-10-CM | POA: Diagnosis not present

## 2018-07-02 ENCOUNTER — Telehealth: Payer: Self-pay | Admitting: Family Medicine

## 2018-07-02 DIAGNOSIS — Z23 Encounter for immunization: Secondary | ICD-10-CM | POA: Diagnosis not present

## 2018-07-02 NOTE — Addendum Note (Signed)
Addended by: Docia Furl on: 07/02/2018 09:12 AM   Modules accepted: Orders

## 2018-07-02 NOTE — Telephone Encounter (Signed)
Patient is due for her AWV-I and I left her a message to please return my call at [phone] to see if we could get her scheduled the same day she comes in to see Dr.Lada on 07/25/18. We can do both visits if she can see the [facility] at 2:50 and Dr.Lada at 3:40.

## 2018-07-25 ENCOUNTER — Ambulatory Visit (INDEPENDENT_AMBULATORY_CARE_PROVIDER_SITE_OTHER): Payer: Medicare HMO | Admitting: Family Medicine

## 2018-07-25 ENCOUNTER — Encounter: Payer: Self-pay | Admitting: Family Medicine

## 2018-07-25 DIAGNOSIS — I25119 Atherosclerotic heart disease of native coronary artery with unspecified angina pectoris: Secondary | ICD-10-CM | POA: Diagnosis not present

## 2018-07-25 DIAGNOSIS — E785 Hyperlipidemia, unspecified: Secondary | ICD-10-CM | POA: Diagnosis not present

## 2018-07-25 DIAGNOSIS — J432 Centrilobular emphysema: Secondary | ICD-10-CM

## 2018-07-25 DIAGNOSIS — Z23 Encounter for immunization: Secondary | ICD-10-CM

## 2018-07-25 NOTE — Assessment & Plan Note (Signed)
Continue the newer statin, check lipids in mid-January; limit saturated fats

## 2018-07-25 NOTE — Patient Instructions (Addendum)
Please do call Dr. Etta Quill office to schedule an appointment Phone: [phone] He can get those tests that he wanted to check out your heart   Return the week of January 8th (the birthday of Elvis) for your labs; you do not need to fast  Try to limit saturated fats in your diet (bologna, hot dogs, barbeque, cheeseburgers, hamburgers, steak, bacon, sausage, cheese, etc.) and get more fresh fruits, vegetables, and whole grains  I do encourage you to quit smoking Call [phone] to sign up for smoking cessation classes You can call 1-800-QUIT-NOW to talk with a smoking cessation coach  Coping with Quitting Smoking  Quitting smoking is a physical and mental challenge. You will face cravings, withdrawal symptoms, and temptation. Before quitting, work with your health care provider to make a plan that can help you cope. Preparation can help you quit and keep you from giving in. How can I cope with cravings? Cravings usually last for 5-10 minutes. If you get through it, the craving will pass. Consider taking the following actions to help you cope with cravings:  Keep your mouth busy: ? Chew sugar-free gum. ? Suck on hard candies or a straw. ? Brush your teeth.  Keep your hands and body busy: ? Immediately change to a different activity when you feel a craving. ? Squeeze or play with a ball. ? Do an activity or a hobby, like making bead jewelry, practicing needlepoint, or working with wood. ? Mix up your normal routine. ? Take a short exercise break. Go for a quick walk or run up and down stairs. ? Spend time in public places where smoking is not allowed.  Focus on doing something kind or helpful for someone else.  Call a friend or family member to talk during a craving.  Join a support group.  Call a quit line, such as 1-800-QUIT-NOW.  Talk with your health care provider about medicines that might help you cope with cravings and make quitting easier for you. How can I deal  with withdrawal symptoms? Your body may experience negative effects as it tries to get used to not having nicotine in the system. These effects are called withdrawal symptoms. They may include:  Feeling hungrier than normal.  Trouble concentrating.  Irritability.  Trouble sleeping.  Feeling depressed.  Restlessness and agitation.  Craving a cigarette. To manage withdrawal symptoms:  Avoid places, people, and activities that trigger your cravings.  Remember why you want to quit.  Get plenty of sleep.  Avoid coffee and other caffeinated drinks. These may worsen some of your symptoms. How can I handle social situations? Social situations can be difficult when you are quitting smoking, especially in the first few weeks. To manage this, you can:  Avoid parties, bars, and other social situations where people might be smoking.  Avoid alcohol.  Leave right away if you have the urge to smoke.  Explain to your family and friends that you are quitting smoking. Ask for understanding and support.  Plan activities with friends or family where smoking is not an option. What are some ways I can cope with stress? Wanting to smoke may cause stress, and stress can make you want to smoke. Find ways to manage your stress. Relaxation techniques can help. For example:  Breathe slowly and deeply, in through your nose and out through your mouth.  Listen to soothing, relaxing music.  Talk with a family member or friend about your stress.  Light a candle.  Soak in a bath  or take a shower.  Think about a peaceful place. What are some ways I can prevent weight gain? Be aware that many people gain weight after they quit smoking. However, not everyone does. To keep from gaining weight, have a plan in place before you quit and stick to the plan after you quit. Your plan should include:  Having healthy snacks. When you have a craving, it may help to: ? Eat plain popcorn, crunchy carrots,  celery, or other cut vegetables. ? Chew sugar-free gum.  Changing how you eat: ? Eat small portion sizes at meals. ? Eat 4-6 small meals throughout the day instead of 1-2 large meals a day. ? Be mindful when you eat. Do not watch television or do other things that might distract you as you eat.  Exercising regularly: ? Make time to exercise each day. If you do not have time for a long workout, do short bouts of exercise for 5-10 minutes several times a day. ? Do some form of strengthening exercise, like weight lifting, and some form of aerobic exercise, like running or swimming.  Drinking plenty of water or other low-calorie or no-calorie drinks. Drink 6-8 glasses of water daily, or as much as instructed by your health care provider. Summary  Quitting smoking is a physical and mental challenge. You will face cravings, withdrawal symptoms, and temptation to smoke again. Preparation can help you as you go through these challenges.  You can cope with cravings by keeping your mouth busy (such as by chewing gum), keeping your body and hands busy, and making calls to family, friends, or a helpline for people who want to quit smoking.  You can cope with withdrawal symptoms by avoiding places where people smoke, avoiding drinks with caffeine, and getting plenty of rest.  Ask your health care provider about the different ways to prevent weight gain, avoid stress, and handle social situations. This information is not intended to replace advice given to you by your health care provider. Make sure you discuss any questions you have with your health care provider. Document Released: 07/21/2016 Document Revised: 07/21/2016 Document Reviewed: 07/21/2016 Elsevier Interactive Patient Education  2019 [employer].

## 2018-07-25 NOTE — Assessment & Plan Note (Signed)
Reviewed the last note from Dr. Clayborn Bigness, cardiologist; proud of her for cutting back on her cigarettes; encouraged complete cessation; I gave her the number for the cardiologist and urged her to f/u with him and get that cardiac testing done ASAP; continue aspirin daily

## 2018-07-25 NOTE — Assessment & Plan Note (Signed)
Monitored by pulmonologist; encuraged smoking cessation

## 2018-07-25 NOTE — Progress Notes (Signed)
BP 128/72   Pulse 69   Temp 97.6 F (36.4 C) (Oral)   Ht '5\' 4"'  (1.626 m)   Wt 116 lb 12.8 oz (53 kg)   SpO2 97%   BMI 20.05 kg/m    Subjective:    Patient ID: Robin Jones, female    DOB: 12-07-1950, 67 y.o.   MRN: [id number]  HPI: Robin Jones is a 67 y.o. female  Chief Complaint  Patient presents with  . Follow-up    HPI Patient is here for f/u  She saw Dr. Raul Del in November; everything was good and she is going to go back to see him in 6 months She is still smoking, but down to 2-3 cigarettes a day and some days does not even smoke at all; used to smoke 1 ppd She started the Chantix; no nightmares; no SI/HI  She had the appt with the heart doctor but she had to cancel it; car problems Positive family hx of heart attacks; mother was 70 years old; brother has stents, had several heart attacks; niece in her 61s has heart disease CT chest showed atherosclerosis in the coronary arteries She never did have the stress test that he recommended She takes 81 mg aspirin; no nosebleeds, no gum bleeding, no hematuria, and no blood from rectum No chest pain right now  High cholesterol; goal LDL is less than 70; she started the atorvastatin just before Thanksgiving  Lab Results  Component Value Date   CHOL 196 06/25/2018   HDL 61 06/25/2018   LDLCALC 116 (H) 06/25/2018   TRIG 87 06/25/2018   CHOLHDL 3.2 06/25/2018     Depression screen PHQ 2/9 07/25/2018 06/25/2018 11/29/2017 10/01/2017 06/18/2017  Decreased Interest 0 0 0 0 0  Down, Depressed, Hopeless 0 0 0 0 1  PHQ - 2 Score 0 0 0 0 1  Altered sleeping 0 0 - - -  Tired, decreased energy 0 0 - - -  Change in appetite 0 0 - - -  Feeling bad or failure about yourself  0 0 - - -  Trouble concentrating 0 0 - - -  Moving slowly or fidgety/restless 0 0 - - -  Suicidal thoughts 0 0 - - -  PHQ-9 Score 0 0 - - -  Difficult doing work/chores Not difficult at all Not difficult at all - - -   Fall Risk  07/25/2018  06/25/2018 11/29/2017 10/01/2017 06/18/2017  Falls in the past year? 0 0 No No No  Number falls in past yr: 0 0 - - -  Injury with Fall? 0 0 - - -    Relevant past medical, surgical, family and social history reviewed Past Medical History:  Diagnosis Date  . Aortic atherosclerosis ([place]) 10/03/2016  . Centrilobular emphysema ([place]) 10/03/2016   Noted on CT scan 2017  . Chronic kidney disease   . CKD (chronic kidney disease) stage 2, GFR 60-89 ml/min 03/03/2015  . Constipation   . Depression   . Depression, major, recurrent, in partial remission ([place]) 03/03/2015  . GERD (gastroesophageal reflux disease)   . Hearing loss of both ears   . Hyperlipidemia   . Insomnia 12/05/2015  . Osteopenia 12/09/2015  . Personal history of tobacco use, presenting hazards to health 03/23/2015  . Skin cancer of face 2013   Left side of face resected.    Past Surgical History:  Procedure Laterality Date  . ABDOMINAL HYSTERECTOMY  1980   partial  . BACK SURGERY  1978  . COLONOSCOPY N/A 12/22/2014   Procedure: COLONOSCOPY;  Surgeon: Lucilla Lame, MD;  Location: [facility] ENDOSCOPY;  Service: Endoscopy;  Laterality: N/A;  . ELECTROMAGNETIC NAVIGATION BROCHOSCOPY N/A 04/15/2015   Procedure: ELECTROMAGNETIC NAVIGATION BRONCHOSCOPY;  Surgeon: Flora Lipps, MD;  Location: [facility] ORS;  Service: Cardiopulmonary;  Laterality: N/A;  . ENDOBRONCHIAL ULTRASOUND N/A 04/15/2015   Procedure: ENDOBRONCHIAL ULTRASOUND;  Surgeon: Flora Lipps, MD;  Location: [facility] ORS;  Service: Cardiopulmonary;  Laterality: N/A;  . ENDOBRONCHIAL ULTRASOUND N/A 06/15/2015   Procedure: ENDOBRONCHIAL ULTRASOUND;  Surgeon: Flora Lipps, MD;  Location: [facility] ORS;  Service: Cardiopulmonary;  Laterality: N/A;  . INNER EAR SURGERY Left 2013  . TONSILLECTOMY  11   Family History  Problem Relation Age of Onset  . Cancer Sister        liver  . Thyroid disease Father   . Heart disease Mother   . Hepatitis C Sister   . Thyroid disease Sister   . Cancer Brother 55        in muscle of leg  . Heart disease Brother   . Arthritis Brother   . COPD Sister   . Hypertension Sister   . Hyperlipidemia Sister   . Diabetes Maternal Grandmother   . Kidney disease Maternal Grandmother   . Arthritis Brother   . Breast cancer Maternal Aunt   . Breast cancer Maternal Aunt    Social History   Tobacco Use  . Smoking status: Current Every Day Smoker    Packs/day: 0.25    Years: 45.00    Pack years: 11.25    Types: Cigarettes  . Smokeless tobacco: Never Used  . Tobacco comment: only smoking 3-4 a day  Substance Use Topics  . Alcohol use: No    Alcohol/week: 0.0 standard drinks  . Drug use: No     Office Visit from 07/25/2018 in [facility]  AUDIT-C Score  0      Interim medical history since last visit reviewed. Allergies and medications reviewed  Review of Systems Per HPI unless specifically indicated above     Objective:    BP 128/72   Pulse 69   Temp 97.6 F (36.4 C) (Oral)   Ht '5\' 4"'  (1.626 m)   Wt 116 lb 12.8 oz (53 kg)   SpO2 97%   BMI 20.05 kg/m   Wt Readings from Last 3 Encounters:  07/25/18 116 lb 12.8 oz (53 kg)  06/25/18 115 lb 12.8 oz (52.5 kg)  01/24/18 100 lb 3.2 oz (45.5 kg)    Physical Exam Constitutional:      Appearance: She is well-developed.  Eyes:     General: No scleral icterus. Cardiovascular:     Rate and Rhythm: Normal rate and regular rhythm.  Pulmonary:     Effort: Pulmonary effort is normal.     Breath sounds: Normal breath sounds.  Psychiatric:        Behavior: Behavior normal.     Results for orders placed or performed in visit on 06/25/18  CBC with Differential/Platelet  Result Value Ref Range   WBC 8.3 3.8 - 10.8 Thousand/uL   RBC 4.08 3.80 - 5.10 Million/uL   Hemoglobin 12.2 11.7 - 15.5 g/dL   HCT 36.1 35.0 - 45.0 %   MCV 88.5 80.0 - 100.0 fL   MCH 29.9 27.0 - 33.0 pg   MCHC 33.8 32.0 - 36.0 g/dL   RDW 12.1 11.0 - 15.0 %   Platelets 203 140 - 400 Thousand/uL  MPV  11.1 7.5 - 12.5 fL   Neutro Abs 6,125 1,500 - 7,800 cells/uL   Lymphs Abs 1,610 850 - 3,900 cells/uL   WBC mixed population 515 200 - 950 cells/uL   Eosinophils Absolute 8 (L) 15 - 500 cells/uL   Basophils Absolute 42 0 - 200 cells/uL   Neutrophils Relative % 73.8 %   Total Lymphocyte 19.4 %   Monocytes Relative 6.2 %   Eosinophils Relative 0.1 %   Basophils Relative 0.5 %  COMPLETE METABOLIC PANEL WITH GFR  Result Value Ref Range   Glucose, Bld 102 (H) 65 - 99 mg/dL   BUN 15 7 - 25 mg/dL   Creat 1.00 (H) 0.50 - 0.99 mg/dL   GFR, Est Non African American 57 (L) > OR = 60 mL/min/1.19m   GFR, Est African American 67 > OR = 60 mL/min/1.766m  BUN/Creatinine Ratio 15 6 - 22 (calc)   Sodium 143 135 - 146 mmol/L   Potassium 3.7 3.5 - 5.3 mmol/L   Chloride 104 98 - 110 mmol/L   CO2 29 20 - 32 mmol/L   Calcium 10.1 8.6 - 10.4 mg/dL   Total Protein 6.7 6.1 - 8.1 g/dL   Albumin 4.4 3.6 - 5.1 g/dL   Globulin 2.3 1.9 - 3.7 g/dL (calc)   AG Ratio 1.9 1.0 - 2.5 (calc)   Total Bilirubin 0.9 0.2 - 1.2 mg/dL   Alkaline phosphatase (APISO) 93 33 - 130 U/L   AST 12 10 - 35 U/L   ALT 4 (L) 6 - 29 U/L  Lipid panel  Result Value Ref Range   Cholesterol 196 <200 mg/dL   HDL 61 >50 mg/dL   Triglycerides 87 <150 mg/dL   LDL Cholesterol (Calc) 116 (H) mg/dL (calc)   Total CHOL/HDL Ratio 3.2 <5.0 (calc)   Non-HDL Cholesterol (Calc) 135 (H) <130 mg/dL (calc)  TSH  Result Value Ref Range   TSH 0.48 0.40 - 4.50 mIU/L  Sed Rate (ESR)  Result Value Ref Range   Sed Rate 17 0 - 30 mm/h      Assessment & Plan:   Problem List Items Addressed This Visit      Cardiovascular and Mediastinum   Coronary artery disease    Reviewed the last note from Dr. CaClayborn Bignesscardiologist; proud of her for cutting back on her cigarettes; encouraged complete cessation; I gave her the number for the cardiologist and urged her to f/u with him and get that cardiac testing done ASAP; continue aspirin daily         Respiratory   Centrilobular emphysema ([facility]   Monitored by pulmonologist; encuraged smoking cessation        Other   Hyperlipidemia LDL goal <70    Continue the newer statin, check lipids in mid-January; limit saturated fats          Follow up plan: No follow-ups on file.  An after-visit summary was printed and given to the patient at [facility] Please see the patient instructions which may contain other information and recommendations beyond what is mentioned above in the assessment and plan.  No orders of the defined types were placed in this encounter.   No orders of the defined types were placed in this encounter.

## 2018-07-26 ENCOUNTER — Ambulatory Visit (INDEPENDENT_AMBULATORY_CARE_PROVIDER_SITE_OTHER): Payer: Medicare HMO

## 2018-07-26 DIAGNOSIS — Z23 Encounter for immunization: Secondary | ICD-10-CM

## 2018-08-12 ENCOUNTER — Other Ambulatory Visit: Payer: Self-pay

## 2018-08-12 ENCOUNTER — Encounter: Payer: Self-pay | Admitting: Emergency Medicine

## 2018-08-12 ENCOUNTER — Emergency Department
Admission: EM | Admit: 2018-08-12 | Discharge: 2018-08-12 | Disposition: A | Discharge status: Home / Self Care | Payer: Medicare HMO | Attending: Emergency Medicine | Admitting: Emergency Medicine

## 2018-08-12 DIAGNOSIS — Z79899 Other long term (current) drug therapy: Secondary | ICD-10-CM | POA: Insufficient documentation

## 2018-08-12 DIAGNOSIS — R112 Nausea with vomiting, unspecified: Secondary | ICD-10-CM | POA: Diagnosis not present

## 2018-08-12 DIAGNOSIS — I2510 Atherosclerotic heart disease of native coronary artery without angina pectoris: Secondary | ICD-10-CM | POA: Insufficient documentation

## 2018-08-12 DIAGNOSIS — Z85828 Personal history of other malignant neoplasm of skin: Secondary | ICD-10-CM | POA: Diagnosis not present

## 2018-08-12 DIAGNOSIS — Z7982 Long term (current) use of aspirin: Secondary | ICD-10-CM | POA: Diagnosis not present

## 2018-08-12 DIAGNOSIS — F329 Major depressive disorder, single episode, unspecified: Secondary | ICD-10-CM | POA: Diagnosis not present

## 2018-08-12 DIAGNOSIS — E860 Dehydration: Secondary | ICD-10-CM | POA: Diagnosis not present

## 2018-08-12 DIAGNOSIS — F17210 Nicotine dependence, cigarettes, uncomplicated: Secondary | ICD-10-CM | POA: Insufficient documentation

## 2018-08-12 DIAGNOSIS — J449 Chronic obstructive pulmonary disease, unspecified: Secondary | ICD-10-CM | POA: Diagnosis not present

## 2018-08-12 DIAGNOSIS — N182 Chronic kidney disease, stage 2 (mild): Secondary | ICD-10-CM | POA: Insufficient documentation

## 2018-08-12 LAB — URINALYSIS, COMPLETE (UACMP) WITH MICROSCOPIC
Bilirubin Urine: NEGATIVE
GLUCOSE, UA: NEGATIVE mg/dL
Ketones, ur: 5 mg/dL — AB
Nitrite: NEGATIVE
PH: 6 (ref 5.0–8.0)
PROTEIN: 100 mg/dL — AB
Specific Gravity, Urine: 1.019 (ref 1.005–1.030)

## 2018-08-12 LAB — CBC
HCT: 40.1 % (ref 36.0–46.0)
Hemoglobin: 13.7 g/dL (ref 12.0–15.0)
MCH: 29.7 pg (ref 26.0–34.0)
MCHC: 34.2 g/dL (ref 30.0–36.0)
MCV: 87 fL (ref 80.0–100.0)
PLATELETS: 247 10*3/uL (ref 150–400)
RBC: 4.61 MIL/uL (ref 3.87–5.11)
RDW: 12.3 % (ref 11.5–15.5)
WBC: 7.9 10*3/uL (ref 4.0–10.5)
nRBC: 0 % (ref 0.0–0.2)

## 2018-08-12 LAB — COMPREHENSIVE METABOLIC PANEL
ALBUMIN: 5 g/dL (ref 3.5–5.0)
ALK PHOS: 111 U/L (ref 38–126)
ALT: 14 U/L (ref 0–44)
AST: 22 U/L (ref 15–41)
Anion gap: 11 (ref 5–15)
BILIRUBIN TOTAL: 1.1 mg/dL (ref 0.3–1.2)
BUN: 29 mg/dL — AB (ref 8–23)
CO2: 28 mmol/L (ref 22–32)
Calcium: 10.5 mg/dL — ABNORMAL HIGH (ref 8.9–10.3)
Chloride: 101 mmol/L (ref 98–111)
Creatinine, Ser: 0.97 mg/dL (ref 0.44–1.00)
GFR calc Af Amer: >60 mL/min (ref >60)
GFR calc non Af Amer: 60 mL/min — ABNORMAL LOW (ref >60)
GLUCOSE: 116 mg/dL — AB (ref 70–99)
Potassium: 4 mmol/L (ref 3.5–5.1)
Sodium: 140 mmol/L (ref 135–145)
TOTAL PROTEIN: 8.2 g/dL — AB (ref 6.5–8.1)

## 2018-08-12 LAB — LIPASE, BLOOD: Lipase: 36 U/L (ref 11–51)

## 2018-08-12 MED ORDER — ONDANSETRON 4 MG PO TBDP: 4.0000 mg | ORAL_TABLET | Freq: Three times a day (TID) | ORAL | 0 refills | Status: DC | PRN

## 2018-08-12 MED ORDER — SODIUM CHLORIDE 0.9 % IV SOLN
1000.0000 mL | Freq: Once | INTRAVENOUS | Status: AC
  Administered 2018-08-12: 1000 mL via INTRAVENOUS

## 2018-08-12 MED ORDER — ONDANSETRON HCL 4 MG/2ML IJ SOLN
4.0000 mg | Freq: Once | INTRAMUSCULAR | Status: AC
  Administered 2018-08-12: 4 mg via INTRAVENOUS
  Filled 2018-08-12: qty 2

## 2018-08-12 NOTE — ED Provider Notes (Signed)
[facility] Emergency Department Provider Note   ____________________________________________    I have reviewed the triage vital signs and the nursing notes.   HISTORY  Chief Complaint Emesis     HPI Robin Jones is a 69 y.o. female who presents with complaints of nausea and vomiting which started approximately 24 hours ago.  Patient describes gradual onset headache yesterday which she notes that she gets sometimes which did not seem to improve with any medications.  She did not develop nausea and vomiting, mild soreness in her abdomen from vomiting but no abdominal pain currently.  Denies chest pain.  No shortness of breath.  Positive chills, no fevers reported.  Sick contacts, no recent travel  Past Medical History:  Diagnosis Date  . Aortic atherosclerosis ([place]) 10/03/2016  . Centrilobular emphysema ([place]) 10/03/2016   Noted on CT scan 2017  . Chronic kidney disease   . CKD (chronic kidney disease) stage 2, GFR 60-89 ml/min 03/03/2015  . Constipation   . Depression   . Depression, major, recurrent, in partial remission ([place]) 03/03/2015  . GERD (gastroesophageal reflux disease)   . Hearing loss of both ears   . Hyperlipidemia   . Insomnia 12/05/2015  . Osteopenia 12/09/2015  . Personal history of tobacco use, presenting hazards to health 03/23/2015  . Skin cancer of face 2013   Left side of face resected.     Patient Active Problem List   Diagnosis Date Noted  . Thyroid nodule 06/18/2017  . Underweight 05/08/2017  . Hyperthyroidism 05/08/2017  . Coronary artery disease 10/03/2016  . Aortic atherosclerosis ([place]) 10/03/2016  . Centrilobular emphysema ([place]) 10/03/2016  . HAV (hallux abducto valgus) 03/26/2016  . Bunion of right foot 03/10/2016  . Skin lesion of left leg 03/10/2016  . Senile purpura ([place]) 03/10/2016  . Need for hepatitis C screening test 02/09/2016  . Presbycusis of both ears 02/09/2016  . Osteopenia 12/09/2015  . Insomnia  12/05/2015  . Medication monitoring encounter 11/11/2015  . Adenopathy   . Lung mass   . Lung nodule seen on imaging study 03/29/2015  . Right flank pain 03/19/2015  . GERD without esophagitis 03/03/2015  . Depression, major, recurrent, in partial remission ([place]) 03/03/2015  . CKD (chronic kidney disease) stage 2, GFR 60-89 ml/min 03/03/2015  . COPD, mild ([place]) 03/03/2015  . Tobacco abuse 03/03/2015  . Chronic constipation 03/03/2015  . Osteoarthritis of multiple joints 03/03/2015  . At risk for falls 03/03/2015  . Thyroid mass of unclear etiology 03/03/2015  . Hyperlipidemia LDL goal <70 03/03/2015  . Need for pneumococcal vaccination 03/03/2015  . Polyp of colon, adenomatous 03/03/2015  . Skin lesion of face 03/03/2015    Past Surgical History:  Procedure Laterality Date  . ABDOMINAL HYSTERECTOMY  1980   partial  . BACK SURGERY  1978  . COLONOSCOPY N/A 12/22/2014   Procedure: COLONOSCOPY;  Surgeon: Lucilla Lame, MD;  Location: [facility] ENDOSCOPY;  Service: Endoscopy;  Laterality: N/A;  . ELECTROMAGNETIC NAVIGATION BROCHOSCOPY N/A 04/15/2015   Procedure: ELECTROMAGNETIC NAVIGATION BRONCHOSCOPY;  Surgeon: Flora Lipps, MD;  Location: [facility] ORS;  Service: Cardiopulmonary;  Laterality: N/A;  . ENDOBRONCHIAL ULTRASOUND N/A 04/15/2015   Procedure: ENDOBRONCHIAL ULTRASOUND;  Surgeon: Flora Lipps, MD;  Location: [facility] ORS;  Service: Cardiopulmonary;  Laterality: N/A;  . ENDOBRONCHIAL ULTRASOUND N/A 06/15/2015   Procedure: ENDOBRONCHIAL ULTRASOUND;  Surgeon: Flora Lipps, MD;  Location: [facility] ORS;  Service: Cardiopulmonary;  Laterality: N/A;  . INNER EAR SURGERY Left 2013  . TONSILLECTOMY  13    Prior to Admission medications   Medication Sig Start Date End Date Taking? Authorizing Provider  acetaminophen (TYLENOL) 325 MG tablet Take 650 mg by mouth every 6 (six) hours as needed for moderate pain or headache.    [provider]  albuterol (VENTOLIN HFA) 108 (90 Base) MCG/ACT inhaler Inhale  1-2 puffs into the lungs every 6 (six) hours as needed for wheezing or shortness of breath. 01/22/18   Poulose, Bethel Born, NP  aspirin EC 81 MG tablet Take 1 tablet (81 mg total) by mouth daily. 10/03/16   Arnetha Courser, MD  atorvastatin (LIPITOR) 40 MG tablet Take 1 tablet (40 mg total) by mouth at bedtime. For cholesterol; this replaces lovastatin 06/26/18   Lada, Satira Anis, MD  cholecalciferol (VITAMIN D) 1000 units tablet Take 1,000 Units by mouth daily.    [provider]  escitalopram (LEXAPRO) 10 MG tablet TAKE 1 TABLET EVERY DAY 06/06/18   Hubbard Hartshorn, FNP  hydrOXYzine (ATARAX/VISTARIL) 25 MG tablet Take 1 tablet (25 mg total) by mouth 3 (three) times daily as needed. 11/07/17   Arnetha Courser, MD  loratadine (CLARITIN) 10 MG tablet Take 10 mg daily by mouth.    [provider]  ondansetron (ZOFRAN ODT) 4 MG disintegrating tablet Take 1 tablet (4 mg total) by mouth every 8 (eight) hours as needed for nausea or vomiting. 08/12/18   Lavonia Drafts, MD  Tiotropium Bromide Monohydrate (SPIRIVA RESPIMAT) 2.5 MCG/ACT AERS Inhale 2 puffs daily into the lungs. 06/18/17   Arnetha Courser, MD  varenicline (CHANTIX CONTINUING MONTH PAK) 1 MG tablet Take 1 tablet (1 mg total) by mouth 2 (two) times daily. 06/25/18   Arnetha Courser, MD     Allergies Trelegy ellipta [fluticasone-umeclidin-vilant]  Family History  Problem Relation Age of Onset  . Cancer Sister        liver  . Thyroid disease Father   . Heart disease Mother   . Hepatitis C Sister   . Thyroid disease Sister   . Cancer Brother 55       in muscle of leg  . Heart disease Brother   . Arthritis Brother   . COPD Sister   . Hypertension Sister   . Hyperlipidemia Sister   . Diabetes Maternal Grandmother   . Kidney disease Maternal Grandmother   . Arthritis Brother   . Breast cancer Maternal Aunt   . Breast cancer Maternal Aunt     Social History Social History   Tobacco Use  . Smoking status: Current  Every Day Smoker    Packs/day: 0.25    Years: 45.00    Pack years: 11.25    Types: Cigarettes  . Smokeless tobacco: Never Used  . Tobacco comment: only smoking 3-4 a day  Substance Use Topics  . Alcohol use: No    Alcohol/week: 0.0 standard drinks  . Drug use: No    Review of Systems  Constitutional: As above Eyes: No visual changes.  ENT: No sore throat. Cardiovascular: Denies chest pain. Respiratory: Denies shortness of breath. Gastrointestinal: As above Genitourinary: Negative for dysuria. Musculoskeletal: Negative for back pain. Skin: Negative for rash. Neurological: Negative for weakness, positive headache global, throbbing   ____________________________________________   PHYSICAL EXAM:  VITAL SIGNS: ED Triage Vitals  Enc Vitals Group     BP 08/12/18 1255 (!) 142/71     Pulse Rate 08/12/18 1255 78     Resp 08/12/18 1255 20     Temp  08/12/18 1255 97.9 F (36.6 C)     Temp Source 08/12/18 1255 Oral     SpO2 08/12/18 1255 100 %     Weight 08/12/18 1253 52.6 kg (116 lb)     Height 08/12/18 1253 1.626 m (5\' 4" )     Head Circumference --      Peak Flow --      Pain Score 08/12/18 1253 6     Pain Loc --      Pain Edu? --      Excl. in [place]? --     Constitutional: Alert and oriented.  Eyes: Conjunctivae are normal.,  EOMI  Nose: No congestion/rhinnorhea. Mouth/Throat: Mucous membranes are dry Neck:  Painless ROM Cardiovascular: Normal rate, regular rhythm. Grossly normal heart sounds.  Good peripheral circulation. Respiratory: Normal respiratory effort.  No retractions. Lungs CTAB. Gastrointestinal: Soft and nontender. No distention.  No CVA tenderness.  Musculoskeletal:   Warm and well perfused Neurologic:  Normal speech and language. No gross focal neurologic deficits are appreciated.  Skin:  Skin is warm, dry and intact. No rash noted. Psychiatric: Mood and affect are normal. Speech and behavior are  normal.  ____________________________________________   LABS (all labs ordered are listed, but only abnormal results are displayed)  Labs Reviewed  COMPREHENSIVE METABOLIC PANEL - Abnormal; Notable for the following components:      Result Value   Glucose, Bld 116 (*)    BUN 29 (*)    Calcium 10.5 (*)    Total Protein 8.2 (*)    GFR calc non Af Amer 60 (*)    All other components within normal limits  URINALYSIS, COMPLETE (UACMP) WITH MICROSCOPIC - Abnormal; Notable for the following components:   Color, Urine AMBER (*)    APPearance CLOUDY (*)    Hgb urine dipstick MODERATE (*)    Ketones, ur 5 (*)    Protein, ur 100 (*)    Leukocytes, UA TRACE (*)    Bacteria, UA MANY (*)    All other components within normal limits  LIPASE, BLOOD  CBC   ____________________________________________  EKG  None ____________________________________________  RADIOLOGY   ____________________________________________   PROCEDURES  Procedure(s) performed: No  Procedures   Critical Care performed: No ____________________________________________   INITIAL IMPRESSION / ASSESSMENT AND PLAN / ED COURSE  Pertinent labs & imaging results that were available during my care of the patient were reviewed by me and considered in my medical decision making (see chart for details).  Presents with nausea vomiting, mild headache.  Appears mildly dehydrated.  Lab work overall reassuring consistent with mild dehydration.  We will give IV fluids, IV Zofran to see if this improves her symptoms, will reevaluate.  Suspect either viral gastroenteritis or nausea related to migraine headache   On reevaluation the patient reports feeling much better, her nausea has improved, she reports her headache has resolved .  Urinalysis appears contaminated, the patient denies dysuria  ____________________________________________   FINAL CLINICAL IMPRESSION(S) / ED DIAGNOSES  Final diagnoses:  Dehydration   Nausea and vomiting, intractability of vomiting not specified, unspecified vomiting type        Note:  This document was prepared using Dragon voice recognition software and may include unintentional dictation errors.   Lavonia Drafts, MD 08/12/18 [phone]

## 2018-08-12 NOTE — ED Triage Notes (Signed)
Pt c/o sudden onset N/V since yesterday morning. Pt states has been unable to keep food down since then. Pt ambulatory without difficulty at this time.

## 2018-08-12 NOTE — ED Notes (Signed)
Gave pt urine cup with bag for urine specimen.

## 2018-08-13 DIAGNOSIS — E785 Hyperlipidemia, unspecified: Secondary | ICD-10-CM | POA: Diagnosis not present

## 2018-08-14 ENCOUNTER — Other Ambulatory Visit: Payer: Self-pay | Admitting: Family Medicine

## 2018-08-14 DIAGNOSIS — I25119 Atherosclerotic heart disease of native coronary artery with unspecified angina pectoris: Secondary | ICD-10-CM

## 2018-08-14 DIAGNOSIS — E785 Hyperlipidemia, unspecified: Secondary | ICD-10-CM

## 2018-08-14 DIAGNOSIS — I700 Atherosclerosis of aorta: Secondary | ICD-10-CM

## 2018-08-14 LAB — LIPID PANEL
CHOLESTEROL: 178 mg/dL (ref <200)
HDL: 49 mg/dL — ABNORMAL LOW (ref >50)
LDL CHOLESTEROL (CALC): 103 mg/dL — AB
Non-HDL Cholesterol (Calc): 129 mg/dL (calc) (ref <130)
TRIGLYCERIDES: 156 mg/dL — AB (ref <150)
Total CHOL/HDL Ratio: 3.6 (calc) (ref <5.0)

## 2018-08-14 MED ORDER — ATORVASTATIN CALCIUM 80 MG PO TABS: 80.0000 mg | ORAL_TABLET | Freq: Every day | ORAL | 1 refills | Status: DC

## 2018-09-10 ENCOUNTER — Ambulatory Visit (INDEPENDENT_AMBULATORY_CARE_PROVIDER_SITE_OTHER): Payer: Medicare HMO

## 2018-09-10 VITALS — BP 132/82 | HR 66 | Temp 97.5°F | Resp 61 | Ht 64.0 in | Wt 115.6 lb

## 2018-09-10 DIAGNOSIS — Z780 Asymptomatic menopausal state: Secondary | ICD-10-CM

## 2018-09-10 DIAGNOSIS — Z0000 Encounter for general adult medical examination without abnormal findings: Secondary | ICD-10-CM

## 2018-09-10 DIAGNOSIS — Z1231 Encounter for screening mammogram for malignant neoplasm of breast: Secondary | ICD-10-CM

## 2018-09-10 NOTE — Patient Instructions (Addendum)
Ms. Robin Jones , Thank you for taking time to come for your Medicare Wellness Visit. I appreciate your ongoing commitment to your health goals. Please review the following plan we discussed and let me know if I can assist you in the future.   Screening recommendations/referrals: Colonoscopy: done 12/22/14 due 2021 Mammogram: done 11/26/17. Please call [phone] to schedule your mammogram and bone density screening.  Bone Density: done 12/08/15 Recommended yearly ophthalmology/optometry visit for glaucoma screening and checkup Recommended yearly dental visit for hygiene and checkup  Vaccinations: Influenza vaccine: done 07/26/18 Pneumococcal vaccine: done 10/03/16 Tdap vaccine: done 03/03/15 Shingles vaccine: Shingrix discussed. Please contact your pharmacy for coverage information.     Advanced directives: Advance directive discussed with you today. Even though you declined this today please call our office should you change your mind and we can give you the proper paperwork for you to fill out.  Conditions/risks identified: Recommend drinking 6-8 glasses of water per day.   If you wish to quit smoking, help is available. For free tobacco cessation program offerings call the [facility] at [phone] or Live Well Line at 808-477-3857. You may also visit [email or web address] or email [email or web address] for more information on other programs.    Next appointment: Please follow up in one year for your Medicare Annual Wellness visit.   Please do call Dr. Etta Quill office to schedule an appointment Phone: [phone]    Preventive Care 65 Years and Older, Female Preventive care refers to lifestyle choices and visits with your health care provider that can promote health and wellness. What does preventive care include?  A yearly physical exam. This is also called an annual well check.  Dental exams once or twice a year.  Routine eye exams. Ask your health care  provider how often you should have your eyes checked.  Personal lifestyle choices, including:  Daily care of your teeth and gums.  Regular physical activity.  Eating a healthy diet.  Avoiding tobacco and drug use.  Limiting alcohol use.  Practicing safe sex.  Taking low-dose aspirin every day.  Taking vitamin and mineral supplements as recommended by your health care provider. What happens during an annual well check? The services and screenings done by your health care provider during your annual well check will depend on your age, overall health, lifestyle risk factors, and family history of disease. Counseling  Your health care provider may ask you questions about your:  Alcohol use.  Tobacco use.  Drug use.  Emotional well-being.  Home and relationship well-being.  Sexual activity.  Eating habits.  History of falls.  Memory and ability to understand (cognition).  Work and work Statistician.  Reproductive health. Screening  You may have the following tests or measurements:  Height, weight, and BMI.  Blood pressure.  Lipid and cholesterol levels. These may be checked every 5 years, or more frequently if you are over 23 years old.  Skin check.  Lung cancer screening. You may have this screening every year starting at age 63 if you have a 30-pack-year history of smoking and currently smoke or have quit within the past 15 years.  Fecal occult blood test (FOBT) of the stool. You may have this test every year starting at age 21.  Flexible sigmoidoscopy or colonoscopy. You may have a sigmoidoscopy every 5 years or a colonoscopy every 10 years starting at age 70.  Hepatitis C blood test.  Hepatitis B blood test.  Sexually transmitted disease (STD) testing.  Diabetes screening.  This is done by checking your blood sugar (glucose) after you have not eaten for a while (fasting). You may have this done every 1-3 years.  Bone density scan. This is done to  screen for osteoporosis. You may have this done starting at age 73.  Mammogram. This may be done every 1-2 years. Talk to your health care provider about how often you should have regular mammograms. Talk with your health care provider about your test results, treatment options, and if necessary, the need for more tests. Vaccines  Your health care provider may recommend certain vaccines, such as:  Influenza vaccine. This is recommended every year.  Tetanus, diphtheria, and acellular pertussis (Tdap, Td) vaccine. You may need a Td booster every 10 years.  Zoster vaccine. You may need this after age 78.  Pneumococcal 13-valent conjugate (PCV13) vaccine. One dose is recommended after age 82.  Pneumococcal polysaccharide (PPSV23) vaccine. One dose is recommended after age 28. Talk to your health care provider about which screenings and vaccines you need and how often you need them. This information is not intended to replace advice given to you by your health care provider. Make sure you discuss any questions you have with your health care provider. Document Released: 08/20/2015 Document Revised: 04/12/2016 Document Reviewed: 05/25/2015 Elsevier Interactive Patient Education  2017 [place] Prevention in the Home Falls can cause injuries. They can happen to people of all ages. There are many things you can do to make your home safe and to help prevent falls. What can I do on the outside of my home?  Regularly fix the edges of walkways and driveways and fix any cracks.  Remove anything that might make you trip as you walk through a door, such as a raised step or threshold.  Trim any bushes or trees on the path to your home.  Use bright outdoor lighting.  Clear any walking paths of anything that might make someone trip, such as rocks or tools.  Regularly check to see if handrails are loose or broken. Make sure that both sides of any steps have handrails.  Any raised decks  and porches should have guardrails on the edges.  Have any leaves, snow, or ice cleared regularly.  Use sand or salt on walking paths during winter.  Clean up any spills in your garage right away. This includes oil or grease spills. What can I do in the bathroom?  Use night lights.  Install grab bars by the toilet and in the tub and shower. Do not use towel bars as grab bars.  Use non-skid mats or decals in the tub or shower.  If you need to sit down in the shower, use a plastic, non-slip stool.  Keep the floor dry. Clean up any water that spills on the floor as soon as it happens.  Remove soap buildup in the tub or shower regularly.  Attach bath mats securely with double-sided non-slip rug tape.  Do not have throw rugs and other things on the floor that can make you trip. What can I do in the bedroom?  Use night lights.  Make sure that you have a light by your bed that is easy to reach.  Do not use any sheets or blankets that are too big for your bed. They should not hang down onto the floor.  Have a firm chair that has side arms. You can use this for support while you get dressed.  Do not have throw rugs and  other things on the floor that can make you trip. What can I do in the kitchen?  Clean up any spills right away.  Avoid walking on wet floors.  Keep items that you use a lot in easy-to-reach places.  If you need to reach something above you, use a strong step stool that has a grab bar.  Keep electrical cords out of the way.  Do not use floor polish or wax that makes floors slippery. If you must use wax, use non-skid floor wax.  Do not have throw rugs and other things on the floor that can make you trip. What can I do with my stairs?  Do not leave any items on the stairs.  Make sure that there are handrails on both sides of the stairs and use them. Fix handrails that are broken or loose. Make sure that handrails are as long as the stairways.  Check any  carpeting to make sure that it is firmly attached to the stairs. Fix any carpet that is loose or worn.  Avoid having throw rugs at the top or bottom of the stairs. If you do have throw rugs, attach them to the floor with carpet tape.  Make sure that you have a light switch at the top of the stairs and the bottom of the stairs. If you do not have them, ask someone to add them for you. What else can I do to help prevent falls?  Wear shoes that:  Do not have high heels.  Have rubber bottoms.  Are comfortable and fit you well.  Are closed at the toe. Do not wear sandals.  If you use a stepladder:  Make sure that it is fully opened. Do not climb a closed stepladder.  Make sure that both sides of the stepladder are locked into place.  Ask someone to hold it for you, if possible.  Clearly mark and make sure that you can see:  Any grab bars or handrails.  First and last steps.  Where the edge of each step is.  Use tools that help you move around (mobility aids) if they are needed. These include:  Canes.  Walkers.  Scooters.  Crutches.  Turn on the lights when you go into a dark area. Replace any light bulbs as soon as they burn out.  Set up your furniture so you have a clear path. Avoid moving your furniture around.  If any of your floors are uneven, fix them.  If there are any pets around you, be aware of where they are.  Review your medicines with your doctor. Some medicines can make you feel dizzy. This can increase your chance of falling. Ask your doctor what other things that you can do to help prevent falls. This information is not intended to replace advice given to you by your health care provider. Make sure you discuss any questions you have with your health care provider. Document Released: 05/20/2009 Document Revised: 12/30/2015 Document Reviewed: 08/28/2014 Elsevier Interactive Patient Education  2017 [employer].

## 2018-09-10 NOTE — Progress Notes (Signed)
Subjective:   Robin Jones is a 68 y.o. female who presents for Medicare Annual (Subsequent) preventive examination.  Review of Systems:  Cardiac Risk Factors include: advanced age (>81men, >16 women);smoking/ tobacco exposure;dyslipidemia     Objective:     Vitals: BP 132/82 (BP Location: Right Arm, Patient Position: Sitting, Cuff Size: Normal)   Pulse 66   Temp (!) 97.5 F (36.4 C) (Oral)   Resp (!) 61   Ht 5\' 4"  (1.626 m)   Wt 115 lb 9.6 oz (52.4 kg)   SpO2 98%   BMI 19.84 kg/m   Body mass index is 19.84 kg/m.  Advanced Directives 09/10/2018 08/12/2018 08/12/2018 05/07/2017 10/03/2016 09/11/2016 05/10/2016  Does Patient Have a Medical Advance Directive? No No No No No No No  Would patient like information on creating a medical advance directive? No - Patient declined No - Patient declined No - Patient declined - - - No - patient declined information    Tobacco Social History   Tobacco Use  Smoking Status Current Every Day Smoker  . Packs/day: 0.25  . Years: 45.00  . Pack years: 11.25  . Types: Cigarettes  Smokeless Tobacco Never Used  Tobacco Comment   only smoking 3-4 a day, trying to quit     Ready to quit: Not Answered Counseling given: Not Answered Comment: only smoking 3-4 a day, trying to quit   Clinical Intake:  Pre-visit preparation completed: Yes  Pain : No/denies pain     BMI - recorded: 19.84 Nutritional Status: BMI of 19-24  Normal Nutritional Risks: None Diabetes: No  How often do you need to have someone help you when you read instructions, pamphlets, or other written materials from your doctor or pharmacy?: 1 - Never What is the last grade level you completed in school?: 8th grade  Interpreter Needed?: No  Information entered by :: Clemetine Marker LPN  Past Medical History:  Diagnosis Date  . Aortic atherosclerosis ([place]) 10/03/2016  . Centrilobular emphysema ([place]) 10/03/2016   Noted on CT scan 2017  . Chronic kidney disease   . CKD (chronic  kidney disease) stage 2, GFR 60-89 ml/min 03/03/2015  . Constipation   . Depression   . Depression, major, recurrent, in partial remission ([place]) 03/03/2015  . GERD (gastroesophageal reflux disease)   . Hearing loss of both ears   . Hyperlipidemia   . Insomnia 12/05/2015  . Osteopenia 12/09/2015  . Personal history of tobacco use, presenting hazards to health 03/23/2015  . Skin cancer of face 2013   Left side of face resected.    Past Surgical History:  Procedure Laterality Date  . ABDOMINAL HYSTERECTOMY  1980   partial  . BACK SURGERY  1978  . COLONOSCOPY N/A 12/22/2014   Procedure: COLONOSCOPY;  Surgeon: Lucilla Lame, MD;  Location: [facility];  Service: Endoscopy;  Laterality: N/A;  . ELECTROMAGNETIC NAVIGATION BROCHOSCOPY N/A 04/15/2015   Procedure: ELECTROMAGNETIC NAVIGATION BRONCHOSCOPY;  Surgeon: Flora Lipps, MD;  Location: [facility] ORS;  Service: Cardiopulmonary;  Laterality: N/A;  . ENDOBRONCHIAL ULTRASOUND N/A 04/15/2015   Procedure: ENDOBRONCHIAL ULTRASOUND;  Surgeon: Flora Lipps, MD;  Location: [facility] ORS;  Service: Cardiopulmonary;  Laterality: N/A;  . ENDOBRONCHIAL ULTRASOUND N/A 06/15/2015   Procedure: ENDOBRONCHIAL ULTRASOUND;  Surgeon: Flora Lipps, MD;  Location: [facility] ORS;  Service: Cardiopulmonary;  Laterality: N/A;  . INNER EAR SURGERY Left 2013  . TONSILLECTOMY  11   Family History  Problem Relation Age of Onset  . Cancer Sister  liver  . Thyroid disease Father   . Heart disease Mother   . Hepatitis C Sister   . Thyroid disease Sister   . Cancer Brother 55       in muscle of leg  . Heart disease Brother   . Arthritis Brother   . COPD Sister   . Hypertension Sister   . Hyperlipidemia Sister   . Diabetes Maternal Grandmother   . Kidney disease Maternal Grandmother   . Arthritis Brother   . Breast cancer Maternal Aunt   . Breast cancer Maternal Aunt    Social History   Socioeconomic History  . Marital status: Divorced    Spouse name: Not on file  . Number  of children: 1  . Years of education: Not on file  . Highest education level: 8th grade  Occupational History  . Not on file  Social Needs  . Financial resource strain: Not hard at all  . Food insecurity:    Worry: Never true    Inability: Never true  . Transportation needs:    Medical: No    Non-medical: No  Tobacco Use  . Smoking status: Current Every Day Smoker    Packs/day: 0.25    Years: 45.00    Pack years: 11.25    Types: Cigarettes  . Smokeless tobacco: Never Used  . Tobacco comment: only smoking 3-4 a day, trying to quit  Substance and Sexual Activity  . Alcohol use: No    Alcohol/week: 0.0 standard drinks  . Drug use: No  . Sexual activity: Not Currently  Lifestyle  . Physical activity:    Days per week: 0 days    Minutes per session: 0 min  . Stress: Only a little  Relationships  . Social connections:    Talks on phone: More than three times a week    Gets together: Three times a week    Attends religious service: Never    Active member of club or organization: No    Attends meetings of clubs or organizations: Never    Relationship status: Divorced  Other Topics Concern  . Not on file  Social History Narrative  . Not on file    Outpatient Encounter Medications as of 09/10/2018  Medication Sig  . acetaminophen (TYLENOL) 325 MG tablet Take 650 mg by mouth every 6 (six) hours as needed for moderate pain or headache.  . albuterol (VENTOLIN HFA) 108 (90 Base) MCG/ACT inhaler Inhale 1-2 puffs into the lungs every 6 (six) hours as needed for wheezing or shortness of breath.  Marland Kitchen aspirin EC 81 MG tablet Take 1 tablet (81 mg total) by mouth daily.  Marland Kitchen atorvastatin (LIPITOR) 80 MG tablet Take 1 tablet (80 mg total) by mouth at bedtime.  . cholecalciferol (VITAMIN D) 1000 units tablet Take 1,000 Units by mouth daily.  Marland Kitchen escitalopram (LEXAPRO) 10 MG tablet TAKE 1 TABLET EVERY DAY  . fluticasone (FLONASE) 50 MCG/ACT nasal spray   . loratadine (CLARITIN) 10 MG tablet  Take 10 mg daily by mouth.  . ondansetron (ZOFRAN ODT) 4 MG disintegrating tablet Take 1 tablet (4 mg total) by mouth every 8 (eight) hours as needed for nausea or vomiting.  . Tiotropium Bromide Monohydrate (SPIRIVA RESPIMAT) 2.5 MCG/ACT AERS Inhale 2 puffs daily into the lungs.  . hydrOXYzine (ATARAX/VISTARIL) 25 MG tablet Take 1 tablet (25 mg total) by mouth 3 (three) times daily as needed. (Patient not taking: Reported on 09/10/2018)  . varenicline (CHANTIX CONTINUING MONTH PAK) 1 MG  tablet Take 1 tablet (1 mg total) by mouth 2 (two) times daily. (Patient not taking: Reported on 09/10/2018)   Facility-Administered Encounter Medications as of 09/10/2018  Medication  . albuterol (PROVENTIL) (2.5 MG/3ML) 0.083% nebulizer solution 2.5 mg    Activities of Daily Living In your present state of health, do you have any difficulty performing the following activities: 09/10/2018 07/25/2018  Hearing? Y N  Comment wears hearing aids -  Vision? N N  Comment wears glasses -  Difficulty concentrating or making decisions? N N  Walking or climbing stairs? N N  Dressing or bathing? N N  Doing errands, shopping? N N  Preparing Food and eating ? N -  Using the Toilet? N -  In the past six months, have you accidently leaked urine? N -  Do you have problems with loss of bowel control? N -  Managing your Medications? N -  Managing your Finances? N -  Housekeeping or managing your Housekeeping? N -  Some recent data might be hidden    Patient Care Team: Lada, Satira Anis, MD as PCP - General (Family Medicine) Nestor Lewandowsky, MD as Referring Physician (Cardiothoracic Surgery) Clyde Canterbury, MD as Referring Physician (Otolaryngology) Yolonda Kida, MD as Consulting Physician (Cardiology)    Assessment:   This is a routine wellness examination for Nyaira.  Exercise Activities and Dietary recommendations Current Exercise Habits: The patient does not participate in regular exercise at present  Goals    .  DIET - INCREASE WATER INTAKE     Recommend drinking 6-8 glasses of water per day.        Fall Risk Fall Risk  09/10/2018 07/25/2018 06/25/2018 11/29/2017 10/01/2017  Falls in the past year? 0 0 0 No No  Number falls in past yr: 0 0 0 - -  Injury with Fall? 0 0 0 - -  Follow up Falls prevention discussed - - - -   FALL RISK PREVENTION PERTAINING TO THE HOME:  Any stairs in or around the home WITH handrails? No  Home free of loose throw rugs in walkways, pet beds, electrical cords, etc? Yes  Adequate lighting in your home to reduce risk of falls? Yes   ASSISTIVE DEVICES UTILIZED TO PREVENT FALLS:  Life alert? No  Use of a cane, walker or w/c? No  Grab bars in the bathroom? Yes  Shower chair or bench in shower? Yes  Elevated toilet seat or a handicapped toilet? No   DME ORDERS:  DME order needed?  No   TIMED UP AND GO:  Was the test performed? Yes .  Length of time to ambulate 10 feet: 5 sec.   GAIT:  Appearance of gait: Gait stead-fast and without the use of an assistive device.  Education: Fall risk prevention has been discussed.  Intervention(s) required? No   Depression Screen PHQ 2/9 Scores 09/10/2018 07/25/2018 06/25/2018 11/29/2017  PHQ - 2 Score 0 0 0 0  PHQ- 9 Score 5 0 0 -     Cognitive Function     6CIT Screen 09/10/2018  What Year? 0 points  What month? 0 points  What time? 0 points  Count back from 20 0 points  Months in reverse 4 points  Repeat phrase 2 points  Total Score 6    Immunization History  Administered Date(s) Administered  . Influenza, High Dose Seasonal PF 09/11/2016, 05/07/2017, 07/26/2018  . Influenza,inj,Quad PF,6+ Mos 06/22/2015  . Pneumococcal Conjugate-13 03/03/2015  . Pneumococcal Polysaccharide-23 10/03/2016  . Tdap  03/03/2015    Qualifies for Shingles Vaccine? Yes . Due for Shingrix. Education has been provided regarding the importance of this vaccine. Pt has been advised to call insurance company to determine out of pocket  expense. Advised may also receive vaccine at local pharmacy or Health Dept. Verbalized acceptance and understanding.  Tdap: Up to date  Flu Vaccine: Up to date  Pneumococcal Vaccine: Up to date    Screening Tests Health Maintenance  Topic Date Due  . MAMMOGRAM  11/27/2018  . COLONOSCOPY  12/22/2019  . TETANUS/TDAP  03/02/2025  . INFLUENZA VACCINE  Completed  . DEXA SCAN  Completed  . Hepatitis C Screening  Completed  . PNA vac Low Risk Adult  Completed   Cancer Screenings:  Colorectal Screening: Completed 12/22/14. Repeat every 5 years  Mammogram: Completed 11/26/17. Repeat every year; No longer required. Ordered today. Pt provided with contact information and advised to call to schedule appt.   Bone Density: Completed 5/317. Results reflect OSTEOPENIA. Repeat every 2 years. Ordered today. Pt provided with contact information and advised to call to schedule appt.   Lung Cancer Screening: (Low Dose CT Chest recommended if Age 43-80 years, 30 pack-year currently smoking OR have quit w/in 15years.) does not qualify. Chest CT scan done 11/2017.   Additional Screening:  Hepatitis C Screening: does qualify; Completed 12/08/15  Vision Screening: Recommended annual ophthalmology exams for early detection of glaucoma and other disorders of the eye. Is the patient up to date with their annual eye exam?  Yes  Who is the provider or what is the name of the office in which the pt attends annual eye exams? Dr. Marvel Plan   Dental Screening: Recommended annual dental exams for proper oral hygiene  Community Resource Referral:  CRR required this visit?  No      Plan:      I have personally reviewed and addressed the Medicare Annual Wellness questionnaire and have noted the following in the patient's chart:  A. Medical and social history B. Use of alcohol, tobacco or illicit drugs  C. Current medications and supplements D. Functional ability and status E.  Nutritional status F.    Physical activity G. Advance directives H. List of other physicians I.  Hospitalizations, surgeries, and ER visits in previous 12 months J.  [place] such as hearing and vision if needed, cognitive and depression L. Referrals and appointments   In addition, I have reviewed and discussed with patient certain preventive protocols, quality metrics, and best practice recommendations. A written personalized care plan for preventive services as well as general preventive health recommendations were provided to patient.   Signed,  Clemetine Marker, LPN Nurse Health Advisor   Nurse Notes:pt doing well and appreciative of visit today

## 2018-09-13 DIAGNOSIS — F17200 Nicotine dependence, unspecified, uncomplicated: Secondary | ICD-10-CM | POA: Diagnosis not present

## 2018-09-13 DIAGNOSIS — C91Z0 Other lymphoid leukemia not having achieved remission: Secondary | ICD-10-CM | POA: Diagnosis not present

## 2018-09-13 DIAGNOSIS — I208 Other forms of angina pectoris: Secondary | ICD-10-CM | POA: Diagnosis not present

## 2018-09-13 DIAGNOSIS — R0602 Shortness of breath: Secondary | ICD-10-CM | POA: Diagnosis not present

## 2018-09-13 DIAGNOSIS — J329 Chronic sinusitis, unspecified: Secondary | ICD-10-CM | POA: Diagnosis not present

## 2018-09-13 DIAGNOSIS — R011 Cardiac murmur, unspecified: Secondary | ICD-10-CM | POA: Diagnosis not present

## 2018-09-13 DIAGNOSIS — E782 Mixed hyperlipidemia: Secondary | ICD-10-CM | POA: Diagnosis not present

## 2018-09-19 DIAGNOSIS — H5203 Hypermetropia, bilateral: Secondary | ICD-10-CM | POA: Diagnosis not present

## 2018-09-19 DIAGNOSIS — H40003 Preglaucoma, unspecified, bilateral: Secondary | ICD-10-CM | POA: Diagnosis not present

## 2018-10-02 DIAGNOSIS — R0602 Shortness of breath: Secondary | ICD-10-CM | POA: Diagnosis not present

## 2018-10-02 DIAGNOSIS — I208 Other forms of angina pectoris: Secondary | ICD-10-CM | POA: Diagnosis not present

## 2018-10-26 ENCOUNTER — Encounter: Payer: Self-pay | Admitting: *Deleted

## 2018-10-31 ENCOUNTER — Encounter: Payer: Self-pay | Admitting: *Deleted

## 2018-11-09 ENCOUNTER — Other Ambulatory Visit: Payer: Self-pay | Admitting: Family Medicine

## 2018-11-09 DIAGNOSIS — F3341 Major depressive disorder, recurrent, in partial remission: Secondary | ICD-10-CM

## 2018-11-12 ENCOUNTER — Other Ambulatory Visit: Payer: Self-pay | Admitting: Family Medicine

## 2018-11-12 MED ORDER — ATORVASTATIN CALCIUM 80 MG PO TABS: 80.0000 mg | ORAL_TABLET | Freq: Every day | ORAL | 1 refills | Status: DC

## 2018-12-11 ENCOUNTER — Other Ambulatory Visit: Payer: Medicare HMO

## 2019-01-08 ENCOUNTER — Telehealth: Payer: Self-pay | Admitting: *Deleted

## 2019-01-08 NOTE — Telephone Encounter (Signed)
Left message for patient to notify them that it is time to schedule annual low dose lung cancer screening CT scan. Instructed patient to call back to verify information prior to the scan being scheduled.  

## 2019-01-24 ENCOUNTER — Telehealth: Payer: Self-pay

## 2019-01-24 NOTE — Telephone Encounter (Signed)
Call pt regarding lung screening. Per pt husband pt was sleeping. He will get pt to call back later.

## 2019-03-25 DIAGNOSIS — R6889 Other general symptoms and signs: Secondary | ICD-10-CM | POA: Diagnosis not present

## 2019-04-16 ENCOUNTER — Telehealth: Payer: Self-pay | Admitting: *Deleted

## 2019-04-16 NOTE — Telephone Encounter (Signed)
Left message for patient to notify them that it is time to schedule annual low dose lung cancer screening CT scan. Instructed patient to call back to verify information prior to the scan being scheduled.  

## 2019-04-22 ENCOUNTER — Encounter: Payer: Self-pay | Admitting: *Deleted

## 2019-05-12 ENCOUNTER — Telehealth: Payer: Self-pay | Admitting: *Deleted

## 2019-05-12 DIAGNOSIS — Z122 Encounter for screening for malignant neoplasm of respiratory organs: Secondary | ICD-10-CM

## 2019-05-12 DIAGNOSIS — Z87891 Personal history of nicotine dependence: Secondary | ICD-10-CM

## 2019-05-12 NOTE — Telephone Encounter (Signed)
Patient has been notified that annual lung cancer screening low dose CT scan is due currently or will be in near future. Confirmed that patient is within the age range of 55-77, and asymptomatic, (no signs or symptoms of lung cancer). Patient denies illness that would prevent curative treatment for lung cancer if found. Verified smoking history, (current, 46 pack year). The shared decision making visit was done 03/26/15. Patient is agreeable for CT scan being scheduled.

## 2019-05-15 ENCOUNTER — Other Ambulatory Visit: Payer: Self-pay

## 2019-05-15 ENCOUNTER — Ambulatory Visit
Admission: RE | Admit: 2019-05-15 | Discharge: 2019-05-15 | Disposition: A | Discharge status: Home / Self Care | Payer: Medicare HMO | Source: Ambulatory Visit | Attending: Oncology | Admitting: Oncology

## 2019-05-15 DIAGNOSIS — Z122 Encounter for screening for malignant neoplasm of respiratory organs: Secondary | ICD-10-CM | POA: Diagnosis not present

## 2019-05-15 DIAGNOSIS — Z87891 Personal history of nicotine dependence: Secondary | ICD-10-CM | POA: Insufficient documentation

## 2019-05-15 DIAGNOSIS — F17210 Nicotine dependence, cigarettes, uncomplicated: Secondary | ICD-10-CM | POA: Diagnosis not present

## 2019-05-16 ENCOUNTER — Telehealth: Payer: Self-pay | Admitting: *Deleted

## 2019-05-16 NOTE — Telephone Encounter (Signed)
Called report  IMPRESSION: 1. Lung-RADS 4BS, suspicious. Additional imaging evaluation or consultation with Pulmonology or Thoracic Surgery recommended. 2. The "S" modifier above refers to potentially clinically significant non lung cancer related findings. Specifically, there is aortic atherosclerosis, in addition to left main and 2 vessel coronary artery disease. Please note that although the presence of coronary artery calcium documents the presence of coronary artery disease, the severity of this disease and any potential stenosis cannot be assessed on this non-gated CT examination. Assessment for potential risk factor modification, dietary therapy or pharmacologic therapy may be warranted, if clinically indicated. 3. Mild diffuse bronchial wall thickening with mild centrilobular and paraseptal emphysema; imaging findings suggestive of underlying COPD.  These results will be called to the ordering clinician or representative by the Radiologist Assistant, and communication documented in the PACS or zVision Dashboard.  Aortic Atherosclerosis (ICD10-I70.0) and Emphysema (ICD10-J43.9).   Electronically Signed   By: Vinnie Langton M.D.   On: 05/15/2019 16:44

## 2019-05-20 ENCOUNTER — Telehealth: Payer: Self-pay | Admitting: *Deleted

## 2019-05-20 NOTE — Telephone Encounter (Signed)
Left message for patient to return call regarding lung screening results noted below. Finding has been evaluated previously and does not appear to be greatly changed. Patient's pulmonologist office nurse has been notified of the results and reports they will arrange an appt with patient soon.

## 2019-05-20 NOTE — Progress Notes (Signed)
FYI. Let me know if you like to order a PET or  need a referral.  Faythe Casa, NP 05/20/2019 10:52 AM

## 2019-05-20 NOTE — Progress Notes (Signed)
Awesome. Thanks.   Faythe Casa, NP 05/20/2019 11:21 AM

## 2019-05-21 ENCOUNTER — Other Ambulatory Visit: Payer: Self-pay | Admitting: Family Medicine

## 2019-05-21 DIAGNOSIS — J432 Centrilobular emphysema: Secondary | ICD-10-CM

## 2019-05-21 DIAGNOSIS — F3341 Major depressive disorder, recurrent, in partial remission: Secondary | ICD-10-CM

## 2019-05-21 MED ORDER — SPIRIVA RESPIMAT 2.5 MCG/ACT IN AERS: 2.0000 | INHALATION_SPRAY | Freq: Every day | RESPIRATORY_TRACT | 3 refills | Status: DC

## 2019-05-21 MED ORDER — FLUTICASONE PROPIONATE 50 MCG/ACT NA SUSP: 2.0000 | Freq: Every day | NASAL | 2 refills | Status: DC

## 2019-05-21 MED ORDER — ESCITALOPRAM OXALATE 10 MG PO TABS: 10.0000 mg | ORAL_TABLET | Freq: Every day | ORAL | 1 refills | Status: DC

## 2019-05-21 NOTE — Telephone Encounter (Signed)
Medication Refill - Medication:  escitalopram (LEXAPRO) 10 MG tablet [id number] Tiotropium Bromide Monohydrate (SPIRIVA RESPIMAT) 2.5  fluticasone (FLONASE) 50 MCG/ACT nasal spray [id number]      Has the patient contacted their pharmacy? No. (Agent: If no, request that the patient contact the pharmacy for the refill.) (Agent: If yes, when and what did the pharmacy advise?)  Preferred Pharmacy (with phone number or street name):  [place], [place] [phone] (Phone)     Agent: Please be advised that RX refills may take up to 3 business days. We ask that you follow-up with your pharmacy.

## 2019-05-22 ENCOUNTER — Other Ambulatory Visit: Payer: Self-pay

## 2019-05-22 ENCOUNTER — Telehealth: Payer: Self-pay

## 2019-05-22 MED ORDER — ATORVASTATIN CALCIUM 80 MG PO TABS: 80.0000 mg | ORAL_TABLET | Freq: Every day | ORAL | 0 refills | Status: DC

## 2019-05-22 NOTE — Telephone Encounter (Signed)
Please schedule patient for follow up in the next 30 days.  

## 2019-05-22 NOTE — Telephone Encounter (Signed)
Robin Jones - This was sent to me, but I don't see any documentation as far as what is needed/being requested.  Can you please clarify?

## 2019-05-23 ENCOUNTER — Other Ambulatory Visit: Payer: Self-pay

## 2019-05-23 ENCOUNTER — Ambulatory Visit (INDEPENDENT_AMBULATORY_CARE_PROVIDER_SITE_OTHER): Payer: Medicare HMO | Admitting: Family Medicine

## 2019-05-23 ENCOUNTER — Encounter: Payer: Self-pay | Admitting: Family Medicine

## 2019-05-23 VITALS — BP 136/86 | HR 73 | Temp 97.9°F | Resp 14 | Ht 64.0 in | Wt 122.7 lb

## 2019-05-23 DIAGNOSIS — Z5181 Encounter for therapeutic drug level monitoring: Secondary | ICD-10-CM

## 2019-05-23 DIAGNOSIS — E785 Hyperlipidemia, unspecified: Secondary | ICD-10-CM

## 2019-05-23 DIAGNOSIS — J449 Chronic obstructive pulmonary disease, unspecified: Secondary | ICD-10-CM

## 2019-05-23 DIAGNOSIS — Z23 Encounter for immunization: Secondary | ICD-10-CM

## 2019-05-23 DIAGNOSIS — J432 Centrilobular emphysema: Secondary | ICD-10-CM

## 2019-05-23 DIAGNOSIS — F3341 Major depressive disorder, recurrent, in partial remission: Secondary | ICD-10-CM | POA: Diagnosis not present

## 2019-05-23 DIAGNOSIS — N182 Chronic kidney disease, stage 2 (mild): Secondary | ICD-10-CM

## 2019-05-23 DIAGNOSIS — I700 Atherosclerosis of aorta: Secondary | ICD-10-CM | POA: Diagnosis not present

## 2019-05-23 DIAGNOSIS — Z720 Tobacco use: Secondary | ICD-10-CM

## 2019-05-23 MED ORDER — FLUTICASONE PROPIONATE 50 MCG/ACT NA SUSP: 2.0000 | Freq: Every day | NASAL | 2 refills | Status: DC

## 2019-05-23 MED ORDER — ATORVASTATIN CALCIUM 80 MG PO TABS: 80.0000 mg | ORAL_TABLET | Freq: Every day | ORAL | 0 refills | Status: DC

## 2019-05-23 MED ORDER — CITALOPRAM HYDROBROMIDE 10 MG PO TABS: ORAL_TABLET | ORAL | 0 refills | Status: DC

## 2019-05-23 MED ORDER — ALBUTEROL SULFATE HFA 108 (90 BASE) MCG/ACT IN AERS: 1.0000 | INHALATION_SPRAY | Freq: Four times a day (QID) | RESPIRATORY_TRACT | 4 refills | Status: DC | PRN

## 2019-05-23 MED ORDER — ESCITALOPRAM OXALATE 5 MG PO TABS: 5.0000 mg | ORAL_TABLET | Freq: Every day | ORAL | 0 refills | Status: DC

## 2019-05-23 MED ORDER — LORATADINE 10 MG PO TABS: 10.0000 mg | ORAL_TABLET | Freq: Every day | ORAL | 5 refills | Status: DC

## 2019-05-23 MED ORDER — SPIRIVA RESPIMAT 2.5 MCG/ACT IN AERS: 2.0000 | INHALATION_SPRAY | Freq: Every day | RESPIRATORY_TRACT | 3 refills | Status: DC

## 2019-05-23 NOTE — Patient Instructions (Signed)
We will decrease your lexapro dose 5 mg and start celexa 10 mg for ONE WEEK Then stop lexapro and increase celexa to 20 mg daily for the second week and then if you feel okay, do 20 mg celexa daily and we need to recheck you in 6 weeks

## 2019-05-23 NOTE — Progress Notes (Signed)
Name: LORESE GIROUARD   MRN: [id number]    DOB: [birth year]   Date:05/23/2019       Progress Note  Chief Complaint  Patient presents with  . Follow-up  . Medication Refill  . Nicotine Dependence    wants to stop     Subjective:   Robin Jones is a 68 y.o. female, presents to clinic for routine follow up on the conditions listed above.  Wants to stop smoking, now only smoking 5 cig a day Smoked for 50 years - 1 ppd, roughly 50 pack year hx -she does see pulmonology, has emphysema, COPD, is also already doing low-dose CT lung cancer screening test.  She does have a mass that has been detected that has been unchanged she states she just recently did her follow-up CT and pulmonology stated it has been unchanged and did not order any further testing or refer her to any specialist or CT surgeons.  She has tried Chantix before it was helpful but currently is her moods are much worse than normal.  She is continuing to smoke it is particularly difficult for her because she lives with 2 brothers who smoke all day and although she has quit completely before she finds it difficult to not restart smoking when she is around it all the time. No CP, night sweats, worsening SOB (has COPD managed by pulm), no unintentional weight loss.   Needs med refills on meds for inhalers for COPD, anxiety/depression on lexapro doesn't feel like its working very well, she wants to change to something else to perhaps help with mood, feels down, sad and not motivated, esp with recent death of two young nephews     Patient Active Problem List   Diagnosis Date Noted  . Thyroid nodule 06/18/2017  . Underweight 05/08/2017  . Hyperthyroidism 05/08/2017  . Coronary artery disease 10/03/2016  . Aortic atherosclerosis ([place]) 10/03/2016  . Centrilobular emphysema ([place]) 10/03/2016  . HAV (hallux abducto valgus) 03/26/2016  . Bunion of right foot 03/10/2016  . Skin lesion of left leg 03/10/2016  . Senile purpura ([place])  03/10/2016  . Need for hepatitis C screening test 02/09/2016  . Presbycusis of both ears 02/09/2016  . Osteopenia 12/09/2015  . Insomnia 12/05/2015  . Medication monitoring encounter 11/11/2015  . Adenopathy   . Lung mass   . Lung nodule seen on imaging study 03/29/2015  . Right flank pain 03/19/2015  . GERD without esophagitis 03/03/2015  . Depression, major, recurrent, in partial remission ([place]) 03/03/2015  . CKD (chronic kidney disease) stage 2, GFR 60-89 ml/min 03/03/2015  . COPD, mild ([place]) 03/03/2015  . Tobacco abuse 03/03/2015  . Chronic constipation 03/03/2015  . Osteoarthritis of multiple joints 03/03/2015  . At risk for falls 03/03/2015  . Thyroid mass of unclear etiology 03/03/2015  . Hyperlipidemia LDL goal <70 03/03/2015  . Need for pneumococcal vaccination 03/03/2015  . Polyp of colon, adenomatous 03/03/2015  . Skin lesion of face 03/03/2015    Past Surgical History:  Procedure Laterality Date  . ABDOMINAL HYSTERECTOMY  1980   partial  . BACK SURGERY  1978  . COLONOSCOPY N/A 12/22/2014   Procedure: COLONOSCOPY;  Surgeon: Lucilla Lame, MD;  Location: [facility];  Service: Endoscopy;  Laterality: N/A;  . ELECTROMAGNETIC NAVIGATION BROCHOSCOPY N/A 04/15/2015   Procedure: ELECTROMAGNETIC NAVIGATION BRONCHOSCOPY;  Surgeon: Flora Lipps, MD;  Location: [facility];  Service: Cardiopulmonary;  Laterality: N/A;  . ENDOBRONCHIAL ULTRASOUND N/A 04/15/2015   Procedure: ENDOBRONCHIAL ULTRASOUND;  Surgeon:  Flora Lipps, MD;  Location: [facility];  Service: Cardiopulmonary;  Laterality: N/A;  . ENDOBRONCHIAL ULTRASOUND N/A 06/15/2015   Procedure: ENDOBRONCHIAL ULTRASOUND;  Surgeon: Flora Lipps, MD;  Location: [facility];  Service: Cardiopulmonary;  Laterality: N/A;  . INNER EAR SURGERY Left 2013  . TONSILLECTOMY  11    Family History  Problem Relation Age of Onset  . Cancer Sister        liver  . Thyroid disease Father   . Heart disease Mother   . Hepatitis C Sister   . Thyroid  disease Sister   . Cancer Brother 55       in muscle of leg  . Heart disease Brother   . Arthritis Brother   . COPD Sister   . Hypertension Sister   . Hyperlipidemia Sister   . Diabetes Maternal Grandmother   . Kidney disease Maternal Grandmother   . Arthritis Brother   . Breast cancer Maternal Aunt   . Breast cancer Maternal Aunt     Social History   Socioeconomic History  . Marital status: Divorced    Spouse name: Not on file  . Number of children: 1  . Years of education: Not on file  . Highest education level: 8th grade  Occupational History  . Not on file  Social Needs  . Financial resource strain: Not hard at all  . Food insecurity    Worry: Never true    Inability: Never true  . Transportation needs    Medical: No    Non-medical: No  Tobacco Use  . Smoking status: Current Every Day Smoker    Packs/day: 0.25    Years: 45.00    Pack years: 11.25    Types: Cigarettes  . Smokeless tobacco: Never Used  . Tobacco comment: only smoking 3-4 a day, trying to quit  Substance and Sexual Activity  . Alcohol use: No    Alcohol/week: 0.0 standard drinks  . Drug use: No  . Sexual activity: Not Currently  Lifestyle  . Physical activity    Days per week: 0 days    Minutes per session: 0 min  . Stress: Only a little  Relationships  . Social connections    Talks on phone: More than three times a week    Gets together: Three times a week    Attends religious service: Never    Active member of club or organization: No    Attends meetings of clubs or organizations: Never    Relationship status: Divorced  . Intimate partner violence    Fear of current or ex partner: No    Emotionally abused: No    Physically abused: No    Forced sexual activity: No  Other Topics Concern  . Not on file  Social History Narrative  . Not on file     Current Outpatient Medications:  .  acetaminophen (TYLENOL) 325 MG tablet, Take 650 mg by mouth every 6 (six) hours as needed for  moderate pain or headache., Disp: , Rfl:  .  albuterol (VENTOLIN HFA) 108 (90 Base) MCG/ACT inhaler, Inhale 1-2 puffs into the lungs every 6 (six) hours as needed for wheezing or shortness of breath., Disp: 18 g, Rfl: 4 .  aspirin EC 81 MG tablet, Take 1 tablet (81 mg total) by mouth daily., Disp: , Rfl:  .  atorvastatin (LIPITOR) 80 MG tablet, Take 1 tablet (80 mg total) by mouth at bedtime., Disp: 30 tablet, Rfl: 0 .  cholecalciferol (VITAMIN D) 1000  units tablet, Take 1,000 Units by mouth daily., Disp: , Rfl:  .  escitalopram (LEXAPRO) 10 MG tablet, Take 1 tablet (10 mg total) by mouth daily., Disp: 90 tablet, Rfl: 1 .  fluticasone (FLONASE) 50 MCG/ACT nasal spray, Place 2 sprays into both nostrils daily., Disp: 16 g, Rfl: 2 .  loratadine (CLARITIN) 10 MG tablet, Take 1 tablet (10 mg total) by mouth daily., Disp: 30 tablet, Rfl: 5 .  Tiotropium Bromide Monohydrate (SPIRIVA RESPIMAT) 2.5 MCG/ACT AERS, Inhale 2 puffs into the lungs daily. Inhale 2 puffs daily into the lungs., Disp: 4 g, Rfl: 3 .  citalopram (CELEXA) 10 MG tablet, WEEK 1, take 10 mg PO once daily, WEEK 2, take 2 tabs (20 mg) PO daily, Disp: 40 tablet, Rfl: 0 .  escitalopram (LEXAPRO) 5 MG tablet, Take 1 tablet (5 mg total) by mouth daily., Disp: 7 tablet, Rfl: 0 .  ondansetron (ZOFRAN ODT) 4 MG disintegrating tablet, Take 1 tablet (4 mg total) by mouth every 8 (eight) hours as needed for nausea or vomiting. (Patient not taking: Reported on 05/23/2019), Disp: 20 tablet, Rfl: 0 .  varenicline (CHANTIX CONTINUING MONTH PAK) 1 MG tablet, Take 1 tablet (1 mg total) by mouth 2 (two) times daily. (Patient not taking: Reported on 09/10/2018), Disp: 60 tablet, Rfl: 2  Current Facility-Administered Medications:  .  albuterol (PROVENTIL) (2.5 MG/3ML) 0.083% nebulizer solution 2.5 mg, 2.5 mg, Nebulization, Once, [place], Bethel Born, NP  Allergies  Allergen Reactions  . Trelegy Ellipta [Fluticasone-Umeclidin-Vilant] Other (See Comments)    Pt  states that this made her mouth and throat very sore, unable to tolerate     I personally reviewed active problem list, medication list, allergies, family history, social history, health maintenance, notes from last encounter, lab results, imaging with the patient/caregiver today.  Review of Systems  Constitutional: Negative.   HENT: Negative.   Eyes: Negative.   Respiratory: Negative.   Cardiovascular: Negative.   Gastrointestinal: Negative.   Endocrine: Negative.   Genitourinary: Negative.   Musculoskeletal: Negative.   Skin: Negative.   Allergic/Immunologic: Negative.   Neurological: Negative.   Hematological: Negative.   Psychiatric/Behavioral: Positive for dysphoric mood. Negative for self-injury, sleep disturbance and suicidal ideas. The patient is not nervous/anxious.   All other systems reviewed and are negative.    Objective:    Vitals:   05/23/19 1150  BP: 136/86  Pulse: 73  Resp: 14  Temp: 97.9 F (36.6 C)  SpO2: 97%  Weight: 122 lb 11.2 oz (55.7 kg)  Height: 5\' 4"  (1.626 m)    Body mass index is 21.06 kg/m.  Physical Exam   Recent Results (from the past 2160 hour(s))  CBC with Differential/Platelet     Status: None   Collection Time: 05/23/19 12:00 AM  Result Value Ref Range   WBC 7.5 3.8 - 10.8 Thousand/uL   RBC 4.00 3.80 - 5.10 Million/uL   Hemoglobin 11.8 11.7 - 15.5 g/dL   HCT 36.1 35.0 - 45.0 %   MCV 90.3 80.0 - 100.0 fL   MCH 29.5 27.0 - 33.0 pg   MCHC 32.7 32.0 - 36.0 g/dL   RDW 12.4 11.0 - 15.0 %   Platelets 209 140 - 400 Thousand/uL   MPV 11.1 7.5 - 12.5 fL   Neutro Abs 4,530 1,500 - 7,800 cells/uL   Lymphs Abs 2,355 850 - 3,900 cells/uL   Absolute Monocytes 488 200 - 950 cells/uL   Eosinophils Absolute 90 15 - 500 cells/uL   Basophils Absolute  38 0 - 200 cells/uL   Neutrophils Relative % 60.4 %   Total Lymphocyte 31.4 %   Monocytes Relative 6.5 %   Eosinophils Relative 1.2 %   Basophils Relative 0.5 %    Diabetic Foot Exam:  Diabetic Foot Exam - Simple   No data filed       PHQ2/9: Depression screen [facility] 2/9 05/23/2019 09/10/2018 07/25/2018 06/25/2018 11/29/2017  Decreased Interest 0 0 0 0 0  Down, Depressed, Hopeless 0 0 0 0 0  PHQ - 2 Score 0 0 0 0 0  Altered sleeping 0 1 0 0 -  Tired, decreased energy 0 1 0 0 -  Change in appetite 0 3 0 0 -  Feeling bad or failure about yourself  0 0 0 0 -  Trouble concentrating 0 0 0 0 -  Moving slowly or fidgety/restless 0 0 0 0 -  Suicidal thoughts 0 0 0 0 -  PHQ-9 Score 0 5 0 0 -  Difficult doing work/chores Not difficult at all Not difficult at all Not difficult at all Not difficult at all -    phq 9 is negative But discussed above in HPI - pt having worse sx  Fall Risk: Fall Risk  05/23/2019 09/10/2018 07/25/2018 06/25/2018 11/29/2017  Falls in the past year? 0 0 0 0 No  Number falls in past yr: 0 0 0 0 -  Injury with Fall? 0 0 0 0 -  Follow up - Falls prevention discussed - - -     Functional Status Survey: Is the patient deaf or have difficulty hearing?: Yes Does the patient have difficulty seeing, even when wearing glasses/contacts?: No Does the patient have difficulty concentrating, remembering, or making decisions?: No Does the patient have difficulty walking or climbing stairs?: No Does the patient have difficulty dressing or bathing?: No Does the patient have difficulty doing errands alone such as visiting a doctor's office or shopping?: No    Assessment & Plan:    1. Tobacco abuse Smoking cessation instruction/counseling given:  counseled patient on the dangers of tobacco use, advised patient to stop smoking, and reviewed strategies to maximize success - will refer her to [facility] resources, and when mood is better managed will try chantix again.  2. Depression, major, recurrent, in partial remission ([place]) Worse -will do a cross taper with decreasing Lexapro and try Celexa Will decrease Lexapro to 5 mg daily while starting Celexa 10 mg daily for 1  week On week 2 will discontinue Lexapro and increase Celexa to 20 mg daily and will do a close follow-up in 4 weeks  3. Hyperlipidemia LDL goal <70 Patient compliant with her meds without any concerns will check her labs today - COMPLETE METABOLIC PANEL WITH GFR - Lipid panel  4. Centrilobular emphysema ([place]) Patient sees Dr. Raul Del -pulmonology but she does request a refill on the inhalers here today - Tiotropium Bromide Monohydrate (SPIRIVA RESPIMAT) 2.5 MCG/ACT AERS; Inhale 2 puffs into the lungs daily. Inhale 2 puffs daily into the lungs.  Dispense: 4 g; Refill: 3 - albuterol (VENTOLIN HFA) 108 (90 Base) MCG/ACT inhaler; Inhale 1-2 puffs into the lungs every 6 (six) hours as needed for wheezing or shortness of breath.  Dispense: 18 g; Refill: 4  5. Aortic atherosclerosis ([facility]) - COMPLETE METABOLIC PANEL WITH GFR - Lipid panel  6. CKD (chronic kidney disease) stage 2, GFR 60-89 ml/min Recheck labs - COMPLETE METABOLIC PANEL WITH GFR  7. COPD, mild ([facility]) See above (emphysema) - albuterol (  VENTOLIN HFA) 108 (90 Base) MCG/ACT inhaler; Inhale 1-2 puffs into the lungs every 6 (six) hours as needed for wheezing or shortness of breath.  Dispense: 18 g; Refill: 4  8. Medication monitoring encounter - CBC with Differential/Platelet - COMPLETE METABOLIC PANEL WITH GFR - Lipid panel  9. Need for influenza vaccination Done today - Flu Vaccine QUAD High Dose(Fluad)   Return in about 1 month (around 06/23/2019) for med change f/up on depression lexapro/celexa change.   Delsa Grana, PA-C 05/23/19 4:56 PM

## 2019-05-23 NOTE — Telephone Encounter (Signed)
vm left to sch appt

## 2019-05-24 LAB — CBC WITH DIFFERENTIAL/PLATELET
Absolute Monocytes: 488 cells/uL (ref 200–950)
Basophils Absolute: 38 cells/uL (ref 0–200)
Basophils Relative: 0.5 %
Eosinophils Absolute: 90 cells/uL (ref 15–500)
Eosinophils Relative: 1.2 %
HCT: 36.1 % (ref 35.0–45.0)
Hemoglobin: 11.8 g/dL (ref 11.7–15.5)
Lymphs Abs: 2355 cells/uL (ref 850–3900)
MCH: 29.5 pg (ref 27.0–33.0)
MCHC: 32.7 g/dL (ref 32.0–36.0)
MCV: 90.3 fL (ref 80.0–100.0)
MPV: 11.1 fL (ref 7.5–12.5)
Monocytes Relative: 6.5 %
Neutro Abs: 4530 cells/uL (ref 1500–7800)
Neutrophils Relative %: 60.4 %
Platelets: 209 10*3/uL (ref 140–400)
RBC: 4 10*6/uL (ref 3.80–5.10)
RDW: 12.4 % (ref 11.0–15.0)
Total Lymphocyte: 31.4 %
WBC: 7.5 10*3/uL (ref 3.8–10.8)

## 2019-05-24 LAB — COMPLETE METABOLIC PANEL WITH GFR
AG Ratio: 1.9 (calc) (ref 1.0–2.5)
ALT: 4 U/L — ABNORMAL LOW (ref 6–29)
AST: 11 U/L (ref 10–35)
Albumin: 4.2 g/dL (ref 3.6–5.1)
Alkaline phosphatase (APISO): 94 U/L (ref 37–153)
BUN/Creatinine Ratio: 12 (calc) (ref 6–22)
BUN: 11 mg/dL (ref 7–25)
CO2: 30 mmol/L (ref 20–32)
Calcium: 9.7 mg/dL (ref 8.6–10.4)
Chloride: 105 mmol/L (ref 98–110)
Creat: 0.95 mg/dL — ABNORMAL HIGH (ref 0.60–0.93)
GFR, Est African American: 70 mL/min/{1.73_m2} (ref >=60)
GFR, Est Non African American: 61 mL/min/{1.73_m2} (ref >=60)
Globulin: 2.2 g/dL (calc) (ref 1.9–3.7)
Glucose, Bld: 80 mg/dL (ref 65–99)
Potassium: 3.9 mmol/L (ref 3.5–5.3)
Sodium: 140 mmol/L (ref 135–146)
Total Bilirubin: 0.5 mg/dL (ref 0.2–1.2)
Total Protein: 6.4 g/dL (ref 6.1–8.1)

## 2019-05-24 LAB — LIPID PANEL
Cholesterol: 178 mg/dL (ref <200)
HDL: 54 mg/dL (ref >=50)
LDL Cholesterol (Calc): 108 mg/dL (calc) — ABNORMAL HIGH
Non-HDL Cholesterol (Calc): 124 mg/dL (calc) (ref <130)
Total CHOL/HDL Ratio: 3.3 (calc) (ref <5.0)
Triglycerides: 74 mg/dL (ref <150)

## 2019-05-29 ENCOUNTER — Telehealth: Payer: Self-pay | Admitting: *Deleted

## 2019-05-29 NOTE — Telephone Encounter (Signed)
Received referral for smoking cessation assistance for patient. Attempted to contact to discuss [employer] smoking cessation program offered by [facility]. Voicemail left for patient to return call.

## 2019-06-09 ENCOUNTER — Telehealth: Payer: Self-pay | Admitting: *Deleted

## 2019-06-09 NOTE — Telephone Encounter (Signed)
Attempted to contact patient and left voicemail for third time related to smoking cessation counseling.

## 2019-06-24 ENCOUNTER — Other Ambulatory Visit: Payer: Self-pay

## 2019-06-24 ENCOUNTER — Ambulatory Visit (INDEPENDENT_AMBULATORY_CARE_PROVIDER_SITE_OTHER): Payer: Medicare HMO | Admitting: Family Medicine

## 2019-06-24 ENCOUNTER — Encounter: Payer: Self-pay | Admitting: Family Medicine

## 2019-06-24 VITALS — BP 128/72 | HR 87 | Temp 97.3°F | Resp 14 | Ht 64.0 in | Wt 117.2 lb

## 2019-06-24 DIAGNOSIS — F331 Major depressive disorder, recurrent, moderate: Secondary | ICD-10-CM

## 2019-06-24 DIAGNOSIS — E785 Hyperlipidemia, unspecified: Secondary | ICD-10-CM | POA: Diagnosis not present

## 2019-06-24 DIAGNOSIS — R231 Pallor: Secondary | ICD-10-CM

## 2019-06-24 DIAGNOSIS — B349 Viral infection, unspecified: Secondary | ICD-10-CM | POA: Diagnosis not present

## 2019-06-24 DIAGNOSIS — K529 Noninfective gastroenteritis and colitis, unspecified: Secondary | ICD-10-CM | POA: Diagnosis not present

## 2019-06-24 DIAGNOSIS — R1011 Right upper quadrant pain: Secondary | ICD-10-CM | POA: Diagnosis not present

## 2019-06-24 DIAGNOSIS — R5383 Other fatigue: Secondary | ICD-10-CM

## 2019-06-24 DIAGNOSIS — Z720 Tobacco use: Secondary | ICD-10-CM | POA: Diagnosis not present

## 2019-06-24 MED ORDER — CITALOPRAM HYDROBROMIDE 20 MG PO TABS: 20.0000 mg | ORAL_TABLET | Freq: Every day | ORAL | 1 refills | Status: DC

## 2019-06-24 MED ORDER — ATORVASTATIN CALCIUM 80 MG PO TABS: 80.0000 mg | ORAL_TABLET | Freq: Every day | ORAL | 1 refills | Status: DC

## 2019-06-24 MED ORDER — CITALOPRAM HYDROBROMIDE 20 MG PO TABS: 20.0000 mg | ORAL_TABLET | Freq: Every day | ORAL | 0 refills | Status: DC

## 2019-06-24 NOTE — Progress Notes (Signed)
Name: Robin Jones   MRN: [id number]    DOB: 1951-06-17   Date:06/24/2019       Progress Note  Chief Complaint  Patient presents with  . Follow-up  . Depression     Subjective:   Robin Jones is a 68 y.o. female, presents to clinic for routine follow up on the conditions listed above.  Pt is here for follow up on depression after starting medications - citalopram 10 mg and increased to 20 mg daily.  She was on Lexapro 10 mg but she had run out.  We discussed her resuming Lexapro and tapering off because at her last appointment she felt so bad.  I did prescribe Lexapro 10 for a week and decreasing to 5 mg for the next week but she never got these medications.  She did start citalopram 10 mg and increase to 20 mg daily, she has no side effects or concerns.  She states that she feels much better her mood is good and she feels it is much more effective than Lexapro was.  Her PHQ screening today is unfortunately positive and much higher than her last 2 screenings.  Most of the positive answers are relative to physical symptoms and she is recently ill and still very symptomatic and fatigued. Depression screen [facility] 2/9 06/24/2019 05/23/2019 09/10/2018  Decreased Interest 2 0 0  Down, Depressed, Hopeless 2 0 0  PHQ - 2 Score 4 0 0  Altered sleeping 3 0 1  Tired, decreased energy 2 0 1  Change in appetite 3 0 3  Feeling bad or failure about yourself  1 0 0  Trouble concentrating 1 0 0  Moving slowly or fidgety/restless 2 0 0  Suicidal thoughts 0 0 0  PHQ-9 Score 16 0 5  Difficult doing work/chores Somewhat difficult Not difficult at all Not difficult at all   About 2 weeks ago she had a GI illness with N/V severely for 2 days, very difficult to keep anything down including sips of liquids.  On the third day 3rd day, her symptoms improved slightly and she was able to tolerate sprite with crackers.  About a week ago she had one day of loose stool, but otherwise for a week her bowels have been  normal and she has had no vomiting and no nausea..  She still doesn't feel like her appetite and energy are back to normal.  She is trying to do a meal replacement with protein and extra calories, she eats dinner with her brother but doesn't feel hungry and does not have a lot of energy.  She did lose some weight proximately 5 pounds.  She did endorse some abdominal pain to her right upper quadrant that was more severe at the onset of her pain and is now improved.  She denies any hematemesis, melena, hematochezia, abdominal bloating/distention, chest pain, back pain, syncope.  She did have associated sweats and chills at the time of acute illness 2 weeks ago but no fever.  She does inquire about Covid testing although she states that she has not been around anybody but her family member she lives with and her one family member in a nearby house who she helps with her online school.  No known sick contacts.  She denies any respiratory symptoms, headache, rash or any other associated symptoms. Weight loss: Wt Readings from Last 5 Encounters:  06/24/19 117 lb 3.2 oz (53.2 kg)  05/23/19 122 lb 11.2 oz (55.7 kg)  05/15/19 115  lb (52.2 kg)  09/10/18 115 lb 9.6 oz (52.4 kg)  08/12/18 116 lb (52.6 kg)   BMI Readings from Last 5 Encounters:  06/24/19 20.12 kg/m  05/23/19 21.06 kg/m  05/15/19 19.74 kg/m  09/10/18 19.84 kg/m  08/12/18 19.91 kg/m   Still smoking 1/4 ppd, she is interested in talking to smoking cessation program but they did not connect on the phone, she if often helping her 21 y/o niece with virtual school at another house and only has land line at her house with answering machine.  She is still interested in smoking cessation we discussed it further today.     Patient Active Problem List   Diagnosis Date Noted  . Thyroid nodule 06/18/2017  . Underweight 05/08/2017  . Hyperthyroidism 05/08/2017  . Coronary artery disease 10/03/2016  . Aortic atherosclerosis ([place]) 10/03/2016  .  Centrilobular emphysema ([place]) 10/03/2016  . HAV (hallux abducto valgus) 03/26/2016  . Bunion of right foot 03/10/2016  . Skin lesion of left leg 03/10/2016  . Senile purpura ([place]) 03/10/2016  . Need for hepatitis C screening test 02/09/2016  . Presbycusis of both ears 02/09/2016  . Osteopenia 12/09/2015  . Insomnia 12/05/2015  . Medication monitoring encounter 11/11/2015  . Adenopathy   . Lung mass   . Lung nodule seen on imaging study 03/29/2015  . Right flank pain 03/19/2015  . GERD without esophagitis 03/03/2015  . Depression, major, recurrent, in partial remission ([place]) 03/03/2015  . CKD (chronic kidney disease) stage 2, GFR 60-89 ml/min 03/03/2015  . COPD, mild ([place]) 03/03/2015  . Tobacco abuse 03/03/2015  . Chronic constipation 03/03/2015  . Osteoarthritis of multiple joints 03/03/2015  . At risk for falls 03/03/2015  . Thyroid mass of unclear etiology 03/03/2015  . Hyperlipidemia LDL goal <70 03/03/2015  . Need for pneumococcal vaccination 03/03/2015  . Polyp of colon, adenomatous 03/03/2015  . Skin lesion of face 03/03/2015    Past Surgical History:  Procedure Laterality Date  . ABDOMINAL HYSTERECTOMY  1980   partial  . BACK SURGERY  1978  . COLONOSCOPY N/A 12/22/2014   Procedure: COLONOSCOPY;  Surgeon: Lucilla Lame, MD;  Location: [facility] ENDOSCOPY;  Service: Endoscopy;  Laterality: N/A;  . ELECTROMAGNETIC NAVIGATION BROCHOSCOPY N/A 04/15/2015   Procedure: ELECTROMAGNETIC NAVIGATION BRONCHOSCOPY;  Surgeon: Flora Lipps, MD;  Location: [facility] ORS;  Service: Cardiopulmonary;  Laterality: N/A;  . ENDOBRONCHIAL ULTRASOUND N/A 04/15/2015   Procedure: ENDOBRONCHIAL ULTRASOUND;  Surgeon: Flora Lipps, MD;  Location: [facility] ORS;  Service: Cardiopulmonary;  Laterality: N/A;  . ENDOBRONCHIAL ULTRASOUND N/A 06/15/2015   Procedure: ENDOBRONCHIAL ULTRASOUND;  Surgeon: Flora Lipps, MD;  Location: [facility] ORS;  Service: Cardiopulmonary;  Laterality: N/A;  . INNER EAR SURGERY Left 2013  .  TONSILLECTOMY  11    Family History  Problem Relation Age of Onset  . Cancer Sister        liver  . Thyroid disease Father   . Heart disease Mother   . Hepatitis C Sister   . Thyroid disease Sister   . Cancer Brother 55       in muscle of leg  . Heart disease Brother   . Arthritis Brother   . COPD Sister   . Hypertension Sister   . Hyperlipidemia Sister   . Diabetes Maternal Grandmother   . Kidney disease Maternal Grandmother   . Arthritis Brother   . Breast cancer Maternal Aunt   . Breast cancer Maternal Aunt     Social History   Socioeconomic History  .  Marital status: Divorced    Spouse name: Not on file  . Number of children: 1  . Years of education: Not on file  . Highest education level: 8th grade  Occupational History  . Not on file  Social Needs  . Financial resource strain: Not hard at all  . Food insecurity    Worry: Never true    Inability: Never true  . Transportation needs    Medical: No    Non-medical: No  Tobacco Use  . Smoking status: Current Every Day Smoker    Packs/day: 0.25    Years: 45.00    Pack years: 11.25    Types: Cigarettes  . Smokeless tobacco: Never Used  . Tobacco comment: only smoking 3-4 a day, trying to quit  Substance and Sexual Activity  . Alcohol use: No    Alcohol/week: 0.0 standard drinks  . Drug use: No  . Sexual activity: Not Currently  Lifestyle  . Physical activity    Days per week: 0 days    Minutes per session: 0 min  . Stress: Only a little  Relationships  . Social connections    Talks on phone: More than three times a week    Gets together: Three times a week    Attends religious service: Never    Active member of club or organization: No    Attends meetings of clubs or organizations: Never    Relationship status: Divorced  . Intimate partner violence    Fear of current or ex partner: No    Emotionally abused: No    Physically abused: No    Forced sexual activity: No  Other Topics Concern  . Not  on file  Social History Narrative  . Not on file     Current Outpatient Medications:  .  acetaminophen (TYLENOL) 325 MG tablet, Take 650 mg by mouth every 6 (six) hours as needed for moderate pain or headache., Disp: , Rfl:  .  albuterol (VENTOLIN HFA) 108 (90 Base) MCG/ACT inhaler, Inhale 1-2 puffs into the lungs every 6 (six) hours as needed for wheezing or shortness of breath., Disp: 18 g, Rfl: 4 .  aspirin EC 81 MG tablet, Take 1 tablet (81 mg total) by mouth daily., Disp: , Rfl:  .  atorvastatin (LIPITOR) 80 MG tablet, Take 1 tablet (80 mg total) by mouth at bedtime., Disp: 30 tablet, Rfl: 0 .  cholecalciferol (VITAMIN D) 1000 units tablet, Take 1,000 Units by mouth daily., Disp: , Rfl:  .  citalopram (CELEXA) 10 MG tablet, WEEK 1, take 10 mg PO once daily, WEEK 2, take 2 tabs (20 mg) PO daily, Disp: 40 tablet, Rfl: 0 .  fluticasone (FLONASE) 50 MCG/ACT nasal spray, Place 2 sprays into both nostrils daily., Disp: 16 g, Rfl: 2 .  loratadine (CLARITIN) 10 MG tablet, Take 1 tablet (10 mg total) by mouth daily., Disp: 30 tablet, Rfl: 5 .  Tiotropium Bromide Monohydrate (SPIRIVA RESPIMAT) 2.5 MCG/ACT AERS, Inhale 2 puffs into the lungs daily. Inhale 2 puffs daily into the lungs., Disp: 4 g, Rfl: 3 .  escitalopram (LEXAPRO) 10 MG tablet, Take 1 tablet (10 mg total) by mouth daily. (Patient not taking: Reported on 06/24/2019), Disp: 90 tablet, Rfl: 1 .  escitalopram (LEXAPRO) 5 MG tablet, Take 1 tablet (5 mg total) by mouth daily. (Patient not taking: Reported on 06/24/2019), Disp: 7 tablet, Rfl: 0 .  ondansetron (ZOFRAN ODT) 4 MG disintegrating tablet, Take 1 tablet (4 mg total) by mouth every  8 (eight) hours as needed for nausea or vomiting. (Patient not taking: Reported on 05/23/2019), Disp: 20 tablet, Rfl: 0 .  varenicline (CHANTIX CONTINUING MONTH PAK) 1 MG tablet, Take 1 tablet (1 mg total) by mouth 2 (two) times daily. (Patient not taking: Reported on 09/10/2018), Disp: 60 tablet, Rfl: 2   Current Facility-Administered Medications:  .  albuterol (PROVENTIL) (2.5 MG/3ML) 0.083% nebulizer solution 2.5 mg, 2.5 mg, Nebulization, Once, [place], Bethel Born, NP  Allergies  Allergen Reactions  . Trelegy Ellipta [Fluticasone-Umeclidin-Vilant] Other (See Comments)    Pt states that this made her mouth and throat very sore, unable to tolerate     I personally reviewed active problem list, medication list, allergies, family history, social history, health maintenance, notes from last encounter, lab results, imaging with the patient/caregiver today.  Review of Systems  Constitutional: Positive for appetite change and fatigue. Negative for activity change and fever.  HENT: Negative.   Eyes: Negative.   Respiratory: Negative.  Negative for cough, chest tightness, shortness of breath and wheezing.   Cardiovascular: Negative.  Negative for chest pain, palpitations and leg swelling.  Gastrointestinal: Negative.   Endocrine: Negative.   Genitourinary: Negative.   Musculoskeletal: Negative.   Skin: Negative.   Allergic/Immunologic: Negative.   Neurological: Negative.   Hematological: Negative.   Psychiatric/Behavioral: Negative.  Negative for decreased concentration, dysphoric mood, sleep disturbance and suicidal ideas. The patient is not nervous/anxious.   All other systems reviewed and are negative.    Objective:    Vitals:   06/24/19 1116  BP: 128/72  Pulse: 87  Resp: 14  Temp: (!) 97.3 F (36.3 C)  SpO2: 98%  Weight: 117 lb 3.2 oz (53.2 kg)  Height: 5\' 4"  (1.626 m)    Body mass index is 20.12 kg/m.  Physical Exam Vitals signs and nursing note reviewed.  Constitutional:      General: She is not in acute distress.    Appearance: She is well-developed. She is not toxic-appearing or diaphoretic.     Interventions: Face mask in place.     Comments: Thin, chronically ill appearing female, slightly pale, alert, pleasant, NAD  HENT:     Head: Normocephalic and  atraumatic.     Right Ear: External ear normal.     Left Ear: External ear normal.     Mouth/Throat:     Mouth: Mucous membranes are dry.     Pharynx: Oropharynx is clear.     Comments: Slightly pale oral mucosa and dry matted tongue Eyes:     General: Lids are normal. No scleral icterus.       Right eye: No discharge.        Left eye: No discharge.     Conjunctiva/sclera: Conjunctivae normal.  Neck:     Musculoskeletal: Normal range of motion and neck supple.     Trachea: Phonation normal. No tracheal deviation.  Cardiovascular:     Rate and Rhythm: Normal rate and regular rhythm.     Pulses: Normal pulses.          Radial pulses are 2+ on the right side and 2+ on the left side.       Posterior tibial pulses are 2+ on the right side and 2+ on the left side.     Heart sounds: Normal heart sounds. No murmur. No friction rub. No gallop.   Pulmonary:     Effort: Pulmonary effort is normal. No respiratory distress.     Breath sounds: Normal breath sounds. No  stridor. No wheezing, rhonchi or rales.  Chest:     Chest wall: No tenderness.  Abdominal:     General: Abdomen is flat. Bowel sounds are normal. There is no distension.     Palpations: Abdomen is soft. There is no hepatomegaly, splenomegaly, mass or pulsatile mass.     Tenderness: There is abdominal tenderness in the right upper quadrant. There is no right CVA tenderness, left CVA tenderness, guarding or rebound.     Comments: Abdomen soft, normal bowel sounds, mildly tender to right upper quadrant without guarding or rebound  Musculoskeletal: Normal range of motion.        General: No deformity.     Right lower leg: No edema.     Left lower leg: No edema.  Lymphadenopathy:     Cervical: No cervical adenopathy.  Skin:    General: Skin is warm and dry.     Capillary Refill: Capillary refill takes less than 2 seconds.     Coloration: Skin is not jaundiced or pale.     Findings: No rash.  Neurological:     Mental Status: She  is alert and oriented to person, place, and time.     Motor: No abnormal muscle tone.     Gait: Gait normal.  Psychiatric:        Attention and Perception: Attention normal.        Mood and Affect: Mood and affect normal. Mood is not anxious or depressed. Affect is not flat or tearful.        Speech: Speech normal.        Behavior: Behavior normal. Behavior is cooperative.        Thought Content: Thought content normal. Thought content does not include homicidal or suicidal ideation. Thought content does not include homicidal or suicidal plan.      Recent Results (from the past 2160 hour(s))  CBC with Differential/Platelet     Status: None   Collection Time: 05/23/19 12:00 AM  Result Value Ref Range   WBC 7.5 3.8 - 10.8 Thousand/uL   RBC 4.00 3.80 - 5.10 Million/uL   Hemoglobin 11.8 11.7 - 15.5 g/dL   HCT 36.1 35.0 - 45.0 %   MCV 90.3 80.0 - 100.0 fL   MCH 29.5 27.0 - 33.0 pg   MCHC 32.7 32.0 - 36.0 g/dL   RDW 12.4 11.0 - 15.0 %   Platelets 209 140 - 400 Thousand/uL   MPV 11.1 7.5 - 12.5 fL   Neutro Abs 4,530 1,500 - 7,800 cells/uL   Lymphs Abs 2,355 850 - 3,900 cells/uL   Absolute Monocytes 488 200 - 950 cells/uL   Eosinophils Absolute 90 15 - 500 cells/uL   Basophils Absolute 38 0 - 200 cells/uL   Neutrophils Relative % 60.4 %   Total Lymphocyte 31.4 %   Monocytes Relative 6.5 %   Eosinophils Relative 1.2 %   Basophils Relative 0.5 %  COMPLETE METABOLIC PANEL WITH GFR     Status: Abnormal   Collection Time: 05/23/19 12:00 AM  Result Value Ref Range   Glucose, Bld 80 65 - 99 mg/dL    Comment: .            Fasting reference interval .    BUN 11 7 - 25 mg/dL   Creat 0.95 (H) 0.60 - 0.93 mg/dL    Comment: For patients >71 years of age, the reference limit for Creatinine is approximately 13% higher for people identified as African-American. Marland Kitchen  GFR, Est Non African American 61 > OR = 60 mL/min/1.60m2   GFR, Est African American 70 > OR = 60 mL/min/1.42m2    BUN/Creatinine Ratio 12 6 - 22 (calc)   Sodium 140 135 - 146 mmol/L   Potassium 3.9 3.5 - 5.3 mmol/L   Chloride 105 98 - 110 mmol/L   CO2 30 20 - 32 mmol/L   Calcium 9.7 8.6 - 10.4 mg/dL   Total Protein 6.4 6.1 - 8.1 g/dL   Albumin 4.2 3.6 - 5.1 g/dL   Globulin 2.2 1.9 - 3.7 g/dL (calc)   AG Ratio 1.9 1.0 - 2.5 (calc)   Total Bilirubin 0.5 0.2 - 1.2 mg/dL   Alkaline phosphatase (APISO) 94 37 - 153 U/L   AST 11 10 - 35 U/L   ALT 4 (L) 6 - 29 U/L  Lipid panel     Status: Abnormal   Collection Time: 05/23/19 12:00 AM  Result Value Ref Range   Cholesterol 178 <200 mg/dL   HDL 54 > OR = 50 mg/dL   Triglycerides 74 <150 mg/dL   LDL Cholesterol (Calc) 108 (H) mg/dL (calc)    Comment: Reference range: <100 . Desirable range <100 mg/dL for primary prevention;   <70 mg/dL for patients with CHD or diabetic patients  with > or = 2 CHD risk factors. Marland Kitchen LDL-C is now calculated using the Martin-Hopkins  calculation, which is a validated novel method providing  better accuracy than the Friedewald equation in the  estimation of LDL-C.  Cresenciano Genre et al. Annamaria Helling. [id number]): 2061-2068  (http://education.[facility].com/faq/FAQ164)    Total CHOL/HDL Ratio 3.3 <5.0 (calc)   Non-HDL Cholesterol (Calc) 124 <130 mg/dL (calc)    Comment: For patients with diabetes plus 1 major ASCVD risk  factor, treating to a non-HDL-C goal of <100 mg/dL  (LDL-C of <70 mg/dL) is considered a therapeutic  option.      PHQ2/9: Depression screen [facility] 2/9 06/24/2019 05/23/2019 09/10/2018 07/25/2018 06/25/2018  Decreased Interest 2 0 0 0 0  Down, Depressed, Hopeless 2 0 0 0 0  PHQ - 2 Score 4 0 0 0 0  Altered sleeping 3 0 1 0 0  Tired, decreased energy 2 0 1 0 0  Change in appetite 3 0 3 0 0  Feeling bad or failure about yourself  1 0 0 0 0  Trouble concentrating 1 0 0 0 0  Moving slowly or fidgety/restless 2 0 0 0 0  Suicidal thoughts 0 0 0 0 0  PHQ-9 Score 16 0 5 0 0  Difficult doing work/chores  Somewhat difficult Not difficult at all Not difficult at all Not difficult at all Not difficult at all    phq 9 is positive Addressed in HPI and A&P - on medications  Fall Risk: Fall Risk  06/24/2019 05/23/2019 09/10/2018 07/25/2018 06/25/2018  Falls in the past year? 1 0 0 0 0  Number falls in past yr: 0 0 0 0 0  Injury with Fall? 0 0 0 0 0  Follow up - - Falls prevention discussed - -    Functional Status Survey: Is the patient deaf or have difficulty hearing?: Yes Does the patient have difficulty seeing, even when wearing glasses/contacts?: No Does the patient have difficulty concentrating, remembering, or making decisions?: No Does the patient have difficulty walking or climbing stairs?: No Does the patient have difficulty dressing or bathing?: No Does the patient have difficulty doing errands alone such as visiting a doctor's office or shopping?: No  Assessment & Plan:     ICD-10-CM   1. Moderate episode of recurrent major depressive disorder ([place])   Long discussion about her mood and anxiety, she feels much better and she would like to continue the citalopram 20 mg tablet she is not having any side effects or concerns.  We discussed that if she does have any worsening depression we can increase the dose.  She wanted a 1 month supply sent locally because she will be out tomorrow and after that wanted it sent to the mail order pharmacy. 75-month supply was prescribed today and she was encouraged to initiate follow-up if she felt like she needed a dose adjustment.  Today no SI, HI, AVH, although she appears tired and a little pale, her mood is very good and I feel the medication change has been very successful for the patient.  F33.1 citalopram (CELEXA) 20 MG tablet  2. Fatigue, unspecified type  R53.83 CBC w/ Diff    CMP w GFR   Secondary to recent illness with some weight loss encourage nutrition, hydration -boost and Ensure  3. Pallor  R23.1 CBC w/ Diff   after recent illness,  slightly worse than her baseline, rechecking CBC, kidney function, liver function and urine  4. Gastroenteritis  K52.9 CBC w/ Diff    CMP w GFR    UA w/reflex microscopy, LAB    Lipase    COVID   2 weeks ago, lasted 4-7 days, sweats chills N, V, D, now decreased appetite, pallor fatigue  5. Colicky RUQ abdominal pain  R10.11 CBC w/ Diff    CMP w GFR    UA w/reflex microscopy, LAB    Lipase   tender on exam - pt doesn't know if she has had any abd surgeries, will review chart and order RUQ [place] if needed, labs for hepatic function and lipase  6. Viral illness  B34.9 COVID  7. Hyperlipidemia LDL goal <70  E78.5 atorvastatin (LIPITOR) 80 MG tablet   Addressed at last visit just needed 90-day refill today  8. Tobacco abuse  Z72.0    referred back to [facility] - pt needs voicemail with phone number, often helping family members during the day with virtual school, will call back      Return if symptoms worsen or fail to improve.   Delsa Grana, PA-C 06/24/19 11:33 AM

## 2019-06-25 LAB — CBC WITH DIFFERENTIAL/PLATELET
Absolute Monocytes: 442 cells/uL (ref 200–950)
Basophils Absolute: 40 cells/uL (ref 0–200)
Basophils Relative: 0.5 %
Eosinophils Absolute: 40 cells/uL (ref 15–500)
Eosinophils Relative: 0.5 %
HCT: 38.1 % (ref 35.0–45.0)
Hemoglobin: 12.9 g/dL (ref 11.7–15.5)
Lymphs Abs: 2038 cells/uL (ref 850–3900)
MCH: 30.4 pg (ref 27.0–33.0)
MCHC: 33.9 g/dL (ref 32.0–36.0)
MCV: 89.9 fL (ref 80.0–100.0)
MPV: 10.6 fL (ref 7.5–12.5)
Monocytes Relative: 5.6 %
Neutro Abs: 5340 cells/uL (ref 1500–7800)
Neutrophils Relative %: 67.6 %
Platelets: 256 10*3/uL (ref 140–400)
RBC: 4.24 10*6/uL (ref 3.80–5.10)
RDW: 12.4 % (ref 11.0–15.0)
Total Lymphocyte: 25.8 %
WBC: 7.9 10*3/uL (ref 3.8–10.8)

## 2019-06-25 LAB — COMPLETE METABOLIC PANEL WITH GFR
AG Ratio: 2 (calc) (ref 1.0–2.5)
ALT: 7 U/L (ref 6–29)
AST: 13 U/L (ref 10–35)
Albumin: 4.5 g/dL (ref 3.6–5.1)
Alkaline phosphatase (APISO): 95 U/L (ref 37–153)
BUN/Creatinine Ratio: 16 (calc) (ref 6–22)
BUN: 16 mg/dL (ref 7–25)
CO2: 29 mmol/L (ref 20–32)
Calcium: 9.9 mg/dL (ref 8.6–10.4)
Chloride: 105 mmol/L (ref 98–110)
Creat: 0.99 mg/dL — ABNORMAL HIGH (ref 0.60–0.93)
GFR, Est African American: 67 mL/min/{1.73_m2} (ref >=60)
GFR, Est Non African American: 58 mL/min/{1.73_m2} — ABNORMAL LOW (ref >=60)
Globulin: 2.2 g/dL (calc) (ref 1.9–3.7)
Glucose, Bld: 55 mg/dL — ABNORMAL LOW (ref 65–99)
Potassium: 3.9 mmol/L (ref 3.5–5.3)
Sodium: 142 mmol/L (ref 135–146)
Total Bilirubin: 0.4 mg/dL (ref 0.2–1.2)
Total Protein: 6.7 g/dL (ref 6.1–8.1)

## 2019-06-25 LAB — URINALYSIS, ROUTINE W REFLEX MICROSCOPIC
Bilirubin Urine: NEGATIVE
Glucose, UA: NEGATIVE
Hyaline Cast: NONE SEEN /LPF
Ketones, ur: NEGATIVE
Nitrite: POSITIVE — AB
Protein, ur: NEGATIVE
RBC / HPF: NONE SEEN /HPF (ref 0–2)
Specific Gravity, Urine: 1.009 (ref 1.001–1.03)
Squamous Epithelial / HPF: NONE SEEN /HPF (ref <=5)
pH: 6.5 (ref 5.0–8.0)

## 2019-06-25 LAB — LIPASE: Lipase: 37 U/L (ref 7–60)

## 2019-06-30 ENCOUNTER — Other Ambulatory Visit: Payer: Self-pay | Admitting: Family Medicine

## 2019-06-30 MED ORDER — CEPHALEXIN 500 MG PO CAPS: 500.0000 mg | ORAL_CAPSULE | Freq: Four times a day (QID) | ORAL | 0 refills | Status: AC

## 2019-08-06 DIAGNOSIS — H52213 Irregular astigmatism, bilateral: Secondary | ICD-10-CM | POA: Diagnosis not present

## 2019-09-16 ENCOUNTER — Other Ambulatory Visit: Payer: Self-pay

## 2019-09-16 ENCOUNTER — Ambulatory Visit (INDEPENDENT_AMBULATORY_CARE_PROVIDER_SITE_OTHER): Payer: Medicare HMO

## 2019-09-16 DIAGNOSIS — Z1231 Encounter for screening mammogram for malignant neoplasm of breast: Secondary | ICD-10-CM

## 2019-09-16 DIAGNOSIS — Z0000 Encounter for general adult medical examination without abnormal findings: Secondary | ICD-10-CM

## 2019-09-16 DIAGNOSIS — Z1211 Encounter for screening for malignant neoplasm of colon: Secondary | ICD-10-CM | POA: Diagnosis not present

## 2019-09-16 DIAGNOSIS — Z780 Asymptomatic menopausal state: Secondary | ICD-10-CM

## 2019-09-16 DIAGNOSIS — M8580 Other specified disorders of bone density and structure, unspecified site: Secondary | ICD-10-CM

## 2019-09-16 NOTE — Progress Notes (Signed)
Subjective:   Robin Jones is a 69 y.o. female who presents for Medicare Annual (Subsequent) preventive examination.  Virtual Visit via Telephone Note  I connected with Robin Jones on 09/16/19 at 10:00 AM EST by telephone and verified that I am speaking with the correct person using two identifiers.  Medicare Annual Wellness visit completed telephonically due to Covid-19 pandemic.   Location: Patient: home Provider: office   I discussed the limitations, risks, security and privacy concerns of performing an evaluation and management service by telephone and the availability of in person appointments. The patient expressed understanding and agreed to proceed.  Some vital signs may be absent or patient reported.   Clemetine Marker, LPN    Review of Systems:   Cardiac Risk Factors include: advanced age (>36men, >38 women);dyslipidemia;smoking/ tobacco exposure     Objective:     Vitals: There were no vitals taken for this visit.  There is no height or weight on file to calculate BMI.  Advanced Directives 09/16/2019 09/10/2018 08/12/2018 08/12/2018 05/07/2017 10/03/2016 09/11/2016  Does Patient Have a Medical Advance Directive? No No No No No No No  Would patient like information on creating a medical advance directive? No - Patient declined No - Patient declined No - Patient declined No - Patient declined - - -    Tobacco Social History   Tobacco Use  Smoking Status Current Every Day Smoker  . Packs/day: 0.25  . Years: 45.00  . Pack years: 11.25  . Types: Cigarettes  Smokeless Tobacco Never Used  Tobacco Comment   only smoking 3-4 a day, trying to quit     Ready to quit: Yes Counseling given: Yes Comment: only smoking 3-4 a day, trying to quit   Clinical Intake:  Pre-visit preparation completed: Yes  Pain : No/denies pain     Nutritional Risks: None Diabetes: No  How often do you need to have someone help you when you read instructions, pamphlets, or other written  materials from your doctor or pharmacy?: 1 - Never  Interpreter Needed?: No  Information entered by :: Clemetine Marker LPN  Past Medical History:  Diagnosis Date  . Aortic atherosclerosis ([place]) 10/03/2016  . Centrilobular emphysema ([place]) 10/03/2016   Noted on CT scan 2017  . Chronic kidney disease   . CKD (chronic kidney disease) stage 2, GFR 60-89 ml/min 03/03/2015  . Constipation   . Depression   . Depression, major, recurrent, in partial remission ([place]) 03/03/2015  . GERD (gastroesophageal reflux disease)   . Hearing loss of both ears   . Hyperlipidemia   . Insomnia 12/05/2015  . Osteopenia 12/09/2015  . Personal history of tobacco use, presenting hazards to health 03/23/2015  . Skin cancer of face 2013   Left side of face resected.    Past Surgical History:  Procedure Laterality Date  . ABDOMINAL HYSTERECTOMY  1980   partial  . BACK SURGERY  1978  . COLONOSCOPY N/A 12/22/2014   Procedure: COLONOSCOPY;  Surgeon: Lucilla Lame, MD;  Location: [facility] ENDOSCOPY;  Service: Endoscopy;  Laterality: N/A;  . ELECTROMAGNETIC NAVIGATION BROCHOSCOPY N/A 04/15/2015   Procedure: ELECTROMAGNETIC NAVIGATION BRONCHOSCOPY;  Surgeon: Flora Lipps, MD;  Location: [facility] ORS;  Service: Cardiopulmonary;  Laterality: N/A;  . ENDOBRONCHIAL ULTRASOUND N/A 04/15/2015   Procedure: ENDOBRONCHIAL ULTRASOUND;  Surgeon: Flora Lipps, MD;  Location: [facility] ORS;  Service: Cardiopulmonary;  Laterality: N/A;  . ENDOBRONCHIAL ULTRASOUND N/A 06/15/2015   Procedure: ENDOBRONCHIAL ULTRASOUND;  Surgeon: Flora Lipps, MD;  Location: [facility] ORS;  Service: Cardiopulmonary;  Laterality: N/A;  . INNER EAR SURGERY Left 2013  . TONSILLECTOMY  11   Family History  Problem Relation Age of Onset  . Cancer Sister        liver  . Thyroid disease Father   . Heart disease Mother   . Hepatitis C Sister   . Thyroid disease Sister   . Cancer Brother 55       in muscle of leg  . Heart disease Brother   . Arthritis Brother   . COPD Sister   .  Hypertension Sister   . Hyperlipidemia Sister   . Diabetes Maternal Grandmother   . Kidney disease Maternal Grandmother   . Arthritis Brother   . Breast cancer Maternal Aunt   . Breast cancer Maternal Aunt    Social History   Socioeconomic History  . Marital status: Divorced    Spouse name: Not on file  . Number of children: 1  . Years of education: Not on file  . Highest education level: 8th grade  Occupational History  . Not on file  Tobacco Use  . Smoking status: Current Every Day Smoker    Packs/day: 0.25    Years: 45.00    Pack years: 11.25    Types: Cigarettes  . Smokeless tobacco: Never Used  . Tobacco comment: only smoking 3-4 a day, trying to quit  Substance and Sexual Activity  . Alcohol use: No    Alcohol/week: 0.0 standard drinks  . Drug use: No  . Sexual activity: Not Currently  Other Topics Concern  . Not on file  Social History Narrative  . Not on file   Social Determinants of Health   Financial Resource Strain: Low Risk   . Difficulty of Paying Living Expenses: Not hard at all  Food Insecurity: No Food Insecurity  . Worried About Charity fundraiser in the Last Year: Never true  . Ran Out of Food in the Last Year: Never true  Transportation Needs: No Transportation Needs  . Lack of Transportation (Medical): No  . Lack of Transportation (Non-Medical): No  Physical Activity: Inactive  . Days of Exercise per Week: 0 days  . Minutes of Exercise per Session: 0 min  Stress: Stress Concern Present  . Feeling of Stress : Rather much  Social Connections: Moderately Isolated  . Frequency of Communication with Friends and Family: More than three times a week  . Frequency of Social Gatherings with Friends and Family: Three times a week  . Attends Religious Services: Never  . Active Member of Clubs or Organizations: No  . Attends Archivist Meetings: Never  . Marital Status: Divorced    Outpatient Encounter Medications as of 09/16/2019   Medication Sig  . acetaminophen (TYLENOL) 325 MG tablet Take 650 mg by mouth every 6 (six) hours as needed for moderate pain or headache.  . albuterol (VENTOLIN HFA) 108 (90 Base) MCG/ACT inhaler Inhale 1-2 puffs into the lungs every 6 (six) hours as needed for wheezing or shortness of breath.  Marland Kitchen aspirin EC 81 MG tablet Take 1 tablet (81 mg total) by mouth daily.  Marland Kitchen atorvastatin (LIPITOR) 80 MG tablet Take 1 tablet (80 mg total) by mouth at bedtime.  . cholecalciferol (VITAMIN D) 1000 units tablet Take 1,000 Units by mouth daily.  . citalopram (CELEXA) 20 MG tablet Take 1 tablet (20 mg total) by mouth daily.  . citalopram (CELEXA) 20 MG tablet Take 1 tablet (20 mg total) by mouth daily.  Marland Kitchen  fluticasone (FLONASE) 50 MCG/ACT nasal spray Place 2 sprays into both nostrils daily.  Marland Kitchen loratadine (CLARITIN) 10 MG tablet Take 1 tablet (10 mg total) by mouth daily.  . Tiotropium Bromide Monohydrate (SPIRIVA RESPIMAT) 2.5 MCG/ACT AERS Inhale 2 puffs into the lungs daily. Inhale 2 puffs daily into the lungs.   Facility-Administered Encounter Medications as of 09/16/2019  Medication  . albuterol (PROVENTIL) (2.5 MG/3ML) 0.083% nebulizer solution 2.5 mg    Activities of Daily Living In your present state of health, do you have any difficulty performing the following activities: 09/16/2019 06/24/2019  Hearing? Tempie Donning  Comment wears hearing aids -  Vision? N N  Difficulty concentrating or making decisions? N N  Walking or climbing stairs? N N  Dressing or bathing? N N  Doing errands, shopping? N N  Preparing Food and eating ? N -  Using the Toilet? N -  In the past six months, have you accidently leaked urine? N -  Do you have problems with loss of bowel control? N -  Managing your Medications? N -  Managing your Finances? N -  Housekeeping or managing your Housekeeping? N -  Some recent data might be hidden    Patient Care Team: Delsa Grana, PA-C as PCP - General (Family Medicine) Nestor Lewandowsky, MD  as Referring Physician (Cardiothoracic Surgery) Clyde Canterbury, MD as Referring Physician (Otolaryngology) Yolonda Kida, MD as Consulting Physician (Cardiology)    Assessment:   This is a routine wellness examination for Sennie.  Exercise Activities and Dietary recommendations Current Exercise Habits: The patient does not participate in regular exercise at present, Exercise limited by: None identified  Goals    . DIET - INCREASE WATER INTAKE     Recommend drinking 6-8 glasses of water per day.     . Patient Stated     Patient would like to complete advanced directives but states "I don't know how to fill the paperwork out" - recommended scheduling an appointment with provider to review and complete MOST form.     . Quit Smoking     If you wish to quit smoking, help is available. For free tobacco cessation program offerings call the [facility] at 445 642 1037 or Live Well Line at (503)642-9004. You may also visit [email or web address] or email [email or web address] for more information on other programs.         Fall Risk Fall Risk  09/16/2019 06/24/2019 05/23/2019 09/10/2018 07/25/2018  Falls in the past year? 0 1 0 0 0  Number falls in past yr: 0 0 0 0 0  Injury with Fall? 0 0 0 0 0  Risk for fall due to : No Fall Risks - - - -  Follow up Falls prevention discussed - - Falls prevention discussed -   FALL RISK PREVENTION PERTAINING TO THE HOME:  Any stairs in or around the home? No  If so, do they handrails? Pt has a ramp  Home free of loose throw rugs in walkways, pet beds, electrical cords, etc? Yes  Adequate lighting in your home to reduce risk of falls? Yes   ASSISTIVE DEVICES UTILIZED TO PREVENT FALLS:  Life alert? No  Use of a cane, walker or w/c? No  Grab bars in the bathroom? No  Shower chair or bench in shower? No  Elevated toilet seat or a handicapped toilet? No   DME ORDERS:  DME order needed?  No   TIMED UP AND GO:  Was the test  performed?  No . Telephonic visit.   Education: Fall risk prevention has been discussed.  Intervention(s) required? No   Depression Screen PHQ 2/9 Scores 09/16/2019 06/24/2019 05/23/2019 09/10/2018  PHQ - 2 Score 6 4 0 0  PHQ- 9 Score 17 16 0 5     Cognitive Function     6CIT Screen 09/16/2019 09/10/2018  What Year? 0 points 0 points  What month? 0 points 0 points  What time? 0 points 0 points  Count back from 20 0 points 0 points  Months in reverse 0 points 4 points  Repeat phrase 6 points 2 points  Total Score 6 6    Immunization History  Administered Date(s) Administered  . Fluad Quad(high Dose 65+) 05/23/2019  . Influenza, High Dose Seasonal PF 09/11/2016, 05/07/2017, 07/26/2018  . Influenza,inj,Quad PF,6+ Mos 06/22/2015  . Pneumococcal Conjugate-13 03/03/2015  . Pneumococcal Polysaccharide-23 10/03/2016  . Tdap 03/03/2015    Qualifies for Shingles Vaccine? Yes . Due for Shingrix. Education has been provided regarding the importance of this vaccine. Pt has been advised to call insurance company to determine out of pocket expense. Advised may also receive vaccine at local pharmacy or Health Dept. Verbalized acceptance and understanding.  Tdap: Up to date  Flu Vaccine: Up to date  Pneumococcal Vaccine: Up to date   Screening Tests Health Maintenance  Topic Date Due  . MAMMOGRAM  11/27/2018  . COLONOSCOPY  12/22/2019  . TETANUS/TDAP  03/02/2025  . INFLUENZA VACCINE  Completed  . DEXA SCAN  Completed  . Hepatitis C Screening  Completed  . PNA vac Low Risk Adult  Completed    Cancer Screenings:  Colorectal Screening: Completed 12/22/14. Repeat every 5 years. Referral to GI placed today. Pt aware the office will call re: appt.  Mammogram: Completed 11/26/17. Repeat every year. Ordered today. Pt provided with contact information and advised to call to schedule appt.   Bone Density: Completed 12/08/15. Results reflect  OSTEOPENIA. Repeat every 2 years. Ordered today. Pt  provided with contact information and advised to call to schedule appt.   Lung Cancer Screening: (Low Dose CT Chest recommended if Age 36-80 years, 30 pack-year currently smoking OR have quit w/in 15years.) does qualify. Completed 05/15/19.   Additional Screening:  Hepatitis C Screening: does qualify; Completed 12/09/15.  Vision Screening: Recommended annual ophthalmology exams for early detection of glaucoma and other disorders of the eye. Is the patient up to date with their annual eye exam?  Yes  Who is the provider or what is the name of the office in which the pt attends annual eye exams? Dr. Marvel Plan  Dental Screening: Recommended annual dental exams for proper oral hygiene  Community Resource Referral:  CRR required this visit?  No    Plan:    I have personally reviewed and addressed the Medicare Annual Wellness questionnaire and have noted the following in the patient's chart:  A. Medical and social history B. Use of alcohol, tobacco or illicit drugs  C. Current medications and supplements D. Functional ability and status E.  Nutritional status F.  Physical activity G. Advance directives H. List of other physicians I.  Hospitalizations, surgeries, and ER visits in previous 12 months J.  [place] such as hearing and vision if needed, cognitive and depression L. Referrals and appointments   In addition, I have reviewed and discussed with patient certain preventive protocols, quality metrics, and best practice recommendations. A written personalized care plan for preventive services as well as general preventive health  recommendations were provided to patient.   Signed,  Clemetine Marker, LPN Nurse Health Advisor   Nurse Notes: PHQ-9 today score of 17. Pt states she lost her brother in [place] at the end of December and has been having a hard time with that due to be unexpected from a heart attack. She only has one brother left who lives with her and is an  amputee and she worries about him and c/o not sleeping well at night which she feels is contributing to overall health. Advised patient to schedule an appt with Delsa Grana PAC to discuss difficulty sleeping and depression. Pt declined having me schedule the appointment or refer to C3 team for grief support counseling or community resources.   She is also interested in completing advanced directives but would like more information on how to complete them. Due to virtual visit I was unable to review in detail these documents and advised pt to schedule appt with provider to complete MOST form if interested.

## 2019-09-16 NOTE — Patient Instructions (Addendum)
Robin Jones , Thank you for taking time to come for your Medicare Wellness Visit. I appreciate your ongoing commitment to your health goals. Please review the following plan we discussed and let me know if I can assist you in the future.   Screening recommendations/referrals: Colonoscopy: done 12/30/14. Referral sent to [facility] today for repeat screening colonoscopy.  Mammogram: done 11/26/17. Please call [phone] to schedule your mammogram and bone density screening.  Bone Density: done 12/08/15 Recommended yearly ophthalmology/optometry visit for glaucoma screening and checkup Recommended yearly dental visit for hygiene and checkup  Vaccinations: Influenza vaccine: done 05/23/19 Pneumococcal vaccine: done 10/03/16 Tdap vaccine: done 03/03/15 Shingles vaccine: Shingrix discussed. Please contact your pharmacy for coverage information.   Advanced directives: Please bring a copy of your health care power of attorney and living will to the office at your convenience once you have completed those documents.   Conditions/risks identified: Recommend increasing physical activity.   If you wish to quit smoking, help is available. For free tobacco cessation program offerings call the [facility] at (579) 213-7234 or Live Well Line at 404 288 6511. You may also visit [email or web address] or email [email or web address] for more information on other programs.    Next appointment: Please follow up in one year for your Medicare Annual Wellness visit.     Preventive Care 49 Years and Older, Female Preventive care refers to lifestyle choices and visits with your health care provider that can promote health and wellness. What does preventive care include?  A yearly physical exam. This is also called an annual well check.  Dental exams once or twice a year.  Routine eye exams. Ask your health care provider how often you should have your eyes checked.  Personal lifestyle  choices, including:  Daily care of your teeth and gums.  Regular physical activity.  Eating a healthy diet.  Avoiding tobacco and drug use.  Limiting alcohol use.  Practicing safe sex.  Taking low-dose aspirin every day.  Taking vitamin and mineral supplements as recommended by your health care provider. What happens during an annual well check? The services and screenings done by your health care provider during your annual well check will depend on your age, overall health, lifestyle risk factors, and family history of disease. Counseling  Your health care provider may ask you questions about your:  Alcohol use.  Tobacco use.  Drug use.  Emotional well-being.  Home and relationship well-being.  Sexual activity.  Eating habits.  History of falls.  Memory and ability to understand (cognition).  Work and work Statistician.  Reproductive health. Screening  You may have the following tests or measurements:  Height, weight, and BMI.  Blood pressure.  Lipid and cholesterol levels. These may be checked every 5 years, or more frequently if you are over 33 years old.  Skin check.  Lung cancer screening. You may have this screening every year starting at age 10 if you have a 30-pack-year history of smoking and currently smoke or have quit within the past 15 years.  Fecal occult blood test (FOBT) of the stool. You may have this test every year starting at age 33.  Flexible sigmoidoscopy or colonoscopy. You may have a sigmoidoscopy every 5 years or a colonoscopy every 10 years starting at age 70.  Hepatitis C blood test.  Hepatitis B blood test.  Sexually transmitted disease (STD) testing.  Diabetes screening. This is done by checking your blood sugar (glucose) after you have not eaten for a while (  fasting). You may have this done every 1-3 years.  Bone density scan. This is done to screen for osteoporosis. You may have this done starting at age  56.  Mammogram. This may be done every 1-2 years. Talk to your health care provider about how often you should have regular mammograms. Talk with your health care provider about your test results, treatment options, and if necessary, the need for more tests. Vaccines  Your health care provider may recommend certain vaccines, such as:  Influenza vaccine. This is recommended every year.  Tetanus, diphtheria, and acellular pertussis (Tdap, Td) vaccine. You may need a Td booster every 10 years.  Zoster vaccine. You may need this after age 78.  Pneumococcal 13-valent conjugate (PCV13) vaccine. One dose is recommended after age 101.  Pneumococcal polysaccharide (PPSV23) vaccine. One dose is recommended after age 27. Talk to your health care provider about which screenings and vaccines you need and how often you need them. This information is not intended to replace advice given to you by your health care provider. Make sure you discuss any questions you have with your health care provider. Document Released: 08/20/2015 Document Revised: 04/12/2016 Document Reviewed: 05/25/2015 Elsevier Interactive Patient Education  2017 [employer] Prevention in the Home Falls can cause injuries. They can happen to people of all ages. There are many things you can do to make your home safe and to help prevent falls. What can I do on the outside of my home?  Regularly fix the edges of walkways and driveways and fix any cracks.  Remove anything that might make you trip as you walk through a door, such as a raised step or threshold.  Trim any bushes or trees on the path to your home.  Use bright outdoor lighting.  Clear any walking paths of anything that might make someone trip, such as rocks or tools.  Regularly check to see if handrails are loose or broken. Make sure that both sides of any steps have handrails.  Any raised decks and porches should have guardrails on the edges.  Have any  leaves, snow, or ice cleared regularly.  Use sand or salt on walking paths during winter.  Clean up any spills in your garage right away. This includes oil or grease spills. What can I do in the bathroom?  Use night lights.  Install grab bars by the toilet and in the tub and shower. Do not use towel bars as grab bars.  Use non-skid mats or decals in the tub or shower.  If you need to sit down in the shower, use a plastic, non-slip stool.  Keep the floor dry. Clean up any water that spills on the floor as soon as it happens.  Remove soap buildup in the tub or shower regularly.  Attach bath mats securely with double-sided non-slip rug tape.  Do not have throw rugs and other things on the floor that can make you trip. What can I do in the bedroom?  Use night lights.  Make sure that you have a light by your bed that is easy to reach.  Do not use any sheets or blankets that are too big for your bed. They should not hang down onto the floor.  Have a firm chair that has side arms. You can use this for support while you get dressed.  Do not have throw rugs and other things on the floor that can make you trip. What can I do in the kitchen?  Clean up any spills right away.  Avoid walking on wet floors.  Keep items that you use a lot in easy-to-reach places.  If you need to reach something above you, use a strong step stool that has a grab bar.  Keep electrical cords out of the way.  Do not use floor polish or wax that makes floors slippery. If you must use wax, use non-skid floor wax.  Do not have throw rugs and other things on the floor that can make you trip. What can I do with my stairs?  Do not leave any items on the stairs.  Make sure that there are handrails on both sides of the stairs and use them. Fix handrails that are broken or loose. Make sure that handrails are as long as the stairways.  Check any carpeting to make sure that it is firmly attached to the stairs.  Fix any carpet that is loose or worn.  Avoid having throw rugs at the top or bottom of the stairs. If you do have throw rugs, attach them to the floor with carpet tape.  Make sure that you have a light switch at the top of the stairs and the bottom of the stairs. If you do not have them, ask someone to add them for you. What else can I do to help prevent falls?  Wear shoes that:  Do not have high heels.  Have rubber bottoms.  Are comfortable and fit you well.  Are closed at the toe. Do not wear sandals.  If you use a stepladder:  Make sure that it is fully opened. Do not climb a closed stepladder.  Make sure that both sides of the stepladder are locked into place.  Ask someone to hold it for you, if possible.  Clearly mark and make sure that you can see:  Any grab bars or handrails.  First and last steps.  Where the edge of each step is.  Use tools that help you move around (mobility aids) if they are needed. These include:  Canes.  Walkers.  Scooters.  Crutches.  Turn on the lights when you go into a dark area. Replace any light bulbs as soon as they burn out.  Set up your furniture so you have a clear path. Avoid moving your furniture around.  If any of your floors are uneven, fix them.  If there are any pets around you, be aware of where they are.  Review your medicines with your doctor. Some medicines can make you feel dizzy. This can increase your chance of falling. Ask your doctor what other things that you can do to help prevent falls. This information is not intended to replace advice given to you by your health care provider. Make sure you discuss any questions you have with your health care provider. Document Released: 05/20/2009 Document Revised: 12/30/2015 Document Reviewed: 08/28/2014 Elsevier Interactive Patient Education  2017 [employer].

## 2019-09-18 ENCOUNTER — Other Ambulatory Visit: Payer: Self-pay

## 2019-09-18 ENCOUNTER — Telehealth: Payer: Self-pay

## 2019-09-18 DIAGNOSIS — Z1211 Encounter for screening for malignant neoplasm of colon: Secondary | ICD-10-CM

## 2019-09-18 NOTE — Telephone Encounter (Signed)
Gastroenterology Pre-Procedure Review  Request Date: Friday 10/03/19 Requesting Physician: Dr. Allen Norris  PATIENT REVIEW QUESTIONS: The patient responded to the following health history questions as indicated:    1. Are you having any GI issues? no 2. Do you have a personal history of Polyps? 2016 Dr. Allen Norris 3. Do you have a family history of Colon Cancer or Polyps? no 4. Diabetes Mellitus? no 5. Joint replacements in the past 12 months?no 6. Major health problems in the past 3 months?no 7. Any artificial heart valves, MVP, or defibrillator?no    MEDICATIONS & ALLERGIES:    Patient reports the following regarding taking any anticoagulation/antiplatelet therapy:   Plavix, Coumadin, Eliquis, Xarelto, Lovenox, Pradaxa, Brilinta, or Effient? no Aspirin? yes (81 mg daily)  Patient confirms/reports the following medications:  Current Outpatient Medications  Medication Sig Dispense Refill  . acetaminophen (TYLENOL) 325 MG tablet Take 650 mg by mouth every 6 (six) hours as needed for moderate pain or headache.    . albuterol (VENTOLIN HFA) 108 (90 Base) MCG/ACT inhaler Inhale 1-2 puffs into the lungs every 6 (six) hours as needed for wheezing or shortness of breath. 18 g 4  . aspirin EC 81 MG tablet Take 1 tablet (81 mg total) by mouth daily.    Marland Kitchen atorvastatin (LIPITOR) 80 MG tablet Take 1 tablet (80 mg total) by mouth at bedtime. 90 tablet 1  . cholecalciferol (VITAMIN D) 1000 units tablet Take 1,000 Units by mouth daily.    . citalopram (CELEXA) 20 MG tablet Take 1 tablet (20 mg total) by mouth daily. 30 tablet 0  . citalopram (CELEXA) 20 MG tablet Take 1 tablet (20 mg total) by mouth daily. 90 tablet 1  . fluticasone (FLONASE) 50 MCG/ACT nasal spray Place 2 sprays into both nostrils daily. 16 g 2  . loratadine (CLARITIN) 10 MG tablet Take 1 tablet (10 mg total) by mouth daily. 30 tablet 5  . Tiotropium Bromide Monohydrate (SPIRIVA RESPIMAT) 2.5 MCG/ACT AERS Inhale 2 puffs into the lungs daily.  Inhale 2 puffs daily into the lungs. 4 g 3   Current Facility-Administered Medications  Medication Dose Route Frequency Provider Last Rate Last Admin  . albuterol (PROVENTIL) (2.5 MG/3ML) 0.083% nebulizer solution 2.5 mg  2.5 mg Nebulization Once Poulose, Bethel Born, NP        Patient confirms/reports the following allergies:  Allergies  Allergen Reactions  . Trelegy Ellipta [Fluticasone-Umeclidin-Vilant] Other (See Comments)    Pt states that this made her mouth and throat very sore, unable to tolerate     No orders of the defined types were placed in this encounter.   AUTHORIZATION INFORMATION Primary Insurance: 1D#: Group #:  Secondary Insurance: 1D#: Group #:  SCHEDULE INFORMATION: Date: 10/03/19 Time: Location:[facility]

## 2019-10-01 ENCOUNTER — Other Ambulatory Visit
Admission: RE | Admit: 2019-10-01 | Discharge: 2019-10-01 | Disposition: A | Discharge status: Home / Self Care | Payer: Medicare HMO | Source: Ambulatory Visit | Attending: Gastroenterology | Admitting: Gastroenterology

## 2019-10-01 DIAGNOSIS — Z01812 Encounter for preprocedural laboratory examination: Secondary | ICD-10-CM | POA: Insufficient documentation

## 2019-10-01 DIAGNOSIS — Z20822 Contact with and (suspected) exposure to covid-19: Secondary | ICD-10-CM | POA: Insufficient documentation

## 2019-10-01 LAB — SARS CORONAVIRUS 2 (TAT 6-24 HRS): SARS Coronavirus 2: NEGATIVE

## 2019-10-03 ENCOUNTER — Other Ambulatory Visit: Payer: Self-pay

## 2019-10-03 ENCOUNTER — Encounter: Payer: Self-pay | Admitting: Gastroenterology

## 2019-10-03 ENCOUNTER — Ambulatory Visit
Admission: RE | Admit: 2019-10-03 | Discharge: 2019-10-03 | Disposition: A | Discharge status: Home / Self Care | Payer: Medicare HMO | Attending: Gastroenterology | Admitting: Gastroenterology

## 2019-10-03 ENCOUNTER — Encounter
Admission: RE | Disposition: A | Discharge status: Home / Self Care | Payer: Self-pay | Source: Home / Self Care | Attending: Gastroenterology

## 2019-10-03 ENCOUNTER — Ambulatory Visit: Payer: Medicare HMO | Admitting: Anesthesiology

## 2019-10-03 DIAGNOSIS — M8580 Other specified disorders of bone density and structure, unspecified site: Secondary | ICD-10-CM | POA: Diagnosis not present

## 2019-10-03 DIAGNOSIS — Z8261 Family history of arthritis: Secondary | ICD-10-CM | POA: Diagnosis not present

## 2019-10-03 DIAGNOSIS — K621 Rectal polyp: Secondary | ICD-10-CM | POA: Diagnosis not present

## 2019-10-03 DIAGNOSIS — Z7982 Long term (current) use of aspirin: Secondary | ICD-10-CM | POA: Insufficient documentation

## 2019-10-03 DIAGNOSIS — Z1211 Encounter for screening for malignant neoplasm of colon: Secondary | ICD-10-CM | POA: Diagnosis not present

## 2019-10-03 DIAGNOSIS — Z8349 Family history of other endocrine, nutritional and metabolic diseases: Secondary | ICD-10-CM | POA: Insufficient documentation

## 2019-10-03 DIAGNOSIS — Z90710 Acquired absence of both cervix and uterus: Secondary | ICD-10-CM | POA: Insufficient documentation

## 2019-10-03 DIAGNOSIS — Z833 Family history of diabetes mellitus: Secondary | ICD-10-CM | POA: Insufficient documentation

## 2019-10-03 DIAGNOSIS — I2510 Atherosclerotic heart disease of native coronary artery without angina pectoris: Secondary | ICD-10-CM | POA: Insufficient documentation

## 2019-10-03 DIAGNOSIS — Z860100 Personal history of colon polyps, unspecified: Secondary | ICD-10-CM

## 2019-10-03 DIAGNOSIS — J432 Centrilobular emphysema: Secondary | ICD-10-CM | POA: Insufficient documentation

## 2019-10-03 DIAGNOSIS — Z808 Family history of malignant neoplasm of other organs or systems: Secondary | ICD-10-CM | POA: Insufficient documentation

## 2019-10-03 DIAGNOSIS — Z8249 Family history of ischemic heart disease and other diseases of the circulatory system: Secondary | ICD-10-CM | POA: Insufficient documentation

## 2019-10-03 DIAGNOSIS — D122 Benign neoplasm of ascending colon: Secondary | ICD-10-CM | POA: Insufficient documentation

## 2019-10-03 DIAGNOSIS — E0590 Thyrotoxicosis, unspecified without thyrotoxic crisis or storm: Secondary | ICD-10-CM | POA: Insufficient documentation

## 2019-10-03 DIAGNOSIS — I129 Hypertensive chronic kidney disease with stage 1 through stage 4 chronic kidney disease, or unspecified chronic kidney disease: Secondary | ICD-10-CM | POA: Diagnosis not present

## 2019-10-03 DIAGNOSIS — Z841 Family history of disorders of kidney and ureter: Secondary | ICD-10-CM | POA: Insufficient documentation

## 2019-10-03 DIAGNOSIS — G4700 Insomnia, unspecified: Secondary | ICD-10-CM | POA: Diagnosis not present

## 2019-10-03 DIAGNOSIS — E1122 Type 2 diabetes mellitus with diabetic chronic kidney disease: Secondary | ICD-10-CM | POA: Diagnosis not present

## 2019-10-03 DIAGNOSIS — E785 Hyperlipidemia, unspecified: Secondary | ICD-10-CM | POA: Insufficient documentation

## 2019-10-03 DIAGNOSIS — D125 Benign neoplasm of sigmoid colon: Secondary | ICD-10-CM | POA: Insufficient documentation

## 2019-10-03 DIAGNOSIS — Z79899 Other long term (current) drug therapy: Secondary | ICD-10-CM | POA: Insufficient documentation

## 2019-10-03 DIAGNOSIS — M1990 Unspecified osteoarthritis, unspecified site: Secondary | ICD-10-CM | POA: Insufficient documentation

## 2019-10-03 DIAGNOSIS — K219 Gastro-esophageal reflux disease without esophagitis: Secondary | ICD-10-CM | POA: Diagnosis not present

## 2019-10-03 DIAGNOSIS — F329 Major depressive disorder, single episode, unspecified: Secondary | ICD-10-CM | POA: Insufficient documentation

## 2019-10-03 DIAGNOSIS — I700 Atherosclerosis of aorta: Secondary | ICD-10-CM | POA: Insufficient documentation

## 2019-10-03 DIAGNOSIS — Z803 Family history of malignant neoplasm of breast: Secondary | ICD-10-CM | POA: Insufficient documentation

## 2019-10-03 DIAGNOSIS — Z85828 Personal history of other malignant neoplasm of skin: Secondary | ICD-10-CM | POA: Insufficient documentation

## 2019-10-03 DIAGNOSIS — Z800 Family history of malignant neoplasm of digestive organs: Secondary | ICD-10-CM | POA: Insufficient documentation

## 2019-10-03 DIAGNOSIS — N182 Chronic kidney disease, stage 2 (mild): Secondary | ICD-10-CM | POA: Insufficient documentation

## 2019-10-03 DIAGNOSIS — K635 Polyp of colon: Secondary | ICD-10-CM

## 2019-10-03 DIAGNOSIS — D128 Benign neoplasm of rectum: Secondary | ICD-10-CM | POA: Diagnosis not present

## 2019-10-03 DIAGNOSIS — F17210 Nicotine dependence, cigarettes, uncomplicated: Secondary | ICD-10-CM | POA: Insufficient documentation

## 2019-10-03 DIAGNOSIS — K5730 Diverticulosis of large intestine without perforation or abscess without bleeding: Secondary | ICD-10-CM | POA: Insufficient documentation

## 2019-10-03 DIAGNOSIS — I739 Peripheral vascular disease, unspecified: Secondary | ICD-10-CM | POA: Insufficient documentation

## 2019-10-03 HISTORY — PX: COLONOSCOPY WITH PROPOFOL: SHX5780

## 2019-10-03 SURGERY — COLONOSCOPY WITH PROPOFOL
Anesthesia: General

## 2019-10-03 MED ORDER — LABETALOL HCL 5 MG/ML IV SOLN
INTRAVENOUS | Status: AC
  Filled 2019-10-03: qty 4

## 2019-10-03 MED ORDER — PROPOFOL 10 MG/ML IV BOLUS
INTRAVENOUS | Status: DC | PRN
  Administered 2019-10-03: 20 mg via INTRAVENOUS
  Administered 2019-10-03: 50 mg via INTRAVENOUS
  Administered 2019-10-03: 30 mg via INTRAVENOUS

## 2019-10-03 MED ORDER — PROPOFOL 500 MG/50ML IV EMUL
INTRAVENOUS | Status: DC | PRN
  Administered 2019-10-03: 100 ug/kg/min via INTRAVENOUS

## 2019-10-03 MED ORDER — LIDOCAINE HCL (CARDIAC) PF 100 MG/5ML IV SOSY
PREFILLED_SYRINGE | INTRAVENOUS | Status: DC | PRN
  Administered 2019-10-03: 100 mg via INTRAVENOUS

## 2019-10-03 MED ORDER — SODIUM CHLORIDE 0.9 % IV SOLN
INTRAVENOUS | Status: DC
  Administered 2019-10-03: 1000 mL via INTRAVENOUS

## 2019-10-03 MED ORDER — PHENYLEPHRINE HCL (PRESSORS) 10 MG/ML IV SOLN
INTRAVENOUS | Status: AC
  Filled 2019-10-03: qty 1

## 2019-10-03 MED ORDER — LIDOCAINE HCL (PF) 2 % IJ SOLN
INTRAMUSCULAR | Status: AC
  Filled 2019-10-03: qty 10

## 2019-10-03 MED ORDER — SODIUM CHLORIDE (PF) 0.9 % IJ SOLN
INTRAMUSCULAR | Status: AC
  Filled 2019-10-03: qty 20

## 2019-10-03 MED ORDER — LABETALOL HCL 5 MG/ML IV SOLN
INTRAVENOUS | Status: DC | PRN
  Administered 2019-10-03: 10 mg via INTRAVENOUS

## 2019-10-03 MED ORDER — PROPOFOL 10 MG/ML IV BOLUS
INTRAVENOUS | Status: AC
  Filled 2019-10-03: qty 100

## 2019-10-03 NOTE — Op Note (Signed)
[facility] Gastroenterology Patient Name: Robin Jones Procedure Date: 10/03/2019 7:49 AM MRN: [id number] Account #: [id number] Date of Birth: 1950/11/20 Admit Type: Outpatient Age: 69 Room: [facility] ENDO ROOM 4 Gender: Female Note Status: Finalized Procedure:             Colonoscopy Indications:           High risk colon cancer surveillance: Personal history                         of colonic polyps Providers:             Lucilla Lame MD, MD Referring MD:          Zella Richer. Tapper (Referring MD) Medicines:             Propofol per Anesthesia Complications:         No immediate complications. Procedure:             Pre-Anesthesia Assessment:                        - Prior to the procedure, a History and Physical was                         performed, and patient medications and allergies were                         reviewed. The patient's tolerance of previous                         anesthesia was also reviewed. The risks and benefits                         of the procedure and the sedation options and risks                         were discussed with the patient. All questions were                         answered, and informed consent was obtained. Prior                         Anticoagulants: The patient has taken no previous                         anticoagulant or antiplatelet agents. ASA Grade                         Assessment: III - A patient with severe systemic                         disease. After reviewing the risks and benefits, the                         patient was deemed in satisfactory condition to                         undergo the procedure.  After obtaining informed consent, the colonoscope was                         passed under direct vision. Throughout the procedure,                         the patient's blood pressure, pulse, and oxygen                         saturations were monitored continuously. The              Colonoscope was introduced through the anus and                         advanced to the the cecum, identified by appendiceal                         orifice and ileocecal valve. The colonoscopy was                         technically difficult and complex due to a tortuous                         colon. Successful completion of the procedure was                         aided by withdrawing the scope and replacing with the                         pediatric colonoscope. The patient tolerated the                         procedure well. The quality of the bowel preparation                         was fair. Findings:      The perianal and digital rectal examinations were normal.      A 4 mm polyp was found in the rectum. The polyp was sessile. The polyp       was removed with a cold snare. Resection and retrieval were complete.      Two sessile polyps were found in the ascending colon. The polyps were 2       to 6 mm in size. These polyps were removed with a cold snare. Resection       and retrieval were complete.      A 4 mm polyp was found in the sigmoid colon. The polyp was sessile. The       polyp was removed with a cold snare. Resection and retrieval were       complete.      Multiple small-mouthed diverticula were found in the sigmoid colon. Impression:            - Preparation of the colon was fair.                        - One 4 mm polyp in the rectum, removed with a cold  snare. Resected and retrieved.                        - Two 2 to 6 mm polyps in the ascending colon, removed                         with a cold snare. Resected and retrieved.                        - One 4 mm polyp in the sigmoid colon, removed with a                         cold snare. Resected and retrieved.                        - Diverticulosis in the sigmoid colon. Recommendation:        - Discharge patient to home.                        - Resume previous diet.                         - Continue present medications.                        - Await pathology results.                        - Repeat colonoscopy in 5 years for surveillance. Procedure Code(s):     --- Professional ---                        [phone], Colonoscopy, flexible; with removal of                         tumor(s), polyp(s), or other lesion(s) by snare                         technique Diagnosis Code(s):     --- Professional ---                        Z86.010, Personal history of colonic polyps                        K62.1, Rectal polyp                        K63.5, Polyp of colon CPT copyright 2019 American Medical Association. All rights reserved. The codes documented in this report are preliminary and upon coder review may  be revised to meet current compliance requirements. Lucilla Lame MD, MD 10/03/2019 8:25:52 AM This report has been signed electronically. Number of Addenda: 0 Note Initiated On: 10/03/2019 7:49 AM Scope Withdrawal Time: 0 hours 6 minutes 25 seconds  Total Procedure Duration: 0 hours 29 minutes 13 seconds  Estimated Blood Loss:  Estimated blood loss: none.      [facility]

## 2019-10-03 NOTE — H&P (Signed)
Lucilla Lame, MD [address] [place], [place] [address] Phone:[phone] Fax : [phone]  Primary Care Physician:  Delsa Grana, PA-C Primary Gastroenterologist:  Dr. Allen Norris  Pre-Procedure History & Physical: HPI:  Robin Jones is a 69 y.o. female is here for an colonoscopy.   Past Medical History:  Diagnosis Date  . Aortic atherosclerosis ([place]) 10/03/2016  . Centrilobular emphysema ([place]) 10/03/2016   Noted on CT scan 2017  . Chronic kidney disease   . CKD (chronic kidney disease) stage 2, GFR 60-89 ml/min 03/03/2015  . Constipation   . Depression   . Depression, major, recurrent, in partial remission ([place]) 03/03/2015  . GERD (gastroesophageal reflux disease)   . Hearing loss of both ears   . Hyperlipidemia   . Insomnia 12/05/2015  . Osteopenia 12/09/2015  . Personal history of tobacco use, presenting hazards to health 03/23/2015  . Skin cancer of face 2013   Left side of face resected.     Past Surgical History:  Procedure Laterality Date  . ABDOMINAL HYSTERECTOMY  1980   partial  . BACK SURGERY  1978  . COLONOSCOPY N/A 12/22/2014   Procedure: COLONOSCOPY;  Surgeon: Lucilla Lame, MD;  Location: [facility] ENDOSCOPY;  Service: Endoscopy;  Laterality: N/A;  . ELECTROMAGNETIC NAVIGATION BROCHOSCOPY N/A 04/15/2015   Procedure: ELECTROMAGNETIC NAVIGATION BRONCHOSCOPY;  Surgeon: Flora Lipps, MD;  Location: [facility] ORS;  Service: Cardiopulmonary;  Laterality: N/A;  . ENDOBRONCHIAL ULTRASOUND N/A 04/15/2015   Procedure: ENDOBRONCHIAL ULTRASOUND;  Surgeon: Flora Lipps, MD;  Location: [facility] ORS;  Service: Cardiopulmonary;  Laterality: N/A;  . ENDOBRONCHIAL ULTRASOUND N/A 06/15/2015   Procedure: ENDOBRONCHIAL ULTRASOUND;  Surgeon: Flora Lipps, MD;  Location: [facility] ORS;  Service: Cardiopulmonary;  Laterality: N/A;  . INNER EAR SURGERY Left 2013  . TONSILLECTOMY  13    Prior to Admission medications   Medication Sig Start Date End Date Taking? Authorizing Provider  acetaminophen  (TYLENOL) 325 MG tablet Take 650 mg by mouth every 6 (six) hours as needed for moderate pain or headache.   Yes [provider]  albuterol (VENTOLIN HFA) 108 (90 Base) MCG/ACT inhaler Inhale 1-2 puffs into the lungs every 6 (six) hours as needed for wheezing or shortness of breath. 05/23/19  Yes Delsa Grana, PA-C  aspirin EC 81 MG tablet Take 1 tablet (81 mg total) by mouth daily. 10/03/16  Yes Lada, Satira Anis, MD  atorvastatin (LIPITOR) 80 MG tablet Take 1 tablet (80 mg total) by mouth at bedtime. 06/24/19  Yes Delsa Grana, PA-C  cholecalciferol (VITAMIN D) 1000 units tablet Take 1,000 Units by mouth daily.   Yes [provider]  citalopram (CELEXA) 20 MG tablet Take 1 tablet (20 mg total) by mouth daily. 06/24/19  Yes Delsa Grana, PA-C  citalopram (CELEXA) 20 MG tablet Take 1 tablet (20 mg total) by mouth daily. 07/21/19  Yes Delsa Grana, PA-C  fluticasone (FLONASE) 50 MCG/ACT nasal spray Place 2 sprays into both nostrils daily. 05/23/19  Yes Delsa Grana, PA-C  loratadine (CLARITIN) 10 MG tablet Take 1 tablet (10 mg total) by mouth daily. 05/23/19  Yes Delsa Grana, PA-C  Tiotropium Bromide Monohydrate (SPIRIVA RESPIMAT) 2.5 MCG/ACT AERS Inhale 2 puffs into the lungs daily. Inhale 2 puffs daily into the lungs. 05/23/19  Yes Delsa Grana, PA-C    Allergies as of 09/19/2019 - Review Complete 06/24/2019  Allergen Reaction Noted  . Trelegy ellipta [fluticasone-umeclidin-vilant] Other (See Comments) 11/29/2017    Family History  Problem Relation Age of Onset  .  Cancer Sister        liver  . Thyroid disease Father   . Heart disease Mother   . Hepatitis C Sister   . Thyroid disease Sister   . Cancer Brother 55       in muscle of leg  . Heart disease Brother   . Arthritis Brother   . COPD Sister   . Hypertension Sister   . Hyperlipidemia Sister   . Diabetes Maternal Grandmother   . Kidney disease Maternal Grandmother   . Arthritis Brother   . Breast cancer Maternal  Aunt   . Breast cancer Maternal Aunt     Social History   Socioeconomic History  . Marital status: Divorced    Spouse name: Not on file  . Number of children: 1  . Years of education: Not on file  . Highest education level: 8th grade  Occupational History  . Not on file  Tobacco Use  . Smoking status: Current Every Day Smoker    Packs/day: 0.25    Years: 45.00    Pack years: 11.25    Types: Cigarettes  . Smokeless tobacco: Never Used  . Tobacco comment: only smoking 3-4 a day, trying to quit  Substance and Sexual Activity  . Alcohol use: No    Alcohol/week: 0.0 standard drinks  . Drug use: No  . Sexual activity: Not Currently  Other Topics Concern  . Not on file  Social History Narrative  . Not on file   Social Determinants of Health   Financial Resource Strain: Low Risk   . Difficulty of Paying Living Expenses: Not hard at all  Food Insecurity: No Food Insecurity  . Worried About Charity fundraiser in the Last Year: Never true  . Ran Out of Food in the Last Year: Never true  Transportation Needs: No Transportation Needs  . Lack of Transportation (Medical): No  . Lack of Transportation (Non-Medical): No  Physical Activity: Inactive  . Days of Exercise per Week: 0 days  . Minutes of Exercise per Session: 0 min  Stress: Stress Concern Present  . Feeling of Stress : Rather much  Social Connections: Moderately Isolated  . Frequency of Communication with Friends and Family: More than three times a week  . Frequency of Social Gatherings with Friends and Family: Three times a week  . Attends Religious Services: Never  . Active Member of Clubs or Organizations: No  . Attends Archivist Meetings: Never  . Marital Status: Divorced  Human resources officer Violence:   . Fear of Current or Ex-Partner: Not on file  . Emotionally Abused: Not on file  . Physically Abused: Not on file  . Sexually Abused: Not on file    Review of Systems: See HPI, otherwise negative  ROS  Physical Exam: BP 136/81   Pulse 83   Temp (!) 96.9 F (36.1 C) (Temporal)   Resp 18   Ht 5\' 4"  (1.626 m) Comment: 5'4' ft  Wt 50 kg Comment: lbs  SpO2 100%   BMI 18.92 kg/m  General:   Alert,  pleasant and cooperative in NAD Head:  Normocephalic and atraumatic. Neck:  Supple; no masses or thyromegaly. Lungs:  Clear throughout to auscultation.    Heart:  Regular rate and rhythm. Abdomen:  Soft, nontender and nondistended. Normal bowel sounds, without guarding, and without rebound.   Neurologic:  Alert and  oriented x4;  grossly normal neurologically.  Impression/Plan: Robin Jones is here for an colonoscopy to be  performed for history of adenomatous polyps 5.2015  Risks, benefits, limitations, and alternatives regarding  colonoscopy have been reviewed with the patient.  Questions have been answered.  All parties agreeable.   Lucilla Lame, MD  10/03/2019, 7:37 AM

## 2019-10-03 NOTE — Transfer of Care (Signed)
Immediate Anesthesia Transfer of Care Note  Patient: Robin Jones  Procedure(s) Performed: COLONOSCOPY WITH PROPOFOL (N/A )  Patient Location: PACU  Anesthesia Type:General  Level of Consciousness: drowsy and patient cooperative  Airway & Oxygen Therapy: Patient Spontanous Breathing  Post-op Assessment: Report given to RN, Post -op Vital signs reviewed and stable and Patient moving all extremities  Post vital signs: Reviewed and stable  Last Vitals:  Vitals Value Taken Time  BP 106/56 10/03/19 0828  Temp 36.7 C 10/03/19 0828  Pulse 69 10/03/19 0829  Resp 23 10/03/19 0829  SpO2 95 % 10/03/19 0829  Vitals shown include unvalidated device data.  Last Pain:  Vitals:   10/03/19 0828  TempSrc: Temporal  PainSc: Asleep      Patients Stated Pain Goal: 0 ([id number])  Complications: No apparent anesthesia complications

## 2019-10-03 NOTE — Anesthesia Preprocedure Evaluation (Addendum)
Anesthesia Evaluation  Patient identified by MRN, date of birth, ID band Patient awake    Reviewed: Allergy & Precautions, NPO status , Patient's Chart, lab work & pertinent test results  Airway Mallampati: II  TM Distance: >3 FB Neck ROM: Full    Dental  (+) Upper Dentures, Lower Dentures   Pulmonary shortness of breath and with exertion, COPD,  COPD inhaler, Current Smoker,           Cardiovascular hypertension (off meds now), + CAD and + Peripheral Vascular Disease       Neuro/Psych PSYCHIATRIC DISORDERS Depression    GI/Hepatic Neg liver ROS, GERD  Medicated and Controlled,  Endo/Other  Hyperthyroidism   Renal/GU Renal InsufficiencyRenal disease  negative genitourinary   Musculoskeletal  (+) Arthritis , Osteoarthritis,    Abdominal   Peds negative pediatric ROS (+)  Hematology   Anesthesia Other Findings Past Medical History: 10/03/2016: Aortic atherosclerosis ([place]) 10/03/2016: Centrilobular emphysema ([place])     Comment:  Noted on CT scan 2017 No date: Chronic kidney disease 03/03/2015: CKD (chronic kidney disease) stage 2, GFR 60-89 ml/min No date: Constipation No date: Depression 03/03/2015: Depression, major, recurrent, in partial remission ([place]) No date: GERD (gastroesophageal reflux disease) No date: Hearing loss of both ears No date: Hyperlipidemia 12/05/2015: Insomnia 12/09/2015: Osteopenia 03/23/2015: Personal history of tobacco use, presenting hazards to  health 2013: Skin cancer of face     Comment:  Left side of face resected.   Reproductive/Obstetrics                           Anesthesia Physical  Anesthesia Plan  ASA: III  Anesthesia Plan: General   Post-op Pain Management:    Induction: Intravenous  PONV Risk Score and Plan: Propofol infusion  Airway Management Planned: Nasal Cannula  Additional Equipment:   Intra-op Plan:   Post-operative Plan:   Informed  Consent: I have reviewed the patients History and Physical, chart, labs and discussed the procedure including the risks, benefits and alternatives for the proposed anesthesia with the patient or authorized representative who has indicated his/her understanding and acceptance.       Plan Discussed with: CRNA and Surgeon  Anesthesia Plan Comments:         Anesthesia Quick Evaluation

## 2019-10-04 NOTE — Anesthesia Postprocedure Evaluation (Signed)
Anesthesia Post Note  Patient: Robin Jones  Procedure(s) Performed: COLONOSCOPY WITH PROPOFOL (N/A )  Patient location during evaluation: Endoscopy Anesthesia Type: General Level of consciousness: awake and alert and oriented Pain management: pain level controlled Vital Signs Assessment: post-procedure vital signs reviewed and stable Respiratory status: spontaneous breathing Cardiovascular status: blood pressure returned to baseline Anesthetic complications: no     Last Vitals:  Vitals:   10/03/19 0848 10/03/19 0858  BP: 140/67 122/76  Pulse:    Resp:    Temp:    SpO2:      Last Pain:  Vitals:   10/03/19 0858  TempSrc:   PainSc: 0-No pain                 Crystalle Popwell

## 2019-10-06 LAB — SURGICAL PATHOLOGY

## 2019-10-08 ENCOUNTER — Encounter: Payer: Self-pay | Admitting: Gastroenterology

## 2019-10-08 ENCOUNTER — Other Ambulatory Visit: Payer: Self-pay

## 2019-10-20 ENCOUNTER — Other Ambulatory Visit: Payer: Medicare HMO

## 2020-02-10 ENCOUNTER — Other Ambulatory Visit: Payer: Self-pay | Admitting: Family Medicine

## 2020-02-10 DIAGNOSIS — J432 Centrilobular emphysema: Secondary | ICD-10-CM

## 2020-02-11 NOTE — Telephone Encounter (Signed)
lft message with Jeneen Rinks (her brother) asking her to return our call. Pt need to sch appt for refills

## 2020-02-11 NOTE — Telephone Encounter (Signed)
Pt needs appt for f/u and refills

## 2020-03-04 ENCOUNTER — Other Ambulatory Visit: Payer: Self-pay | Admitting: Family Medicine

## 2020-03-04 DIAGNOSIS — E785 Hyperlipidemia, unspecified: Secondary | ICD-10-CM

## 2020-03-22 ENCOUNTER — Ambulatory Visit (INDEPENDENT_AMBULATORY_CARE_PROVIDER_SITE_OTHER): Payer: Medicare HMO | Admitting: Family Medicine

## 2020-03-22 ENCOUNTER — Encounter: Payer: Self-pay | Admitting: Family Medicine

## 2020-03-22 ENCOUNTER — Other Ambulatory Visit: Payer: Self-pay

## 2020-03-22 VITALS — BP 136/78 | HR 88 | Temp 98.0°F | Resp 16 | Ht 64.0 in | Wt 119.2 lb

## 2020-03-22 DIAGNOSIS — E0590 Thyrotoxicosis, unspecified without thyrotoxic crisis or storm: Secondary | ICD-10-CM

## 2020-03-22 DIAGNOSIS — M8580 Other specified disorders of bone density and structure, unspecified site: Secondary | ICD-10-CM

## 2020-03-22 DIAGNOSIS — I25119 Atherosclerotic heart disease of native coronary artery with unspecified angina pectoris: Secondary | ICD-10-CM | POA: Diagnosis not present

## 2020-03-22 DIAGNOSIS — E785 Hyperlipidemia, unspecified: Secondary | ICD-10-CM | POA: Diagnosis not present

## 2020-03-22 DIAGNOSIS — Z720 Tobacco use: Secondary | ICD-10-CM

## 2020-03-22 DIAGNOSIS — F3341 Major depressive disorder, recurrent, in partial remission: Secondary | ICD-10-CM

## 2020-03-22 DIAGNOSIS — Z5181 Encounter for therapeutic drug level monitoring: Secondary | ICD-10-CM | POA: Diagnosis not present

## 2020-03-22 DIAGNOSIS — J432 Centrilobular emphysema: Secondary | ICD-10-CM

## 2020-03-22 DIAGNOSIS — R9389 Abnormal findings on diagnostic imaging of other specified body structures: Secondary | ICD-10-CM

## 2020-03-22 DIAGNOSIS — J449 Chronic obstructive pulmonary disease, unspecified: Secondary | ICD-10-CM | POA: Diagnosis not present

## 2020-03-22 DIAGNOSIS — I700 Atherosclerosis of aorta: Secondary | ICD-10-CM

## 2020-03-22 DIAGNOSIS — R079 Chest pain, unspecified: Secondary | ICD-10-CM

## 2020-03-22 DIAGNOSIS — D692 Other nonthrombocytopenic purpura: Secondary | ICD-10-CM

## 2020-03-22 DIAGNOSIS — N182 Chronic kidney disease, stage 2 (mild): Secondary | ICD-10-CM

## 2020-03-22 DIAGNOSIS — J309 Allergic rhinitis, unspecified: Secondary | ICD-10-CM | POA: Diagnosis not present

## 2020-03-22 DIAGNOSIS — F331 Major depressive disorder, recurrent, moderate: Secondary | ICD-10-CM

## 2020-03-22 DIAGNOSIS — R911 Solitary pulmonary nodule: Secondary | ICD-10-CM

## 2020-03-22 LAB — CBC WITH DIFFERENTIAL/PLATELET
Absolute Monocytes: 504 cells/uL (ref 200–950)
Basophils Absolute: 72 cells/uL (ref 0–200)
Basophils Relative: 1 %
Eosinophils Absolute: 43 cells/uL (ref 15–500)
Eosinophils Relative: 0.6 %
HCT: 37.6 % (ref 35.0–45.0)
Hemoglobin: 12.7 g/dL (ref 11.7–15.5)
Lymphs Abs: 1678 cells/uL (ref 850–3900)
MCH: 30.4 pg (ref 27.0–33.0)
MCHC: 33.8 g/dL (ref 32.0–36.0)
MCV: 90 fL (ref 80.0–100.0)
MPV: 10.5 fL (ref 7.5–12.5)
Monocytes Relative: 7 %
Neutro Abs: 4903 cells/uL (ref 1500–7800)
Neutrophils Relative %: 68.1 %
Platelets: 248 10*3/uL (ref 140–400)
RBC: 4.18 10*6/uL (ref 3.80–5.10)
RDW: 12.7 % (ref 11.0–15.0)
Total Lymphocyte: 23.3 %
WBC: 7.2 10*3/uL (ref 3.8–10.8)

## 2020-03-22 LAB — COMPLETE METABOLIC PANEL WITH GFR
AG Ratio: 1.9 (calc) (ref 1.0–2.5)
ALT: 5 U/L — ABNORMAL LOW (ref 6–29)
AST: 11 U/L (ref 10–35)
Albumin: 4.2 g/dL (ref 3.6–5.1)
Alkaline phosphatase (APISO): 114 U/L (ref 37–153)
BUN/Creatinine Ratio: 7 (calc) (ref 6–22)
BUN: 6 mg/dL — ABNORMAL LOW (ref 7–25)
CO2: 31 mmol/L (ref 20–32)
Calcium: 9.5 mg/dL (ref 8.6–10.4)
Chloride: 101 mmol/L (ref 98–110)
Creat: 0.92 mg/dL (ref 0.60–0.93)
GFR, Est African American: 73 mL/min/{1.73_m2} (ref >=60)
GFR, Est Non African American: 63 mL/min/{1.73_m2} (ref >=60)
Globulin: 2.2 g/dL (calc) (ref 1.9–3.7)
Glucose, Bld: 58 mg/dL — ABNORMAL LOW (ref 65–139)
Potassium: 3.7 mmol/L (ref 3.5–5.3)
Sodium: 138 mmol/L (ref 135–146)
Total Bilirubin: 0.7 mg/dL (ref 0.2–1.2)
Total Protein: 6.4 g/dL (ref 6.1–8.1)

## 2020-03-22 LAB — LIPID PANEL
Cholesterol: 126 mg/dL (ref <200)
HDL: 49 mg/dL — ABNORMAL LOW (ref >=50)
LDL Cholesterol (Calc): 57 mg/dL (calc)
Non-HDL Cholesterol (Calc): 77 mg/dL (calc) (ref <130)
Total CHOL/HDL Ratio: 2.6 (calc) (ref <5.0)
Triglycerides: 113 mg/dL (ref <150)

## 2020-03-22 LAB — TSH: TSH: 0.94 mIU/L (ref 0.40–4.50)

## 2020-03-22 LAB — T4, FREE: Free T4: 1 ng/dL (ref 0.8–1.8)

## 2020-03-22 MED ORDER — LORATADINE 10 MG PO TABS: 10.0000 mg | ORAL_TABLET | Freq: Every day | ORAL | 11 refills | Status: DC

## 2020-03-22 MED ORDER — ALBUTEROL SULFATE HFA 108 (90 BASE) MCG/ACT IN AERS: 1.0000 | INHALATION_SPRAY | Freq: Four times a day (QID) | RESPIRATORY_TRACT | 4 refills | Status: AC | PRN

## 2020-03-22 MED ORDER — ATORVASTATIN CALCIUM 80 MG PO TABS: 80.0000 mg | ORAL_TABLET | Freq: Every day | ORAL | 3 refills | Status: AC

## 2020-03-22 MED ORDER — FLUTICASONE PROPIONATE 50 MCG/ACT NA SUSP: 2.0000 | Freq: Every day | NASAL | 5 refills | Status: AC

## 2020-03-22 MED ORDER — CITALOPRAM HYDROBROMIDE 20 MG PO TABS: 20.0000 mg | ORAL_TABLET | Freq: Every day | ORAL | 3 refills | Status: DC

## 2020-03-22 MED ORDER — SPIRIVA RESPIMAT 2.5 MCG/ACT IN AERS: 2.0000 | INHALATION_SPRAY | Freq: Every day | RESPIRATORY_TRACT | 11 refills | Status: AC

## 2020-03-22 NOTE — Progress Notes (Signed)
Name: Robin Jones   MRN: [id number]    DOB: [birth year]   Date:03/22/2020       Progress Note  Chief Complaint  Patient presents with  . Hyperlipidemia    medication refills  . COPD     Subjective:   Robin Jones is a 69 y.o. female, presents to clinic for routine f/up  Hyperlipidemia: Currently treated with lipitor, pt reports good med compliance Last Lipids: Lab Results  Component Value Date   CHOL 178 05/23/2019   HDL 54 05/23/2019   LDLCALC 108 (H) 05/23/2019   TRIG 74 05/23/2019   CHOLHDL 3.3 05/23/2019   Hx of CAD, aortic atherosclerosis - Denies: Chest pain, shortness of breath, myalgias, claudication  On celexa 20 mg daily - phq positive, reviewed today, decreased since earlier this year. Depression screen [facility] 2/9 03/22/2020 09/16/2019 06/24/2019  Decreased Interest 2 3 2   Down, Depressed, Hopeless 2 3 2   PHQ - 2 Score 4 6 4   Altered sleeping 2 3 3   Tired, decreased energy 2 3 2   Change in appetite 2 3 3   Feeling bad or failure about yourself  1 0 1  Trouble concentrating 0 1 1  Moving slowly or fidgety/restless 0 1 2  Suicidal thoughts 0 0 0  PHQ-9 Score 11 17 16   Difficult doing work/chores Somewhat difficult Somewhat difficult Somewhat difficult  Some recent data might be hidden   Weight up a little  Wt Readings from Last 5 Encounters:  03/22/20 119 lb 3.2 oz (54.1 kg)  10/03/19 110 lb 3.7 oz (50 kg)  06/24/19 117 lb 3.2 oz (53.2 kg)  05/23/19 122 lb 11.2 oz (55.7 kg)  05/15/19 115 lb (52.2 kg)   BMI Readings from Last 5 Encounters:  03/22/20 20.46 kg/m  10/03/19 18.92 kg/m  06/24/19 20.12 kg/m  05/23/19 21.06 kg/m  05/15/19 19.74 kg/m   Health Maintenance  Topic Date Due  . MAMMOGRAM  11/27/2018  . INFLUENZA VACCINE  03/07/2020  . COVID-19 Vaccine (1) 04/07/2020 (Originally 09/05/1962)  . COLONOSCOPY  10/02/2024  . TETANUS/TDAP  03/02/2025  . DEXA SCAN  Completed  . Hepatitis C Screening  Completed  . PNA vac Low Risk Adult   Completed   She recently did colonscopy  Due for mammogram  Continues to smoke - smoking cessation discussed today- never connected with smoking cessation team, though they did try to contact her several times.  Last CT lung CA screening 05/2019 - aggressive appearing nodule in RUL - do not see the f/up, pt has been hard to reach by phone.  Reached out to lung CA/nodule RN - he notes that pulmonary was managing and monitoring. Dr Raul Del at [facility] is pt pulmonologist     Current Outpatient Medications:  .  acetaminophen (TYLENOL) 325 MG tablet, Take 650 mg by mouth every 6 (six) hours as needed for moderate pain or headache., Disp: , Rfl:  .  albuterol (VENTOLIN HFA) 108 (90 Base) MCG/ACT inhaler, Inhale 1-2 puffs into the lungs every 6 (six) hours as needed for wheezing or shortness of breath., Disp: 18 g, Rfl: 4 .  aspirin EC 81 MG tablet, Take 1 tablet (81 mg total) by mouth daily., Disp: , Rfl:  .  atorvastatin (LIPITOR) 80 MG tablet, TAKE 1 TABLET AT BEDTIME., Disp: 90 tablet, Rfl: 1 .  cholecalciferol (VITAMIN D) 1000 units tablet, Take 1,000 Units by mouth daily., Disp: , Rfl:  .  citalopram (CELEXA) 20 MG tablet, Take 1 tablet (20  mg total) by mouth daily., Disp: 30 tablet, Rfl: 0 .  fluticasone (FLONASE) 50 MCG/ACT nasal spray, Place 2 sprays into both nostrils daily., Disp: 16 g, Rfl: 2 .  loratadine (CLARITIN) 10 MG tablet, Take 1 tablet (10 mg total) by mouth daily., Disp: 30 tablet, Rfl: 5 .  Tiotropium Bromide Monohydrate (SPIRIVA RESPIMAT) 2.5 MCG/ACT AERS, Inhale 2 puffs into the lungs daily. Inhale 2 puffs daily into the lungs., Disp: 4 g, Rfl: 3 .  citalopram (CELEXA) 20 MG tablet, Take 1 tablet (20 mg total) by mouth daily. (Patient not taking: Reported on 03/22/2020), Disp: 90 tablet, Rfl: 1  Current Facility-Administered Medications:  .  albuterol (PROVENTIL) (2.5 MG/3ML) 0.083% nebulizer solution 2.5 mg, 2.5 mg, Nebulization, Once, [place], Bethel Born, NP  Patient  Active Problem List   Diagnosis Date Noted  . Personal history of colonic polyps   . Polyp of ascending colon   . Rectal polyp   . Thyroid nodule 06/18/2017  . Underweight 05/08/2017  . Hyperthyroidism 05/08/2017  . Coronary artery disease 10/03/2016  . Aortic atherosclerosis ([place]) 10/03/2016  . Centrilobular emphysema ([place]) 10/03/2016  . HAV (hallux abducto valgus) 03/26/2016  . Bunion of right foot 03/10/2016  . Skin lesion of left leg 03/10/2016  . Senile purpura ([place]) 03/10/2016  . Need for hepatitis C screening test 02/09/2016  . Presbycusis of both ears 02/09/2016  . Osteopenia 12/09/2015  . Insomnia 12/05/2015  . Medication monitoring encounter 11/11/2015  . Adenopathy   . Lung mass   . Lung nodule seen on imaging study 03/29/2015  . Right flank pain 03/19/2015  . GERD without esophagitis 03/03/2015  . Depression, major, recurrent, in partial remission ([place]) 03/03/2015  . CKD (chronic kidney disease) stage 2, GFR 60-89 ml/min 03/03/2015  . COPD, mild ([place]) 03/03/2015  . Tobacco abuse 03/03/2015  . Chronic constipation 03/03/2015  . Osteoarthritis of multiple joints 03/03/2015  . At risk for falls 03/03/2015  . Thyroid mass of unclear etiology 03/03/2015  . Hyperlipidemia LDL goal <70 03/03/2015  . Need for pneumococcal vaccination 03/03/2015  . Polyp of colon, adenomatous 03/03/2015  . Skin lesion of face 03/03/2015    Past Surgical History:  Procedure Laterality Date  . ABDOMINAL HYSTERECTOMY  1980   partial  . BACK SURGERY  1978  . COLONOSCOPY N/A 12/22/2014   Procedure: COLONOSCOPY;  Surgeon: Lucilla Lame, MD;  Location: [facility] ENDOSCOPY;  Service: Endoscopy;  Laterality: N/A;  . COLONOSCOPY WITH PROPOFOL N/A 10/03/2019   Procedure: COLONOSCOPY WITH PROPOFOL;  Surgeon: Lucilla Lame, MD;  Location: [facility] ENDOSCOPY;  Service: Endoscopy;  Laterality: N/A;  Priority 4  . ELECTROMAGNETIC NAVIGATION BROCHOSCOPY N/A 04/15/2015   Procedure: ELECTROMAGNETIC NAVIGATION  BRONCHOSCOPY;  Surgeon: Flora Lipps, MD;  Location: [facility] ORS;  Service: Cardiopulmonary;  Laterality: N/A;  . ENDOBRONCHIAL ULTRASOUND N/A 04/15/2015   Procedure: ENDOBRONCHIAL ULTRASOUND;  Surgeon: Flora Lipps, MD;  Location: [facility] ORS;  Service: Cardiopulmonary;  Laterality: N/A;  . ENDOBRONCHIAL ULTRASOUND N/A 06/15/2015   Procedure: ENDOBRONCHIAL ULTRASOUND;  Surgeon: Flora Lipps, MD;  Location: [facility] ORS;  Service: Cardiopulmonary;  Laterality: N/A;  . INNER EAR SURGERY Left 2013  . TONSILLECTOMY  11    Family History  Problem Relation Age of Onset  . Cancer Sister        liver  . Thyroid disease Father   . Heart disease Mother   . Hepatitis C Sister   . Thyroid disease Sister   . Cancer Brother 110  in muscle of leg  . Heart disease Brother   . Arthritis Brother   . COPD Sister   . Hypertension Sister   . Hyperlipidemia Sister   . Diabetes Maternal Grandmother   . Kidney disease Maternal Grandmother   . Arthritis Brother   . Breast cancer Maternal Aunt   . Breast cancer Maternal Aunt     Social History   Tobacco Use  . Smoking status: Current Every Day Smoker    Packs/day: 0.25    Years: 45.00    Pack years: 11.25    Types: Cigarettes  . Smokeless tobacco: Never Used  . Tobacco comment: only smoking 3-4 a day, trying to quit  Vaping Use  . Vaping Use: Never used  Substance Use Topics  . Alcohol use: No    Alcohol/week: 0.0 standard drinks  . Drug use: No     Allergies  Allergen Reactions  . Trelegy Ellipta [Fluticasone-Umeclidin-Vilant] Other (See Comments)    Pt states that this made her mouth and throat very sore, unable to tolerate     Health Maintenance  Topic Date Due  . MAMMOGRAM  11/27/2018  . INFLUENZA VACCINE  03/07/2020  . COVID-19 Vaccine (1) 04/07/2020 (Originally 09/05/1962)  . COLONOSCOPY  10/02/2024  . TETANUS/TDAP  03/02/2025  . DEXA SCAN  Completed  . Hepatitis C Screening  Completed  . PNA vac Low Risk Adult  Completed    Chart  Review Today: I personally reviewed active problem list, medication list, allergies, family history, social history, health maintenance, notes from last encounter, lab results, imaging with the patient/caregiver today.  Review of Systems  10 Systems reviewed and are negative for acute change except as noted in the HPI.  Objective:   Vitals:   03/22/20 1035  BP: 136/78  Pulse: 88  Resp: 16  Temp: 98 F (36.7 C)  TempSrc: Oral  SpO2: 99%  Weight: 119 lb 3.2 oz (54.1 kg)  Height: 5\' 4"  (1.626 m)    Body mass index is 20.46 kg/m.  Physical Exam Vitals and nursing note reviewed.  Constitutional:      General: She is not in acute distress.    Appearance: She is well-developed. She is not toxic-appearing or diaphoretic.     Interventions: Face mask in place.  HENT:     Head: Normocephalic and atraumatic.     Right Ear: External ear normal.     Left Ear: External ear normal.  Eyes:     General: No scleral icterus.       Right eye: No discharge.        Left eye: No discharge.  Cardiovascular:     Rate and Rhythm: Normal rate and regular rhythm.     Pulses: Normal pulses.     Heart sounds: Normal heart sounds. No murmur heard.  No friction rub. No gallop.   Pulmonary:     Effort: Pulmonary effort is normal. No respiratory distress.     Breath sounds: Decreased breath sounds present.     Comments: Increased A/P diameter Abdominal:     General: Bowel sounds are normal.     Palpations: Abdomen is soft.  Musculoskeletal:     Right lower leg: No edema.     Left lower leg: No edema.  Skin:    General: Skin is warm and dry.     Coloration: Skin is not jaundiced or pale.  Neurological:     Mental Status: She is alert. Mental status is at baseline.  Psychiatric:        Mood and Affect: Mood normal.        Behavior: Behavior normal. Behavior is cooperative.         Assessment & Plan:     ICD-10-CM   1. Hyperlipidemia, unspecified hyperlipidemia type  E78.5 COMPLETE  METABOLIC PANEL WITH GFR    Lipid panel    atorvastatin (LIPITOR) 80 MG tablet   compliant with statin, no myalgias or SE, recheck labs  2. Aortic atherosclerosis (HCC) Chronic H06.2 COMPLETE METABOLIC PANEL WITH GFR    Lipid panel   on statin, monitoring  3. Coronary artery disease involving native coronary artery of native heart with angina pectoris (HCC) Chronic B76.283 COMPLETE METABOLIC PANEL WITH GFR    Lipid panel   on statin - monitoring sx, seeing cardiology  4. Allergic rhinitis, unspecified seasonality, unspecified trigger  J30.9 fluticasone (FLONASE) 50 MCG/ACT nasal spray    loratadine (CLARITIN) 10 MG tablet   allergies worse - start meds daily while sx, avoid triggers/allergens as able  5. COPD, mild (HCC) Chronic J44.9 albuterol (VENTOLIN HFA) 108 (90 Base) MCG/ACT inhaler    Tiotropium Bromide Monohydrate (SPIRIVA RESPIMAT) 2.5 MCG/ACT AERS   per pulmonary - pt compliant with meds, expresses desire to stop smoking, no change to sx  6. Hyperthyroidism  E05.90 TSH    T4, free   no weight loss, palpitations, heat intolerance, recheck and monitor labs  7. Moderate episode of recurrent major depressive disorder (HCC) Chronic F33.1 citalopram (CELEXA) 20 MG tablet   still symptomatic, but slightly improved from past OV, continue celexa  8. Senile purpura (HCC) Chronic D69.2    stable  9. Osteopenia, unspecified location  T51.76 COMPLETE METABOLIC PANEL WITH GFR   monitor labs/calcium  10. CKD (chronic kidney disease) stage 2, GFR 60-89 ml/min  H60.7 COMPLETE METABOLIC PANEL WITH GFR   monitor renal function  11. Centrilobular emphysema (HCC)  J43.2 albuterol (VENTOLIN HFA) 108 (90 Base) MCG/ACT inhaler    Tiotropium Bromide Monohydrate (SPIRIVA RESPIMAT) 2.5 MCG/ACT AERS    CT Chest High Resolution   per pulmonary - [facility]- overdue for f/up  12. Tobacco abuse  Z72.0 CT Chest High Resolution   smoking cessation discused again today - given name and number of RN with [facility]  stop smoking resources   13. Nodule of upper lobe of right lung  R91.1 CT Chest High Resolution   f/up CT HS - will need to coordinate with lung nodule/lung CA team and CT surgeon/oncology team.  14. Medication monitoring encounter  Z51.81 CBC with Differential/Platelet    COMPLETE METABOLIC PANEL WITH GFR    Lipid panel    TSH    T4, free  15. Abnormal screening CT of chest  R93.89 CT Chest High Resolution   cannot see any visit or notes per pulm at [facility] in the past 18 months, will order high resolution CT - get pt to CT surgery/oncology or back to pulm prn   I reviewed past CT chest - discussed with Burgess Estelle with lung nodule team - he noted that pulmonary reviewed and was managing.  Pt is difficult to get ahold of.  Will need to see if per Pulm they want to procedure with high resolution chest CT or not?  Past Ct 05/15/2019: Lungs/Pleura: In addition to multiple small calcified and noncalcified pulmonary nodules in the lungs bilaterally there is a large aggressive appearing nodule in the apex of the right upper lobe (axial image 42 of series  3), with a volume derived mean diameter of 15.7 mm. No acute consolidative airspace disease. No pleural effusions. Mild diffuse bronchial wall thickening with mild centrilobular and paraseptal emphysema.  Smoking cessation instruction/counseling given:  counseled patient on the dangers of tobacco use, advised patient to stop smoking, and reviewed strategies to maximize success  Return in about 6 months (around 09/22/2020) for Routine follow-up.   Delsa Grana, PA-C 03/22/20 11:00 AM

## 2020-03-22 NOTE — Patient Instructions (Addendum)
Call Burgess Estelle about the stop smoking program and resources  [phone]     Steps to Quit Smoking Smoking tobacco is the leading cause of preventable death. It can affect almost every organ in the body. Smoking puts you and people around you at risk for many serious, long-lasting (chronic) diseases. Quitting smoking can be hard, but it is one of the best things that you can do for your health. It is never too late to quit. How do I get ready to quit? When you decide to quit smoking, make a plan to help you succeed. Before you quit:  Pick a date to quit. Set a date within the next 2 weeks to give you time to prepare.  Write down the reasons why you are quitting. Keep this list in places where you will see it often.  Tell your family, friends, and co-workers that you are quitting. Their support is important.  Talk with your doctor about the choices that may help you quit.  Find out if your health insurance will pay for these treatments.  Know the people, places, things, and activities that make you want to smoke (triggers). Avoid them. What first steps can I take to quit smoking?  Throw away all cigarettes at home, at work, and in your car.  Throw away the things that you use when you smoke, such as ashtrays and lighters.  Clean your car. Make sure to empty the ashtray.  Clean your home, including curtains and carpets. What can I do to help me quit smoking? Talk with your doctor about taking medicines and seeing a counselor at the same time. You are more likely to succeed when you do both.  If you are pregnant or breastfeeding, talk with your doctor about counseling or other ways to quit smoking. Do not take medicine to help you quit smoking unless your doctor tells you to do so. To quit smoking: Quit right away  Quit smoking totally, instead of slowly cutting back on how much you smoke over a period of time.  Go to counseling. You are more likely to quit if you go to  counseling sessions regularly. Take medicine You may take medicines to help you quit. Some medicines need a prescription, and some you can buy over-the-counter. Some medicines may contain a drug called nicotine to replace the nicotine in cigarettes. Medicines may:  Help you to stop having the desire to smoke (cravings).  Help to stop the problems that come when you stop smoking (withdrawal symptoms). Your doctor may ask you to use:  Nicotine patches, gum, or lozenges.  Nicotine inhalers or sprays.  Non-nicotine medicine that is taken by mouth. Find resources Find resources and other ways to help you quit smoking and remain smoke-free after you quit. These resources are most helpful when you use them often. They include:  Online chats with a Social worker.  Phone quitlines.  Printed Furniture conservator/restorer.  Support groups or group counseling.  Text messaging programs.  Mobile phone apps. Use apps on your mobile phone or tablet that can help you stick to your quit plan. There are many free apps for mobile phones and tablets as well as websites. Examples include Quit Guide from the [employer] and smokefree.gov  What things can I do to make it easier to quit?   Talk to your family and friends. Ask them to support and encourage you.  Call a phone quitline (1-800-QUIT-NOW), reach out to support groups, or work with a Social worker.  Ask people  who smoke to not smoke around you.  Avoid places that make you want to smoke, such as: ? Bars. ? Parties. ? Smoke-break areas at work.  Spend time with people who do not smoke.  Lower the stress in your life. Stress can make you want to smoke. Try these things to help your stress: ? Getting regular exercise. ? Doing deep-breathing exercises. ? Doing yoga. ? Meditating. ? Doing a body scan. To do this, close your eyes, focus on one area of your body at a time from head to toe. Notice which parts of your body are tense. Try to relax the muscles in those  areas. How will I feel when I quit smoking? Day 1 to 3 weeks Within the first 24 hours, you may start to have some problems that come from quitting tobacco. These problems are very bad 2-3 days after you quit, but they do not often last for more than 2-3 weeks. You may get these symptoms:  Mood swings.  Feeling restless, nervous, angry, or annoyed.  Trouble concentrating.  Dizziness.  Strong desire for high-sugar foods and nicotine.  Weight gain.  Trouble pooping (constipation).  Feeling like you may vomit (nausea).  Coughing or a sore throat.  Changes in how the medicines that you take for other issues work in your body.  Depression.  Trouble sleeping (insomnia). Week 3 and afterward After the first 2-3 weeks of quitting, you may start to notice more positive results, such as:  Better sense of smell and taste.  Less coughing and sore throat.  Slower heart rate.  Lower blood pressure.  Clearer skin.  Better breathing.  Fewer sick days. Quitting smoking can be hard. Do not give up if you fail the first time. Some people need to try a few times before they succeed. Do your best to stick to your quit plan, and talk with your doctor if you have any questions or concerns. Summary  Smoking tobacco is the leading cause of preventable death. Quitting smoking can be hard, but it is one of the best things that you can do for your health.  When you decide to quit smoking, make a plan to help you succeed.  Quit smoking right away, not slowly over a period of time.  When you start quitting, seek help from your doctor, family, or friends. This information is not intended to replace advice given to you by your health care provider. Make sure you discuss any questions you have with your health care provider. Document Revised: 04/18/2019 Document Reviewed: 10/12/2018 Elsevier Patient Education  [place].

## 2020-03-24 ENCOUNTER — Telehealth: Payer: Self-pay

## 2020-03-24 DIAGNOSIS — E162 Hypoglycemia, unspecified: Secondary | ICD-10-CM

## 2020-03-24 MED ORDER — ACCU-CHEK SOFTCLIX LANCETS MISC: 12 refills | Status: AC

## 2020-03-24 MED ORDER — ACCU-CHEK GUIDE ME W/DEVICE KIT: 1.0000 | PACK | Freq: Three times a day (TID) | 0 refills | Status: AC

## 2020-03-24 MED ORDER — ACCU-CHEK GUIDE VI STRP: ORAL_STRIP | 12 refills | Status: AC

## 2020-03-24 NOTE — Telephone Encounter (Signed)
-----   Message from Delsa Grana, [place] sent at 03/22/2020  6:43 PM EDT ----- Second time pt had low blood sugar - please send in meter, lancets and strips for hypoglycemia -I suspect that for [facility] Medicare patient will need each of them prescribed separately, patient will be encouraged to check her blood sugar 3 times a day please review hypoglycemic sx and tx with her (see handout in office)   We will need to have her see endocrinology for further work up  Please put in referral to endo for hypoglycemia

## 2020-03-24 NOTE — Telephone Encounter (Signed)
-----   Message from Delsa Grana, [place] sent at 03/22/2020  6:43 PM EDT ----- Second time pt had low blood sugar - please send in meter, lancets and strips for hypoglycemia -I suspect that for [facility] Medicare patient will need each of them prescribed separately, patient will be encouraged to check her blood sugar 3 times a day please review hypoglycemic sx and tx with her (see handout in office)   We will need to have her see endocrinology for further work up  Please put in referral to endo for hypoglycemia

## 2020-03-31 ENCOUNTER — Telehealth: Payer: Self-pay | Admitting: *Deleted

## 2020-03-31 NOTE — Telephone Encounter (Signed)
Left message with patient's brother as patient was not awake. Requested patient to call back to discuss smoking cessation and follow up imaging of lung findings.

## 2020-04-07 ENCOUNTER — Encounter: Payer: Self-pay | Admitting: Family Medicine

## 2020-04-07 ENCOUNTER — Telehealth: Payer: Self-pay

## 2020-04-07 NOTE — Telephone Encounter (Signed)
I contacted this patient to inform her that she has been scheduled to have her CT on Monday, April 19, 2020 at 3pm at the [facility] but there was no answer. A message was left for her with this information on her voicemail as well as the number to centralized scheduling [phone]) in case she needed to cancel or reschedule.  A CRM will be placed.

## 2020-04-16 NOTE — Telephone Encounter (Signed)
Called patient. NA.

## 2020-04-19 ENCOUNTER — Ambulatory Visit: Admission: RE | Admit: 2020-04-19 | Payer: Medicare HMO | Source: Ambulatory Visit

## 2020-04-19 NOTE — Telephone Encounter (Signed)
Called patient. NA.

## 2020-04-20 NOTE — Telephone Encounter (Signed)
Called NA 

## 2020-07-07 DIAGNOSIS — E162 Hypoglycemia, unspecified: Secondary | ICD-10-CM | POA: Diagnosis not present

## 2020-07-08 DIAGNOSIS — E162 Hypoglycemia, unspecified: Secondary | ICD-10-CM | POA: Diagnosis not present

## 2020-09-16 ENCOUNTER — Ambulatory Visit: Payer: Medicare HMO

## 2020-09-23 ENCOUNTER — Ambulatory Visit: Payer: Medicare HMO | Admitting: Family Medicine

## 2020-09-24 ENCOUNTER — Telehealth: Payer: Self-pay | Admitting: Family Medicine

## 2020-09-24 NOTE — Telephone Encounter (Signed)
Copied from [place] [id number]. Topic: Medicare AWV >> Sep 24, 2020  1:20 PM Cher Nakai R wrote: Reason for CRM:  No answer unable to leave a message for patient to call back and schedule Medicare Annual Wellness Visit (AWV) in office.   If unable to come into the office for AWV,  please offer to do virtually or by telephone.  Last AWV: 09/16/2019  Please schedule at anytime with [place].  40 minute appointment  Any questions, please contact me at [phone]

## 2020-11-09 ENCOUNTER — Telehealth: Payer: Self-pay | Admitting: Family Medicine

## 2020-11-09 NOTE — Telephone Encounter (Signed)
Copied from [place] [id number]. Topic: Medicare AWV >> Nov 09, 2020  3:20 PM Cher Nakai R wrote: Reason for CRM:   Left message for patient to call back and schedule Medicare Annual Wellness Visit (AWV) in office.   If unable to come into the office for AWV,  please offer to do virtually or by telephone.  Last AWV: 09/16/2019  Please schedule at anytime with [place].  40 minute appointment  Any questions, please contact me at [phone]

## 2020-11-30 ENCOUNTER — Telehealth: Payer: Self-pay | Admitting: Family Medicine

## 2020-11-30 NOTE — Telephone Encounter (Signed)
Copied from [place] [id number]. Topic: Medicare AWV >> Nov 30, 2020  9:30 AM Cher Nakai R wrote: Reason for CRM:   Left message for patient to call back and schedule Medicare Annual Wellness Visit (AWV) in office.   If unable to come into the office for AWV,  please offer to do virtually or by telephone.  Last AWV: 09/16/2019  Please schedule at anytime with [place].  40 minute appointment  Any questions, please contact me at [phone]

## 2020-12-16 ENCOUNTER — Other Ambulatory Visit: Payer: Self-pay | Admitting: Family Medicine

## 2020-12-16 DIAGNOSIS — Z1231 Encounter for screening mammogram for malignant neoplasm of breast: Secondary | ICD-10-CM

## 2020-12-21 ENCOUNTER — Other Ambulatory Visit: Payer: Self-pay

## 2020-12-21 ENCOUNTER — Ambulatory Visit
Admission: RE | Admit: 2020-12-21 | Discharge: 2020-12-21 | Disposition: A | Discharge status: Home / Self Care | Payer: Medicare HMO | Source: Ambulatory Visit | Attending: Family Medicine | Admitting: Family Medicine

## 2020-12-21 DIAGNOSIS — Z1231 Encounter for screening mammogram for malignant neoplasm of breast: Secondary | ICD-10-CM | POA: Diagnosis not present

## 2021-03-11 ENCOUNTER — Telehealth: Payer: Self-pay | Admitting: Family Medicine

## 2021-03-11 NOTE — Telephone Encounter (Signed)
Copied from [place] [phone]. Topic: Medicare AWV >> Mar 11, 2021  2:22 PM Cher Nakai R wrote: Reason for CRM:  Left message for patient to call back and schedule Medicare Annual Wellness Visit (AWV) in office.   If unable to come into the office for AWV,  please offer to do virtually or by telephone.  Last AWV:  09/16/2019  Please schedule at anytime with [place].  40 minute appointment  Any questions, please contact me at [phone]

## 2021-04-14 ENCOUNTER — Telehealth: Payer: Self-pay | Admitting: Family Medicine

## 2021-04-14 DIAGNOSIS — F331 Major depressive disorder, recurrent, moderate: Secondary | ICD-10-CM

## 2021-04-14 DIAGNOSIS — J309 Allergic rhinitis, unspecified: Secondary | ICD-10-CM

## 2021-04-14 NOTE — Telephone Encounter (Signed)
Pt was given 30 day supply but needs an appt for further refills.

## 2021-04-15 NOTE — Telephone Encounter (Signed)
Lvm for pt to call and schedule an appt  

## 2021-07-13 IMAGING — CT CT CHEST LUNG CANCER SCREENING LOW DOSE W/O CM
1 of 2 series · 14 of 32 positions shown, 18 images · non-contrast
Comparison: Low-dose lung cancer screening chest CT 03/30/2018.

CLINICAL DATA: 68-year-old female current smoker with 46 pack year
history of smoking. Lung cancer screening examination.

EXAM:
CT CHEST WITHOUT CONTRAST LOW-DOSE FOR LUNG CANCER SCREENING
TECHNIQUE: Multidetector CT imaging of the chest was performed following the
standard protocol without IV contrast.

[Series 3: lung · axial · 0.62mm/px · z∈[-1162,-879]mm · 14 of 313 slices shown, 18 images]
[im 15/313  mediastinal]
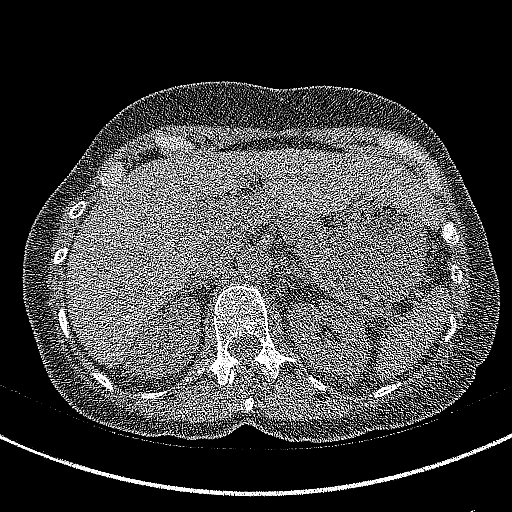
[im 15/313  lung]
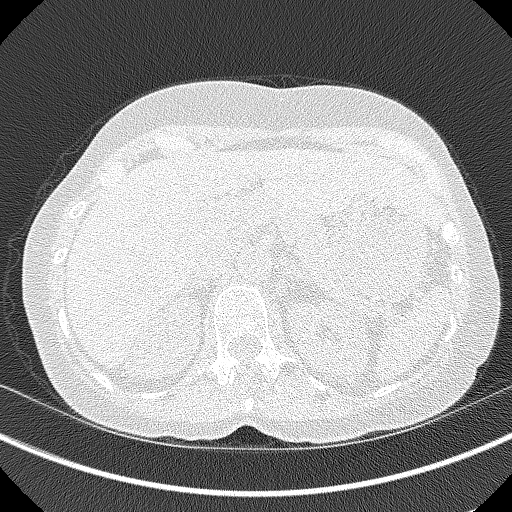
[im 43/313  lung]
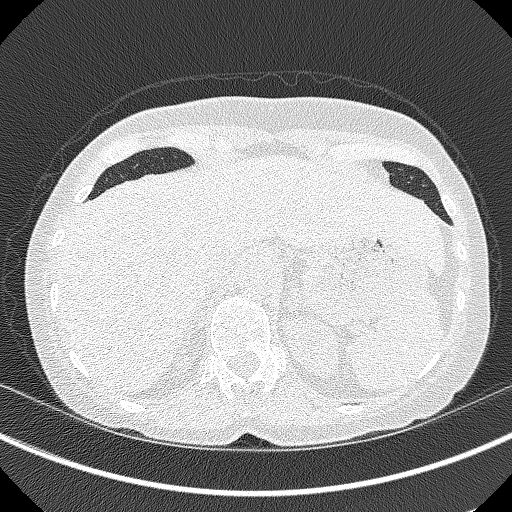
[im 71/313  lung]
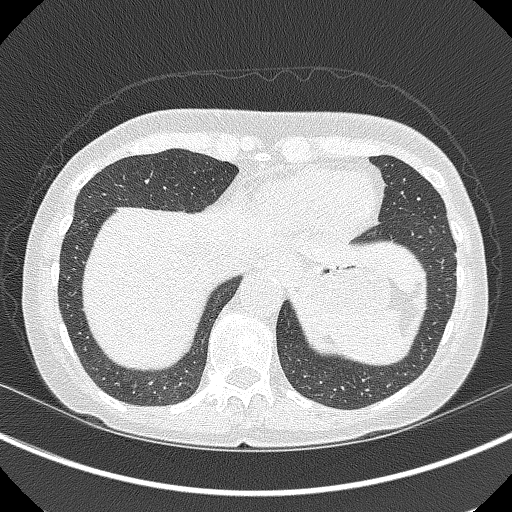
[im 86/313  lung]
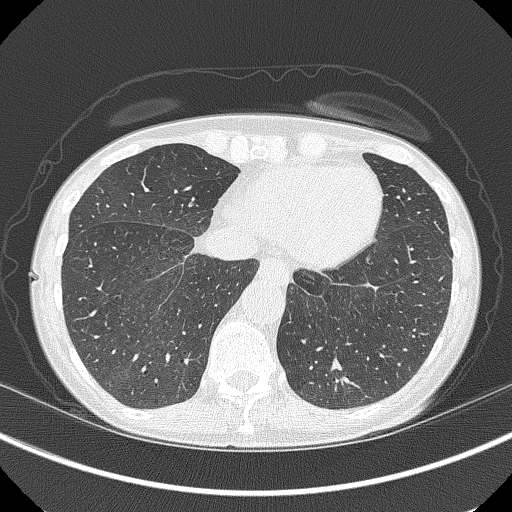
[im 100/313  mediastinal]
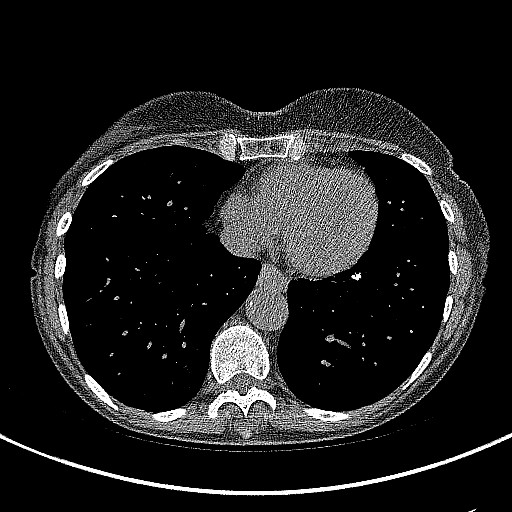
[im 100/313  lung]
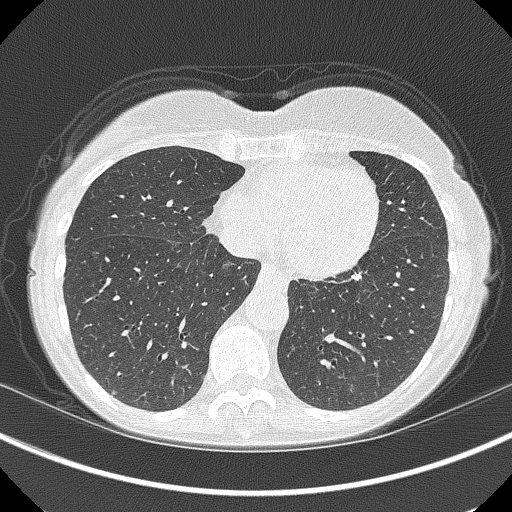
[im 128/313  lung]
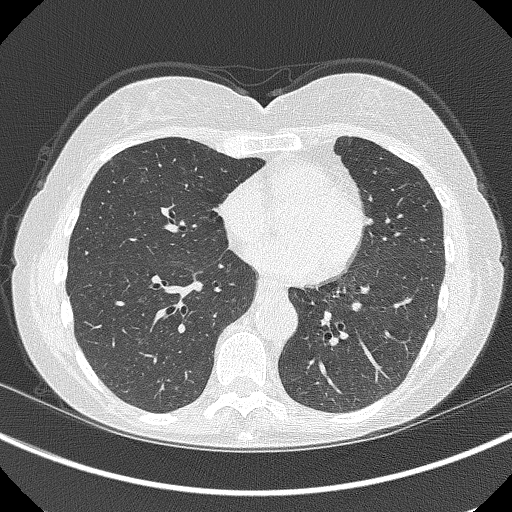
[im 147/313  lung]
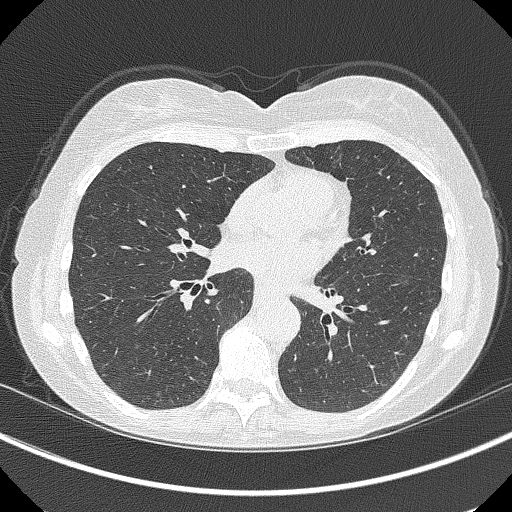
[im 157/313  lung]
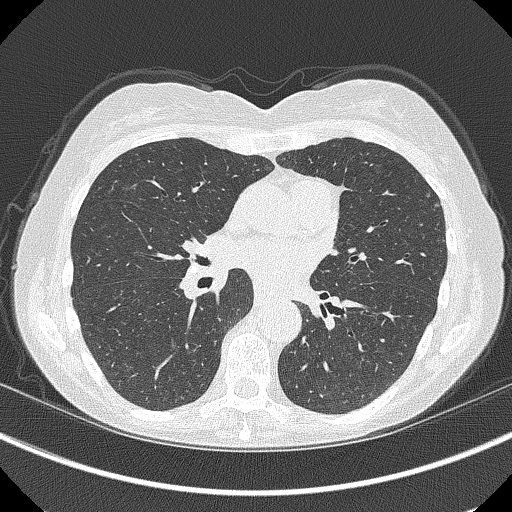
[im 185/313  mediastinal]
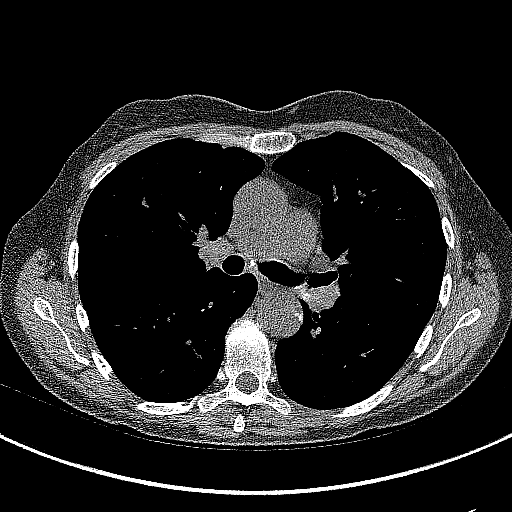
[im 185/313  lung]
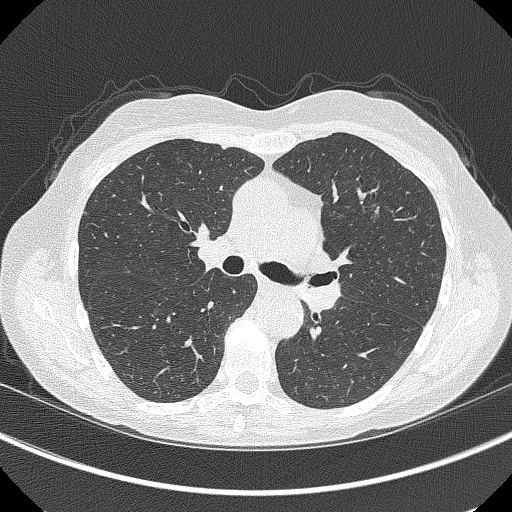
[im 213/313  lung]
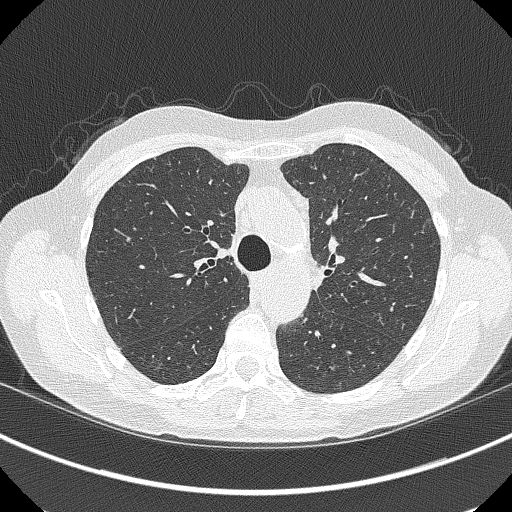
[im 235/313  lung]
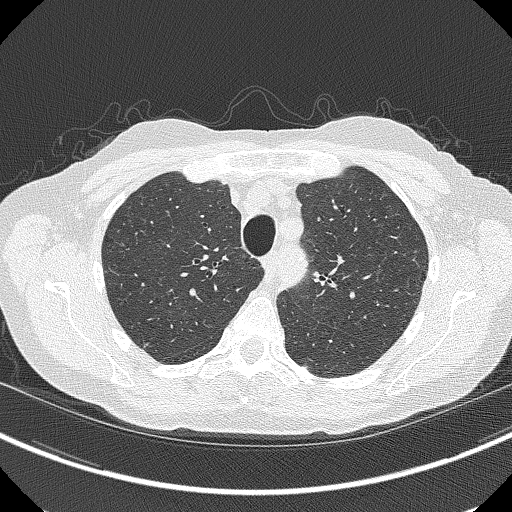
[im 242/313  lung]
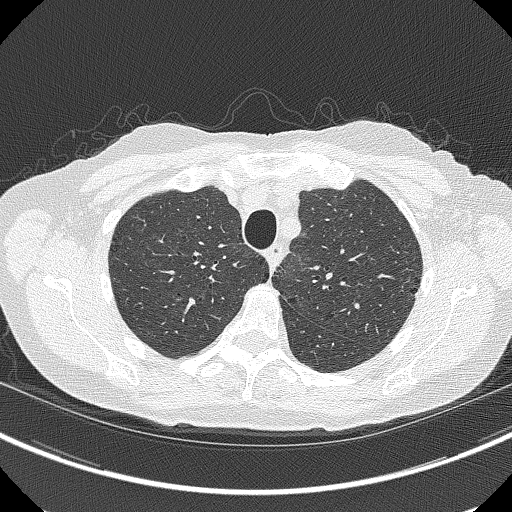
[im 270/313  mediastinal]
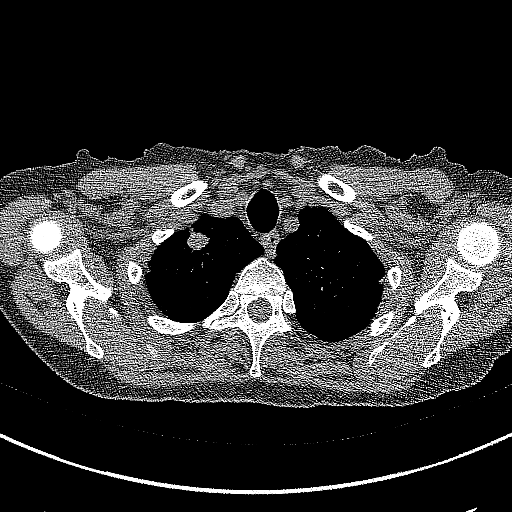
[im 270/313  lung]
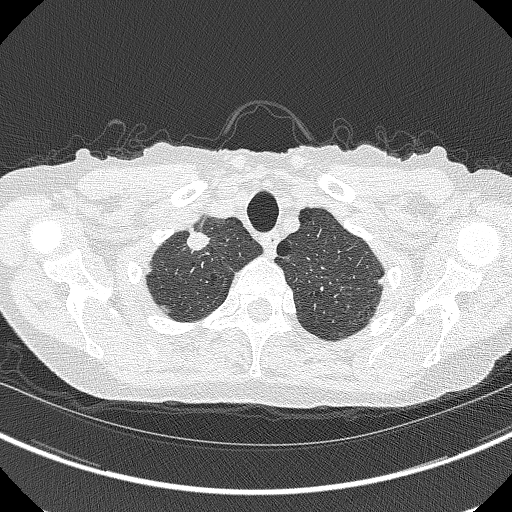
[im 298/313  lung]
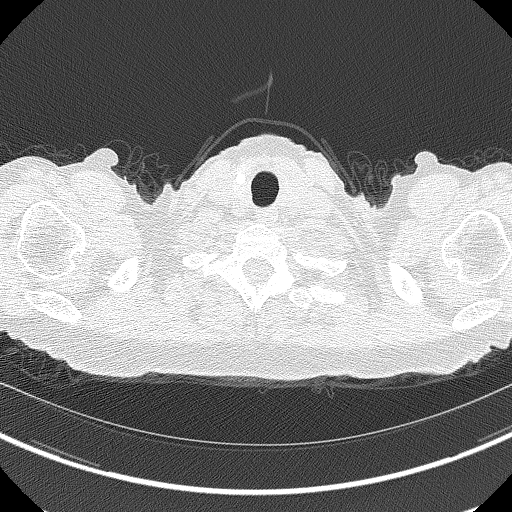

[14 of 32 positions shown; findings below may reference images not displayed]

FINDINGS: Cardiovascular: Heart size is normal. There is no significant
pericardial fluid, thickening or pericardial calcification. There is
aortic atherosclerosis, as well as atherosclerosis of the great
vessels of the mediastinum and the coronary arteries, including
calcified atherosclerotic plaque in the left main, left anterior
descending and left circumflex coronary arteries.

Mediastinum/Nodes: No pathologically enlarged mediastinal or hilar
lymph nodes. Please note that accurate exclusion of hilar adenopathy
is limited on noncontrast CT scans. Multiple densely calcified
mediastinal and bilateral hilar lymph nodes are incidentally noted.
Esophagus is unremarkable in appearance. No axillary
lymphadenopathy.

Lungs/Pleura: In addition to multiple small calcified and
noncalcified pulmonary nodules in the lungs bilaterally there is a
large aggressive appearing nodule in the apex of the right upper
lobe (axial image 42 of series 3), with a volume derived mean
diameter of 15.7 mm. No acute consolidative airspace disease. No
pleural effusions. Mild diffuse bronchial wall thickening with mild
centrilobular and paraseptal emphysema.

Upper Abdomen: Calcified granulomas throughout the liver and spleen.
Aortic atherosclerosis.

Musculoskeletal: There are no aggressive appearing lytic or blastic
lesions noted in the visualized portions of the skeleton.
IMPRESSION: 1. Lung-RADS 4BS, suspicious. Additional imaging evaluation or
consultation with Pulmonology or Thoracic Surgery recommended.
2. The "S" modifier above refers to potentially clinically
significant non lung cancer related findings. Specifically, there is
aortic atherosclerosis, in addition to left main and 2 vessel
coronary artery disease. Please note that although the presence of
coronary artery calcium documents the presence of coronary artery
disease, the severity of this disease and any potential stenosis
cannot be assessed on this non-gated CT examination. Assessment for
potential risk factor modification, dietary therapy or pharmacologic
therapy may be warranted, if clinically indicated.
3. Mild diffuse bronchial wall thickening with mild centrilobular
and paraseptal emphysema; imaging findings suggestive of underlying
COPD.

These results will be called to the ordering clinician or
representative by the Radiologist Assistant, and communication
documented in the PACS or zVision Dashboard.

Aortic Atherosclerosis (TP5XE-1HS.S) and Emphysema (TP5XE-JLN.Q).

## 2023-06-08 DEATH — deceased
